# Patient Record
Sex: Male | Born: 1965 | Race: White | Hispanic: No | Smoking: Former smoker
Health system: Southern US, Community
[De-identification: ages and names within clinical notes are randomized; demographics above are authoritative.]

## PROBLEM LIST (undated history)

## (undated) DIAGNOSIS — F419 Anxiety disorder, unspecified: Secondary | ICD-10-CM

## (undated) DIAGNOSIS — M1990 Unspecified osteoarthritis, unspecified site: Secondary | ICD-10-CM

## (undated) DIAGNOSIS — J329 Chronic sinusitis, unspecified: Secondary | ICD-10-CM

## (undated) DIAGNOSIS — M542 Cervicalgia: Secondary | ICD-10-CM

## (undated) DIAGNOSIS — G8929 Other chronic pain: Secondary | ICD-10-CM

## (undated) DIAGNOSIS — K029 Dental caries, unspecified: Secondary | ICD-10-CM

## (undated) DIAGNOSIS — M549 Dorsalgia, unspecified: Secondary | ICD-10-CM

## (undated) HISTORY — PX: VASECTOMY: SHX75

## (undated) HISTORY — PX: TOOTH EXTRACTION: SUR596

---

## 2001-02-15 ENCOUNTER — Emergency Department (HOSPITAL_COMMUNITY): Admission: EM | Admit: 2001-02-15 | Discharge: 2001-02-15 | Payer: Self-pay | Admitting: Emergency Medicine

## 2001-03-31 ENCOUNTER — Emergency Department (HOSPITAL_COMMUNITY): Admission: EM | Admit: 2001-03-31 | Discharge: 2001-03-31 | Payer: Self-pay | Admitting: Emergency Medicine

## 2001-12-10 ENCOUNTER — Emergency Department (HOSPITAL_COMMUNITY): Admission: EM | Admit: 2001-12-10 | Discharge: 2001-12-10 | Payer: Self-pay | Admitting: Emergency Medicine

## 2001-12-10 ENCOUNTER — Encounter: Payer: Self-pay | Admitting: Emergency Medicine

## 2002-01-14 ENCOUNTER — Ambulatory Visit (HOSPITAL_COMMUNITY): Admission: RE | Admit: 2002-01-14 | Discharge: 2002-01-14 | Payer: Self-pay | Admitting: General Surgery

## 2002-01-14 ENCOUNTER — Encounter: Payer: Self-pay | Admitting: General Surgery

## 2003-01-21 ENCOUNTER — Encounter (HOSPITAL_COMMUNITY): Admission: RE | Admit: 2003-01-21 | Discharge: 2003-02-20 | Payer: Self-pay | Admitting: Family Medicine

## 2003-10-06 ENCOUNTER — Emergency Department (HOSPITAL_COMMUNITY): Admission: EM | Admit: 2003-10-06 | Discharge: 2003-10-06 | Payer: Self-pay | Admitting: Emergency Medicine

## 2004-07-24 ENCOUNTER — Emergency Department (HOSPITAL_COMMUNITY): Admission: EM | Admit: 2004-07-24 | Discharge: 2004-07-24 | Payer: Self-pay | Admitting: Emergency Medicine

## 2005-12-03 ENCOUNTER — Emergency Department (HOSPITAL_COMMUNITY): Admission: EM | Admit: 2005-12-03 | Discharge: 2005-12-03 | Payer: Self-pay | Admitting: Emergency Medicine

## 2007-06-15 ENCOUNTER — Emergency Department (HOSPITAL_COMMUNITY): Admission: EM | Admit: 2007-06-15 | Discharge: 2007-06-15 | Payer: Self-pay | Admitting: Emergency Medicine

## 2007-09-15 ENCOUNTER — Emergency Department (HOSPITAL_COMMUNITY): Admission: EM | Admit: 2007-09-15 | Discharge: 2007-09-15 | Payer: Self-pay | Admitting: Emergency Medicine

## 2008-03-27 ENCOUNTER — Emergency Department (HOSPITAL_COMMUNITY): Admission: EM | Admit: 2008-03-27 | Discharge: 2008-03-27 | Payer: Self-pay | Admitting: Emergency Medicine

## 2008-10-17 ENCOUNTER — Emergency Department (HOSPITAL_COMMUNITY): Admission: EM | Admit: 2008-10-17 | Discharge: 2008-10-17 | Payer: Self-pay | Admitting: Emergency Medicine

## 2009-04-13 ENCOUNTER — Emergency Department (HOSPITAL_COMMUNITY): Admission: EM | Admit: 2009-04-13 | Discharge: 2009-04-13 | Payer: Self-pay | Admitting: Emergency Medicine

## 2011-02-27 ENCOUNTER — Emergency Department (HOSPITAL_COMMUNITY)
Admission: EM | Admit: 2011-02-27 | Discharge: 2011-02-27 | Disposition: A | Discharge status: Home / Self Care | Payer: Medicare Other | Attending: Emergency Medicine | Admitting: Emergency Medicine

## 2011-02-27 ENCOUNTER — Encounter (HOSPITAL_COMMUNITY): Payer: Self-pay | Admitting: Emergency Medicine

## 2011-02-27 ENCOUNTER — Emergency Department (HOSPITAL_COMMUNITY): Payer: Medicare Other

## 2011-02-27 DIAGNOSIS — J3489 Other specified disorders of nose and nasal sinuses: Secondary | ICD-10-CM | POA: Insufficient documentation

## 2011-02-27 DIAGNOSIS — R059 Cough, unspecified: Secondary | ICD-10-CM | POA: Insufficient documentation

## 2011-02-27 DIAGNOSIS — Z79899 Other long term (current) drug therapy: Secondary | ICD-10-CM | POA: Insufficient documentation

## 2011-02-27 DIAGNOSIS — F411 Generalized anxiety disorder: Secondary | ICD-10-CM | POA: Insufficient documentation

## 2011-02-27 DIAGNOSIS — R05 Cough: Secondary | ICD-10-CM | POA: Insufficient documentation

## 2011-02-27 DIAGNOSIS — M129 Arthropathy, unspecified: Secondary | ICD-10-CM | POA: Insufficient documentation

## 2011-02-27 HISTORY — DX: Chronic sinusitis, unspecified: J32.9

## 2011-02-27 HISTORY — DX: Unspecified osteoarthritis, unspecified site: M19.90

## 2011-02-27 HISTORY — DX: Anxiety disorder, unspecified: F41.9

## 2011-02-27 MED ORDER — BENZONATATE 100 MG PO CAPS: ORAL_CAPSULE | ORAL | Status: DC

## 2011-02-27 MED ORDER — AZITHROMYCIN 250 MG PO TABS: ORAL_TABLET | ORAL | Status: DC

## 2011-02-27 MED ORDER — ALBUTEROL SULFATE HFA 108 (90 BASE) MCG/ACT IN AERS
2.0000 | INHALATION_SPRAY | Freq: Once | RESPIRATORY_TRACT | Status: AC
  Administered 2011-02-27: 2 via RESPIRATORY_TRACT
  Filled 2011-02-27: qty 6.7

## 2011-02-27 NOTE — ED Notes (Signed)
Pt c/o cough/congestion. Has green prod cough. No resp distress noted. Nad.

## 2011-02-28 NOTE — ED Provider Notes (Signed)
History     CSN: [id number]  Arrival date & time 02/27/11  4098   First MD Initiated Contact with Patient 02/27/11 1016      Chief Complaint  Patient presents with  . Cough    (Consider location/radiation/quality/duration/timing/severity/associated sxs/prior treatment) Patient is a 46 y.o. male presenting with cough. The history is provided by the patient. No language interpreter was used.  Cough This is a new problem. The current episode started more than 2 days ago. The problem occurs every few minutes. The problem has not changed since onset.The cough is productive of sputum. There has been no fever. Associated symptoms include rhinorrhea. Pertinent negatives include no chest pain, no chills, no sweats, no ear pain, no headaches, no sore throat, no myalgias, no shortness of breath and no wheezing. Associated symptoms comments: Nasal congestion. He has tried nothing for the symptoms. The treatment provided no relief. He is not a smoker. His past medical history does not include pneumonia, COPD or asthma.    Past Medical History  Diagnosis Date  . Arthritis   . Anxiety   . Sinusitis     Past Surgical History  Procedure Date  . Vasectomy     History reviewed. No pertinent family history.  History  Substance Use Topics  . Smoking status: Former Games developer  . Smokeless tobacco: Former Neurosurgeon  . Alcohol Use: No      Review of Systems  Constitutional: Negative for fever, chills and appetite change.  HENT: Positive for rhinorrhea. Negative for ear pain, sore throat and neck pain.   Respiratory: Positive for cough. Negative for chest tightness, shortness of breath and wheezing.   Cardiovascular: Negative for chest pain.  Gastrointestinal: Negative for vomiting, abdominal pain and diarrhea.  Musculoskeletal: Negative for myalgias.  Skin: Negative.   Neurological: Negative for dizziness, weakness, numbness and headaches.  All other systems reviewed and are  negative.    Allergies  Review of patient's allergies indicates not on file.  Home Medications   Current Outpatient Rx  Name Route Sig Dispense Refill  . AZITHROMYCIN 250 MG PO TABS  Take two tablets on day one, then one tab qd days 2-5 6 tablet 0  . BENZONATATE 100 MG PO CAPS  Take one po TID prn cough. Swallow whole, do not chew. 21 capsule 0    BP 130/95  Pulse 85  Temp(Src) 98.1 F (36.7 C) (Oral)  Resp 19  SpO2 100%  Physical Exam  Nursing note and vitals reviewed. Constitutional: He is oriented to person, place, and time. He appears well-developed and well-nourished. No distress.  HENT:  Head: Normocephalic and atraumatic.  Mouth/Throat: Oropharynx is clear and moist. No oropharyngeal exudate.  Neck: Normal range of motion. Neck supple.  Cardiovascular: Normal rate, regular rhythm, normal heart sounds and intact distal pulses.   No murmur heard. Pulmonary/Chest: Effort normal and breath sounds normal. No respiratory distress. He has no wheezes. He has no rales. He exhibits no tenderness.       Coarse lung sounds bilaterally.    Musculoskeletal: Normal range of motion. He exhibits no tenderness.  Lymphadenopathy:    He has no cervical adenopathy.  Neurological: He is alert and oriented to person, place, and time. He exhibits normal muscle tone. Coordination normal.  Skin: Skin is warm and dry.    ED Course  Procedures (including critical care time)  Labs Reviewed - No data to display No results found.   1. Cough       MDM  Patient is alert, NAD.  Vitals are stable, no hypoxia, or tachypnea.  Lungs sounds are coarse bilaterally w/o wheezes or rales.  Pt is non-toxic appearing.  He agrees to close f/u with his PMD for recheck or return here if sx worsen        Abbagale Goguen L. [place], [place] 02/28/11 2036

## 2011-03-01 NOTE — ED Provider Notes (Signed)
Medical screening examination/treatment/procedure(s) were performed by non-physician practitioner and as supervising physician I was immediately available for consultation/collaboration.  Yardley Lekas S. Myda Detwiler, MD 03/01/11 1546 

## 2011-06-03 ENCOUNTER — Encounter (HOSPITAL_COMMUNITY): Payer: Self-pay

## 2011-06-03 ENCOUNTER — Emergency Department (HOSPITAL_COMMUNITY)
Admission: EM | Admit: 2011-06-03 | Discharge: 2011-06-03 | Disposition: A | Discharge status: Home / Self Care | Payer: Medicare Other | Attending: Emergency Medicine | Admitting: Emergency Medicine

## 2011-06-03 DIAGNOSIS — F419 Anxiety disorder, unspecified: Secondary | ICD-10-CM

## 2011-06-03 DIAGNOSIS — F411 Generalized anxiety disorder: Secondary | ICD-10-CM | POA: Insufficient documentation

## 2011-06-03 DIAGNOSIS — Z87891 Personal history of nicotine dependence: Secondary | ICD-10-CM | POA: Insufficient documentation

## 2011-06-03 DIAGNOSIS — Z79899 Other long term (current) drug therapy: Secondary | ICD-10-CM | POA: Insufficient documentation

## 2011-06-03 DIAGNOSIS — M129 Arthropathy, unspecified: Secondary | ICD-10-CM | POA: Insufficient documentation

## 2011-06-03 MED ORDER — HYDROXYZINE HCL 25 MG PO TABS: 50.0000 mg | ORAL_TABLET | Freq: Every evening | ORAL | Status: DC | PRN

## 2011-06-03 MED ORDER — LORAZEPAM 1 MG PO TABS
1.0000 mg | ORAL_TABLET | Freq: Once | ORAL | Status: AC
  Administered 2011-06-03: 1 mg via ORAL
  Filled 2011-06-03: qty 1

## 2011-06-03 NOTE — ED Provider Notes (Signed)
History     CSN: [id number]  Arrival date & time 06/03/11  0117   First MD Initiated Contact with Patient 06/03/11 0140      Chief Complaint  Patient presents with  . Anxiety    (Consider location/radiation/quality/duration/timing/severity/associated sxs/prior treatment) HPI History provided by patient. Takes half a tablet of Xanax as needed for long-standing history of anxiety. Patient feeling more anxious tonight took a half pill of Xanax and still feeling anxious. He presents here for further evaluation. No chest pain or shortness of breath. He is under a lot of stress related to family issues. His son is in a group home he is currently trying to regain custody of him. He is very frustrated with legal system but denies any suicidal or homicidal ideations. No hallucinations. Patient does not feel like he needs to be admitted to the hospital. He denies any recent illness. He has been having some difficulty sleeping.  No self injury. No alcohol or drug use.  Past Medical History  Diagnosis Date  . Arthritis   . Anxiety   . Sinusitis     Past Surgical History  Procedure Date  . Vasectomy     No family history on file.  History  Substance Use Topics  . Smoking status: Former Games developer  . Smokeless tobacco: Former Neurosurgeon  . Alcohol Use: No      Review of Systems  Constitutional: Negative for fever and chills.  HENT: Negative for neck pain and neck stiffness.   Eyes: Negative for pain.  Respiratory: Negative for shortness of breath.   Cardiovascular: Negative for chest pain.  Gastrointestinal: Negative for abdominal pain.  Genitourinary: Negative for dysuria.  Musculoskeletal: Negative for back pain.  Skin: Negative for rash.  Neurological: Negative for headaches.  Psychiatric/Behavioral: Negative for suicidal ideas, sleep disturbance and self-injury.  All other systems reviewed and are negative.    Allergies  Codeine  Home Medications   Current Outpatient Rx    Name Route Sig Dispense Refill  . ALPRAZOLAM 0.5 MG PO TABS Oral Take 0.5 mg by mouth at bedtime as needed.    . AZITHROMYCIN 250 MG PO TABS  Take two tablets on day one, then one tab qd days 2-5 6 tablet 0  . BENZONATATE 100 MG PO CAPS  Take one po TID prn cough. Swallow whole, do not chew. 21 capsule 0    BP 121/79  Pulse 64  Temp(Src) 98 F (36.7 C) (Oral)  Resp 16  Ht 6\' 2"  (1.88 m)  Wt 178 lb (80.74 kg)  BMI 22.85 kg/m2  SpO2 97%  Physical Exam  Constitutional: He is oriented to person, place, and time. He appears well-developed and well-nourished.  HENT:  Head: Normocephalic and atraumatic.  Eyes: Conjunctivae and EOM are normal. Pupils are equal, round, and reactive to light.  Neck: Trachea normal. Neck supple. No thyromegaly present.  Cardiovascular: Normal rate, regular rhythm, S1 normal, S2 normal and normal pulses.     No systolic murmur is present   No diastolic murmur is present  Pulses:      Radial pulses are 2+ on the right side, and 2+ on the left side.  Pulmonary/Chest: Effort normal and breath sounds normal. He has no wheezes. He has no rhonchi. He has no rales. He exhibits no tenderness.  Abdominal: Soft. Normal appearance and bowel sounds are normal. There is no tenderness. There is no CVA tenderness and negative Murphy's sign.  Musculoskeletal:       BLE:s Calves  nontender, no cords or erythema, negative Homans sign  Neurological: He is alert and oriented to person, place, and time. He has normal strength. No cranial nerve deficit or sensory deficit. GCS eye subscore is 4. GCS verbal subscore is 5. GCS motor subscore is 6.  Skin: Skin is warm and dry. No rash noted. He is not diaphoretic.  Psychiatric: He has a normal mood and affect. His speech is normal and behavior is normal. Judgment and thought content normal.       Anxious. Cooperative and appropriate    ED Course  Procedures (including critical care time)  Ativan for anxiety, no SI/ HI or  psychosis  MDM   Anxiety with history of same. I had a long conversation with patient about dealing with the stressors and anxiety a home. Prescription for Atarax provided as needed for any additional anxiety at home. No indication for labs or further workup at this time. No indication for IVC. Patient declines speaking to [facility]. He has a primary care physician and agrees to all discharge and followup instructions. Stable for discharge home with improved condition       Sunnie Nielsen, MD 06/03/11 [id number]

## 2011-06-03 NOTE — Discharge Instructions (Signed)

## 2011-06-03 NOTE — ED Notes (Signed)
Pt states he has been anxious worrying about his children who are in a group home in [place].  Pt states he is not getting along with their mother and worries about the court proceedings regarding custody etc.  Pt denies SI/HI, states he needs help with his nerves

## 2011-07-09 ENCOUNTER — Emergency Department (HOSPITAL_COMMUNITY)
Admission: EM | Admit: 2011-07-09 | Discharge: 2011-07-09 | Disposition: A | Discharge status: Home / Self Care | Payer: Medicare Other | Attending: Emergency Medicine | Admitting: Emergency Medicine

## 2011-07-09 ENCOUNTER — Encounter (HOSPITAL_COMMUNITY): Payer: Self-pay

## 2011-07-09 DIAGNOSIS — F411 Generalized anxiety disorder: Secondary | ICD-10-CM | POA: Insufficient documentation

## 2011-07-09 DIAGNOSIS — S335XXA Sprain of ligaments of lumbar spine, initial encounter: Secondary | ICD-10-CM | POA: Insufficient documentation

## 2011-07-09 DIAGNOSIS — S39012A Strain of muscle, fascia and tendon of lower back, initial encounter: Secondary | ICD-10-CM

## 2011-07-09 DIAGNOSIS — J309 Allergic rhinitis, unspecified: Secondary | ICD-10-CM | POA: Insufficient documentation

## 2011-07-09 DIAGNOSIS — Z79899 Other long term (current) drug therapy: Secondary | ICD-10-CM | POA: Insufficient documentation

## 2011-07-09 DIAGNOSIS — Z87891 Personal history of nicotine dependence: Secondary | ICD-10-CM | POA: Insufficient documentation

## 2011-07-09 MED ORDER — HYDROCODONE-ACETAMINOPHEN 5-325 MG PO TABS: 1.0000 | ORAL_TABLET | Freq: Four times a day (QID) | ORAL | Status: AC | PRN

## 2011-07-09 MED ORDER — IBUPROFEN 800 MG PO TABS
800.0000 mg | ORAL_TABLET | Freq: Once | ORAL | Status: AC
  Administered 2011-07-09: 800 mg via ORAL
  Filled 2011-07-09: qty 1

## 2011-07-09 NOTE — Discharge Instructions (Signed)
Back Pain, Adult Low back pain is very common. About 1 in 5 people have back pain.The cause of low back pain is rarely dangerous. The pain often gets better over time.About half of people with a sudden onset of back pain feel better in just 2 weeks. About 8 in 10 people feel better by 6 weeks.  CAUSES Some common causes of back pain include:  Strain of the muscles or ligaments supporting the spine.   Wear and tear (degeneration) of the spinal discs.   Arthritis.   Direct injury to the back.  DIAGNOSIS Most of the time, the direct cause of low back pain is not known.However, back pain can be treated effectively even when the exact cause of the pain is unknown.Answering your caregiver's questions about your overall health and symptoms is one of the most accurate ways to make sure the cause of your pain is not dangerous. If your caregiver needs more information, he or she may order lab work or imaging tests (X-rays or MRIs).However, even if imaging tests show changes in your back, this usually does not require surgery. HOME CARE INSTRUCTIONS For many people, back pain returns.Since low back pain is rarely dangerous, it is often a condition that people can learn to manageon their own.   Remain active. It is stressful on the back to sit or stand in one place. Do not sit, drive, or stand in one place for more than 30 minutes at a time. Take short walks on level surfaces as soon as pain allows.Try to increase the length of time you walk each day.   Do not stay in bed.Resting more than 1 or 2 days can delay your recovery.   Do not avoid exercise or work.Your body is made to move.It is not dangerous to be active, even though your back may hurt.Your back will likely heal faster if you return to being active before your pain is gone.   Pay attention to your body when you bend and lift. Many people have less discomfortwhen lifting if they bend their knees, keep the load close to their  bodies,and avoid twisting. Often, the most comfortable positions are those that put less stress on your recovering back.   Find a comfortable position to sleep. Use a firm mattress and lie on your side with your knees slightly bent. If you lie on your back, put a pillow under your knees.   Only take over-the-counter or prescription medicines as directed by your caregiver. Over-the-counter medicines to reduce pain and inflammation are often the most helpful.Your caregiver may prescribe muscle relaxant drugs.These medicines help dull your pain so you can more quickly return to your normal activities and healthy exercise.   Put ice on the injured area.   Put ice in a plastic bag.   Place a towel between your skin and the bag.   Leave the ice on for 15 to 20 minutes, 3 to 4 times a day for the first 2 to 3 days. After that, ice and heat may be alternated to reduce pain and spasms.   Ask your caregiver about trying back exercises and gentle massage. This may be of some benefit.   Avoid feeling anxious or stressed.Stress increases muscle tension and can worsen back pain.It is important to recognize when you are anxious or stressed and learn ways to manage it.Exercise is a great option.  SEEK MEDICAL CARE IF:  You have pain that is not relieved with rest or medicine.   You have   pain that does not improve in 1 week.   You have new symptoms.   You are generally not feeling well.  SEEK IMMEDIATE MEDICAL CARE IF:   You have pain that radiates from your back into your legs.   You develop new bowel or bladder control problems.   You have unusual weakness or numbness in your arms or legs.   You develop nausea or vomiting.   You develop abdominal pain.   You feel faint.  Document Released: 12/27/2004 Document Revised: 12/16/2010 Document Reviewed: 05/17/2010 [facility] Patient Information 2012 [place], [place].Cryotherapy Cryotherapy means treatment with cold. Ice or gel packs can be  used to reduce both pain and swelling. Ice is the most helpful within the first 24 to 48 hours after an injury or flareup from overusing a muscle or joint. Sprains, strains, spasms, burning pain, shooting pain, and aches can all be eased with ice. Ice can also be used when recovering from surgery. Ice is effective, has very few side effects, and is safe for most people to use. PRECAUTIONS  Ice is not a safe treatment option for people with:  Raynaud's phenomenon. This is a condition affecting small blood vessels in the extremities. Exposure to cold may cause your problems to return.   Cold hypersensitivity. There are many forms of cold hypersensitivity, including:   Cold urticaria. Red, itchy hives appear on the skin when the tissues begin to warm after being iced.   Cold erythema. This is a red, itchy rash caused by exposure to cold.   Cold hemoglobinuria. Red blood cells break down when the tissues begin to warm after being iced. The hemoglobin that carry oxygen are passed into the urine because they cannot combine with blood proteins fast enough.   Numbness or altered sensitivity in the area being iced.  If you have any of the following conditions, do not use ice until you have discussed cryotherapy with your caregiver:  Heart conditions, such as arrhythmia, angina, or chronic heart disease.   High blood pressure.   Healing wounds or open skin in the area being iced.   Current infections.   Rheumatoid arthritis.   Poor circulation.   Diabetes.  Ice slows the blood flow in the region it is applied. This is beneficial when trying to stop inflamed tissues from spreading irritating chemicals to surrounding tissues. However, if you expose your skin to cold temperatures for too long or without the proper protection, you can damage your skin or nerves. Watch for signs of skin damage due to cold. HOME CARE INSTRUCTIONS Follow these tips to use ice and cold packs safely.  Place a dry or  damp towel between the ice and skin. A damp towel will cool the skin more quickly, so you may need to shorten the time that the ice is used.   For a more rapid response, add gentle compression to the ice.   Ice for no more than 10 to 20 minutes at a time. The bonier the area you are icing, the less time it will take to get the benefits of ice.   Check your skin after 5 minutes to make sure there are no signs of a poor response to cold or skin damage.   Rest 20 minutes or more in between uses.   Once your skin is numb, you can end your treatment. You can test numbness by very lightly touching your skin. The touch should be so light that you do not see the skin dimple from  the pressure of your fingertip. When using ice, most people will feel these normal sensations in this order: cold, burning, aching, and numbness.   Do not use ice on someone who cannot communicate their responses to pain, such as small children or people with dementia.  HOW TO MAKE AN ICE PACK Ice packs are the most common way to use ice therapy. Other methods include ice massage, ice baths, and cryo-sprays. Muscle creams that cause a cold, tingly feeling do not offer the same benefits that ice offers and should not be used as a substitute unless recommended by your caregiver. To make an ice pack, do one of the following:  Place crushed ice or a bag of frozen vegetables in a sealable plastic bag. Squeeze out the excess air. Place this bag inside another plastic bag. Slide the bag into a pillowcase or place a damp towel between your skin and the bag.   Mix 3 parts water with 1 part rubbing alcohol. Freeze the mixture in a sealable plastic bag. When you remove the mixture from the freezer, it will be slushy. Squeeze out the excess air. Place this bag inside another plastic bag. Slide the bag into a pillowcase or place a damp towel between your skin and the bag.  SEEK MEDICAL CARE IF:  You develop white spots on your skin. This  may give the skin a blotchy (mottled) appearance.   Your skin turns blue or pale.   Your skin becomes waxy or hard.   Your swelling gets worse.  MAKE SURE YOU:   Understand these instructions.   Will watch your condition.   Will get help right away if you are not doing well or get worse.  Document Released: 08/23/2010 Document Revised: 12/16/2010 Document Reviewed: 08/23/2010 [facility] Patient Information 2012 [place], [place].Muscle Strain A muscle strain, or pulled muscle, occurs when a muscle is over-stretched. A small number of muscle fibers may also be torn. This is especially common in athletes. This happens when a sudden violent force placed on a muscle pushes it past its capacity. Usually, recovery from a pulled muscle takes 1 to 2 weeks. But complete healing will take 5 to 6 weeks. There are millions of muscle fibers. Following injury, your body will usually return to normal quickly. HOME CARE INSTRUCTIONS   While awake, apply ice to the sore muscle for 15 to 20 minutes each hour for the first 2 days. Put ice in a plastic bag and place a towel between the bag of ice and your skin.   Do not use the pulled muscle for several days. Do not use the muscle if you have pain.   You may wrap the injured area with an elastic bandage for comfort. Be careful not to bind it too tightly. This may interfere with blood circulation.   Only take over-the-counter or prescription medicines for pain, discomfort, or fever as directed by your caregiver. Do not use aspirin as this will increase bleeding (bruising) at injury site.   Warming up before exercise helps prevent muscle strains.  SEEK MEDICAL CARE IF:  There is increased pain or swelling in the affected area. MAKE SURE YOU:   Understand these instructions.   Will watch your condition.   Will get help right away if you are not doing well or get worse.  Document Released: 12/27/2004 Document Revised: 12/16/2010 Document Reviewed:  07/26/2006 [facility] Patient Information 2012 [place], [place].   Take the pain medicine as directed.  Take ibuprofen 800 mg every 8  hrs with food.  Apply ice several times daily to lower back.  Follow up with dr. Milford Cage if symptoms not improving or if you feel you need any further pain meds.

## 2011-07-09 NOTE — ED Provider Notes (Signed)
Medical screening examination/treatment/procedure(s) were performed by non-physician practitioner and as supervising physician I was immediately available for consultation/collaboration.   Benny Lennert, MD 07/09/11 [phone]

## 2011-07-09 NOTE — ED Provider Notes (Signed)
History     CSN: [id number]  Arrival date & time 07/09/11  1907   First MD Initiated Contact with Patient 07/09/11 1920      Chief Complaint  Patient presents with  . Back Pain    (Consider location/radiation/quality/duration/timing/severity/associated sxs/prior treatment) HPI Comments: Pt has h/o chronic back pain.  "i forgot i am not supposed to lift things.  i hurt my back lifting a lawn mower".  No radiation.  Pt drove himself to the ED.  Patient is a 46 y.o. male presenting with back pain. The history is provided by the patient. No language interpreter was used.  Back Pain  This is a chronic problem. The problem occurs constantly. The problem has not changed since onset.The pain is associated with lifting heavy objects. The pain is present in the lumbar spine. The pain does not radiate. The pain is severe. The symptoms are aggravated by bending, twisting and certain positions. Pertinent negatives include no numbness, no bowel incontinence, no perianal numbness, no bladder incontinence, no leg pain, no paresthesias, no paresis, no tingling and no weakness. Treatments tried: OTC "pain reliever".  ? tylenol. The treatment provided no relief. Risk factors: h/o chronic back pain.    Past Medical History  Diagnosis Date  . Arthritis   . Anxiety   . Sinusitis   . Back pain     Past Surgical History  Procedure Date  . Vasectomy     History reviewed. No pertinent family history.  History  Substance Use Topics  . Smoking status: Former Games developer  . Smokeless tobacco: Former Neurosurgeon  . Alcohol Use: No      Review of Systems  Gastrointestinal: Negative for bowel incontinence.  Genitourinary: Negative for bladder incontinence.  Musculoskeletal: Positive for back pain.  Neurological: Negative for tingling, weakness, numbness and paresthesias.  All other systems reviewed and are negative.    Allergies  Codeine  Home Medications   Current Outpatient Rx  Name Route Sig  Dispense Refill  . ALPRAZOLAM 0.5 MG PO TABS Oral Take 0.5 mg by mouth at bedtime as needed.    . AZITHROMYCIN 250 MG PO TABS  Take two tablets on day one, then one tab qd days 2-5 6 tablet 0  . BENZONATATE 100 MG PO CAPS  Take one po TID prn cough. Swallow whole, do not chew. 21 capsule 0  . HYDROCODONE-ACETAMINOPHEN 5-325 MG PO TABS Oral Take 1 tablet by mouth every 6 (six) hours as needed for pain. 12 tablet 0    BP 113/84  Pulse 82  Temp 98.7 F (37.1 C) (Oral)  Resp 16  Ht 6\' 2"  (1.88 m)  Wt 178 lb (80.74 kg)  BMI 22.85 kg/m2  SpO2 99%  Physical Exam  Nursing note and vitals reviewed. Constitutional: He is oriented to person, place, and time. He appears well-developed and well-nourished.  HENT:  Head: Normocephalic and atraumatic.  Eyes: EOM are normal.  Neck: Normal range of motion.  Cardiovascular: Normal rate, regular rhythm, normal heart sounds and intact distal pulses.   Pulmonary/Chest: Effort normal and breath sounds normal. No respiratory distress.  Abdominal: Soft. He exhibits no distension. There is no tenderness.  Musculoskeletal: He exhibits tenderness.       Lumbar back: He exhibits decreased range of motion and tenderness. He exhibits no bony tenderness, no swelling, no deformity and no laceration.       Back:  Neurological: He is alert and oriented to person, place, and time. He displays normal reflexes. Coordination  normal.  Skin: Skin is warm and dry.  Psychiatric: He has a normal mood and affect. Judgment normal.    ED Course  Procedures (including critical care time)  Labs Reviewed - No data to display No results found.   1. Lumbar strain       MDM  rx- hydrocodone, 12 OTC ibuprofen 800 mg TID Ice.  F/u with PCP prn.        Evalina Field, [place] 07/09/11 1940

## 2011-07-09 NOTE — ED Notes (Signed)
Hurt my back yesterday and I can't get the pain to go away. Hurting in lower back per pt.

## 2011-09-18 ENCOUNTER — Emergency Department (HOSPITAL_COMMUNITY)
Admission: EM | Admit: 2011-09-18 | Discharge: 2011-09-18 | Disposition: A | Discharge status: Home / Self Care | Payer: Medicare Other | Attending: Emergency Medicine | Admitting: Emergency Medicine

## 2011-09-18 ENCOUNTER — Encounter (HOSPITAL_COMMUNITY): Payer: Self-pay | Admitting: *Deleted

## 2011-09-18 ENCOUNTER — Emergency Department (HOSPITAL_COMMUNITY): Payer: Medicare Other

## 2011-09-18 DIAGNOSIS — N451 Epididymitis: Secondary | ICD-10-CM

## 2011-09-18 DIAGNOSIS — N453 Epididymo-orchitis: Secondary | ICD-10-CM | POA: Insufficient documentation

## 2011-09-18 DIAGNOSIS — R110 Nausea: Secondary | ICD-10-CM | POA: Insufficient documentation

## 2011-09-18 DIAGNOSIS — N509 Disorder of male genital organs, unspecified: Secondary | ICD-10-CM | POA: Insufficient documentation

## 2011-09-18 DIAGNOSIS — R109 Unspecified abdominal pain: Secondary | ICD-10-CM | POA: Insufficient documentation

## 2011-09-18 LAB — URINALYSIS, ROUTINE W REFLEX MICROSCOPIC
Bilirubin Urine: NEGATIVE
Glucose, UA: NEGATIVE mg/dL
Hgb urine dipstick: NEGATIVE
Specific Gravity, Urine: 1.02 (ref 1.005–1.030)
Urobilinogen, UA: 0.2 mg/dL (ref 0.0–1.0)
pH: 7 (ref 5.0–8.0)

## 2011-09-18 LAB — CBC
HCT: 38.9 % — ABNORMAL LOW (ref 39.0–52.0)
Hemoglobin: 13.3 g/dL (ref 13.0–17.0)
MCH: 29.8 pg (ref 26.0–34.0)
MCHC: 34.2 g/dL (ref 30.0–36.0)
MCV: 87 fL (ref 78.0–100.0)
Platelets: 151 10*3/uL (ref 150–400)
RBC: 4.47 MIL/uL (ref 4.22–5.81)
RDW: 13.1 % (ref 11.5–15.5)
WBC: 7.8 10*3/uL (ref 4.0–10.5)

## 2011-09-18 LAB — BASIC METABOLIC PANEL
BUN: 11 mg/dL (ref 6–23)
CO2: 29 mEq/L (ref 19–32)
Calcium: 8.9 mg/dL (ref 8.4–10.5)
Chloride: 102 mEq/L (ref 96–112)
Creatinine, Ser: 1.13 mg/dL (ref 0.50–1.35)
GFR calc Af Amer: 88 mL/min — ABNORMAL LOW (ref >90)
GFR calc non Af Amer: 76 mL/min — ABNORMAL LOW (ref >90)
Glucose, Bld: 116 mg/dL — ABNORMAL HIGH (ref 70–99)
Potassium: 3.9 mEq/L (ref 3.5–5.1)
Sodium: 136 mEq/L (ref 135–145)

## 2011-09-18 MED ORDER — LIDOCAINE HCL (PF) 1 % IJ SOLN
INTRAMUSCULAR | Status: AC
  Administered 2011-09-18: 5 mL
  Filled 2011-09-18: qty 5

## 2011-09-18 MED ORDER — KETOROLAC TROMETHAMINE 30 MG/ML IJ SOLN
30.0000 mg | Freq: Once | INTRAMUSCULAR | Status: DC
  Filled 2011-09-18: qty 1

## 2011-09-18 MED ORDER — SODIUM CHLORIDE 0.9 % IV BOLUS (SEPSIS)
1000.0000 mL | Freq: Once | INTRAVENOUS | Status: AC
  Administered 2011-09-18: 1000 mL via INTRAVENOUS

## 2011-09-18 MED ORDER — HYDROMORPHONE HCL PF 1 MG/ML IJ SOLN
1.0000 mg | Freq: Once | INTRAMUSCULAR | Status: DC
  Filled 2011-09-18: qty 1

## 2011-09-18 MED ORDER — DOXYCYCLINE HYCLATE 100 MG PO CAPS: 100.0000 mg | ORAL_CAPSULE | Freq: Two times a day (BID) | ORAL | Status: AC

## 2011-09-18 MED ORDER — CEFTRIAXONE SODIUM 250 MG IJ SOLR: 250.0000 mg | Freq: Once | INTRAMUSCULAR | Status: DC

## 2011-09-18 MED ORDER — ONDANSETRON HCL 4 MG/2ML IJ SOLN
4.0000 mg | Freq: Once | INTRAMUSCULAR | Status: DC
  Filled 2011-09-18: qty 2

## 2011-09-18 MED ORDER — DOXYCYCLINE HYCLATE 100 MG PO TABS
100.0000 mg | ORAL_TABLET | Freq: Once | ORAL | Status: AC
  Administered 2011-09-18: 100 mg via ORAL
  Filled 2011-09-18: qty 1

## 2011-09-18 MED ORDER — CEFTRIAXONE SODIUM 250 MG IJ SOLR
250.0000 mg | Freq: Once | INTRAMUSCULAR | Status: AC
  Administered 2011-09-18: 250 mg via INTRAMUSCULAR
  Filled 2011-09-18: qty 250

## 2011-09-18 NOTE — ED Provider Notes (Signed)
History    46 year old male with abdominal and testicular pain. Right lower quadrant and right testicle. Gradual onset about 3 days ago. Constant and progressively worsening. Worse with movement. No urinary complaints. Denies trauma. Mild nausea. No vomiting. No fevers or chills. No urinary complaints. No penile discharge. No history of similar symptoms. CSN: [id number]  Arrival date & time 09/18/11  1626   First MD Initiated Contact with Patient 09/18/11 1728      Chief Complaint  Patient presents with  . Abdominal Pain  . Groin Pain    (Consider location/radiation/quality/duration/timing/severity/associated sxs/prior treatment) HPI  Past Medical History  Diagnosis Date  . Arthritis   . Anxiety   . Sinusitis   . Back pain     Past Surgical History  Procedure Date  . Vasectomy     No family history on file.  History  Substance Use Topics  . Smoking status: Former Games developer  . Smokeless tobacco: Former Neurosurgeon  . Alcohol Use: No      Review of Systems   Review of symptoms negative unless otherwise noted in HPI.   Allergies  Codeine  Home Medications   Current Outpatient Rx  Name Route Sig Dispense Refill  . ALPRAZOLAM 0.5 MG PO TABS Oral Take 0.5 mg by mouth at bedtime as needed.    . AZITHROMYCIN 250 MG PO TABS  Take two tablets on day one, then one tab qd days 2-5 6 tablet 0  . BENZONATATE 100 MG PO CAPS  Take one po TID prn cough. Swallow whole, do not chew. 21 capsule 0    BP 128/84  Pulse 83  Temp 98.6 F (37 C)  Resp 18  Ht 6\' 2"  (1.88 m)  Wt 176 lb (79.833 kg)  BMI 22.60 kg/m2  SpO2 100%  Physical Exam  Nursing note and vitals reviewed. Constitutional: He appears well-developed and well-nourished. No distress.  HENT:  Head: Normocephalic and atraumatic.  Eyes: Conjunctivae normal are normal. Right eye exhibits no discharge. Left eye exhibits no discharge.  Neck: Neck supple.  Cardiovascular: Normal rate, regular rhythm and normal heart  sounds.  Exam reveals no gallop and no friction rub.   No murmur heard. Pulmonary/Chest: Effort normal and breath sounds normal. No respiratory distress.  Abdominal: Soft. He exhibits no distension. There is no tenderness.  Genitourinary:       Normal external male genitalia. Circumcised. No external lesions noted. Right epididymis appears to be swollen and very tender to palpation. No hernia appreciated. Difficult to assess cremasseteric reflex on either side because patient continually moving. No CVA tenderness  Musculoskeletal: He exhibits no edema and no tenderness.  Neurological: He is alert.  Skin: Skin is warm and dry.  Psychiatric: His behavior is normal. Thought content normal.    ED Course  Procedures (including critical care time)  Labs Reviewed  CBC - Abnormal; Notable for the following:    HCT 38.9 (*)     All other components within normal limits  BASIC METABOLIC PANEL - Abnormal; Notable for the following:    Glucose, Bld 116 (*)     GFR calc non Af Amer 76 (*)     GFR calc Af Amer 88 (*)     All other components within normal limits  URINALYSIS, ROUTINE W REFLEX MICROSCOPIC  LAB REPORT - SCANNED   No results found.  US Scrotum  09/18/2011  *RADIOLOGY REPORT*  Clinical Data:  Groin pain.  46 year old patient.  SCROTAL ULTRASOUND DOPPLER ULTRASOUND OF THE TESTICLES  Technique: Complete ultrasound examination of the testicles, epididymis, and other scrotal structures was performed.  Color and spectral Doppler ultrasound were also utilized to evaluate blood flow to the testicles.  Comparison:  None  Findings:  Right testis:  Measures 4.1 x 1.9 x 2.7 cm.  Normal in echogenicity.  No evidence of mass or microlithiasis.  Color Doppler flow is seen throughout the right testis. Arterial and venous waveforms are seen within the right testis on Doppler imaging.  Left testis:  Measures 3.8 x 2.0 x 2.7 cm and is normal in echogenicity.  No evidence of mass or microlithiasis.  Color  Doppler flow is seen throughout the left testis and appears symmetric to the fluid in the right testis. Arterial and venous waveforms are seen within the left testis on Doppler imaging.  Right epididymis:  The right epididymis is asymmetrically prominent in size and demonstrates prominent vascular flow, compatible with acute epididymitis.  Left epididymis:  Normal in size and appearance.  Hydrocele:  Absent  Varicocele:  Absent  Pulsed Doppler interrogation of both testes demonstrates low resistance flow bilaterally.  IMPRESSION:  1.  Ultrasound findings compatible with right epididymitis. 2.  Normal sonographic appearance of the testes.  No evidence of testicular torsion.   Original Report Authenticated By: Britta Mccreedy, M.D.    [place] Art/ven Flow Abd Pelv Doppler  09/18/2011  *RADIOLOGY REPORT*  Clinical Data:  Groin pain.  46 year old patient.  SCROTAL ULTRASOUND DOPPLER ULTRASOUND OF THE TESTICLES  Technique: Complete ultrasound examination of the testicles, epididymis, and other scrotal structures was performed.  Color and spectral Doppler ultrasound were also utilized to evaluate blood flow to the testicles.  Comparison:  None  Findings:  Right testis:  Measures 4.1 x 1.9 x 2.7 cm.  Normal in echogenicity.  No evidence of mass or microlithiasis.  Color Doppler flow is seen throughout the right testis. Arterial and venous waveforms are seen within the right testis on Doppler imaging.  Left testis:  Measures 3.8 x 2.0 x 2.7 cm and is normal in echogenicity.  No evidence of mass or microlithiasis.  Color Doppler flow is seen throughout the left testis and appears symmetric to the fluid in the right testis. Arterial and venous waveforms are seen within the left testis on Doppler imaging.  Right epididymis:  The right epididymis is asymmetrically prominent in size and demonstrates prominent vascular flow, compatible with acute epididymitis.  Left epididymis:  Normal in size and appearance.  Hydrocele:  Absent   Varicocele:  Absent  Pulsed Doppler interrogation of both testes demonstrates low resistance flow bilaterally.  IMPRESSION:  1.  Ultrasound findings compatible with right epididymitis. 2.  Normal sonographic appearance of the testes.  No evidence of testicular torsion.   Original Report Authenticated By: Britta Mccreedy, M.D.     1. Epididymitis       MDM  46 year old male with right testicular pain. Exam and ultrasound consistent with epididymitis. Patient received a dose of Rocephin in the ED. Antibiotics were continued treatment. Return precautions were discussed. Outpatient followup as needed otherwise.        Raeford Razor, MD 09/22/11 [phone]

## 2011-09-18 NOTE — ED Notes (Signed)
Right lower quad abd and groin pain for the past three days, denies any n/v, fever.

## 2011-09-18 NOTE — ED Notes (Signed)
Pt reports pain to his rt side and groin area.  Pt reports "i think i pulled something".  Pt reports lifting a lawn mower out of a hole on Friday.  Pt reports that the pain worsens with movement.

## 2011-11-11 ENCOUNTER — Ambulatory Visit (INDEPENDENT_AMBULATORY_CARE_PROVIDER_SITE_OTHER): Payer: Medicare Other | Admitting: Family Medicine

## 2011-11-11 ENCOUNTER — Encounter: Payer: Self-pay | Admitting: Family Medicine

## 2011-11-11 VITALS — BP 110/80 | HR 93 | Resp 15 | Ht 74.0 in | Wt 183.0 lb

## 2011-11-11 DIAGNOSIS — J302 Other seasonal allergic rhinitis: Secondary | ICD-10-CM

## 2011-11-11 DIAGNOSIS — R51 Headache: Secondary | ICD-10-CM

## 2011-11-11 DIAGNOSIS — M129 Arthropathy, unspecified: Secondary | ICD-10-CM

## 2011-11-11 DIAGNOSIS — F419 Anxiety disorder, unspecified: Secondary | ICD-10-CM

## 2011-11-11 DIAGNOSIS — M1990 Unspecified osteoarthritis, unspecified site: Secondary | ICD-10-CM

## 2011-11-11 DIAGNOSIS — J309 Allergic rhinitis, unspecified: Secondary | ICD-10-CM

## 2011-11-11 DIAGNOSIS — F411 Generalized anxiety disorder: Secondary | ICD-10-CM

## 2011-11-11 MED ORDER — LORATADINE 10 MG PO TABS: 10.0000 mg | ORAL_TABLET | Freq: Every day | ORAL | Status: DC

## 2011-11-11 MED ORDER — ALPRAZOLAM 1 MG PO TABS: 1.0000 mg | ORAL_TABLET | Freq: Two times a day (BID) | ORAL | Status: DC | PRN
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
## 2011-11-11 NOTE — Patient Instructions (Addendum)
Xanax refilled Recommend multivitamin  You can take Tylenol Extra for headache  Avoid the BC powders and goody powders  Try the claritin for allergies  F/U 4 months

## 2011-11-13 ENCOUNTER — Encounter: Payer: Self-pay | Admitting: Family Medicine

## 2011-11-13 DIAGNOSIS — J302 Other seasonal allergic rhinitis: Secondary | ICD-10-CM | POA: Insufficient documentation

## 2011-11-13 DIAGNOSIS — F418 Other specified anxiety disorders: Secondary | ICD-10-CM | POA: Insufficient documentation

## 2011-11-13 DIAGNOSIS — R51 Headache: Secondary | ICD-10-CM | POA: Insufficient documentation

## 2011-11-13 DIAGNOSIS — M1990 Unspecified osteoarthritis, unspecified site: Secondary | ICD-10-CM | POA: Insufficient documentation

## 2011-11-13 DIAGNOSIS — R519 Headache, unspecified: Secondary | ICD-10-CM | POA: Insufficient documentation

## 2011-11-13 NOTE — Assessment & Plan Note (Signed)
Mild HA, Acetaminophen to be used

## 2011-11-13 NOTE — Assessment & Plan Note (Signed)
Prn use of OTC meds advised against use of BC/Goody powders

## 2011-11-13 NOTE — Assessment & Plan Note (Signed)
Advised claritin

## 2011-11-13 NOTE — Assessment & Plan Note (Signed)
Refilled xanax #60, obtain records

## 2011-11-22 ENCOUNTER — Telehealth: Payer: Self-pay | Admitting: Pediatrics
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
## 2011-11-22 NOTE — Telephone Encounter (Signed)
Patient aware and states he will try to get a ride to bring the report

## 2011-11-23 ENCOUNTER — Telehealth: Payer: Self-pay
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
## 2011-11-24 ENCOUNTER — Telehealth: Payer: Self-pay | Admitting: Pediatrics

## 2011-11-24 MED ORDER — ALPRAZOLAM 1 MG PO TABS: 1.0000 mg | ORAL_TABLET | Freq: Two times a day (BID) | ORAL | Status: DC | PRN

## 2011-11-24 NOTE — Telephone Encounter (Signed)
The Dr still has the last message I sent her regarding this issue. Will call patient back when she responds
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
## 2011-11-24 NOTE — Telephone Encounter (Signed)
Patient called back and Dr. Jeanice Lim stated to tell him to come get the rx and patient is aware

## 2012-01-31 ENCOUNTER — Encounter: Payer: Self-pay | Admitting: Family Medicine

## 2012-01-31 ENCOUNTER — Ambulatory Visit (INDEPENDENT_AMBULATORY_CARE_PROVIDER_SITE_OTHER): Payer: Medicare Other | Admitting: Family Medicine

## 2012-01-31 VITALS — BP 110/78 | HR 70 | Resp 16 | Ht 74.0 in | Wt 183.0 lb

## 2012-01-31 DIAGNOSIS — M129 Arthropathy, unspecified: Secondary | ICD-10-CM

## 2012-01-31 DIAGNOSIS — F411 Generalized anxiety disorder: Secondary | ICD-10-CM

## 2012-01-31 DIAGNOSIS — F419 Anxiety disorder, unspecified: Secondary | ICD-10-CM

## 2012-01-31 DIAGNOSIS — M1990 Unspecified osteoarthritis, unspecified site: Secondary | ICD-10-CM

## 2012-01-31 MED ORDER — MELOXICAM 7.5 MG PO TABS: 7.5000 mg | ORAL_TABLET | Freq: Every day | ORAL | Status: DC

## 2012-01-31 MED ORDER — ALPRAZOLAM 1 MG PO TABS: 1.0000 mg | ORAL_TABLET | Freq: Two times a day (BID) | ORAL | Status: DC | PRN

## 2012-01-31 MED ORDER — HYDROXYZINE HCL 25 MG PO TABS: 25.0000 mg | ORAL_TABLET | Freq: Every evening | ORAL | Status: AC | PRN

## 2012-01-31 MED ORDER — TRAMADOL HCL 50 MG PO TABS: 50.0000 mg | ORAL_TABLET | Freq: Three times a day (TID) | ORAL | Status: DC | PRN

## 2012-01-31 NOTE — Assessment & Plan Note (Signed)
Restart hydroxyzine at bedtime continue Xanax as needed

## 2012-01-31 NOTE — Patient Instructions (Signed)
Change appt in March to CPE ( 30 minute slot needed) Start tramadol take three times a day as needed for pain Vistaril for anxiety as directed Meloxicam once a day for inflammation in back Do not eat before your appointment in march
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
## 2012-01-31 NOTE — Assessment & Plan Note (Signed)
Will start him on meloxicam and tramadol 3 times a day as needed for

## 2012-02-04 ENCOUNTER — Emergency Department (HOSPITAL_COMMUNITY)
Admission: EM | Admit: 2012-02-04 | Discharge: 2012-02-04 | Disposition: A | Discharge status: Home / Self Care | Payer: Medicare Other | Attending: Emergency Medicine | Admitting: Emergency Medicine

## 2012-02-04 ENCOUNTER — Encounter (HOSPITAL_COMMUNITY): Payer: Self-pay | Admitting: *Deleted

## 2012-02-04 DIAGNOSIS — Z87891 Personal history of nicotine dependence: Secondary | ICD-10-CM | POA: Insufficient documentation

## 2012-02-04 DIAGNOSIS — Z8739 Personal history of other diseases of the musculoskeletal system and connective tissue: Secondary | ICD-10-CM | POA: Insufficient documentation

## 2012-02-04 DIAGNOSIS — G8929 Other chronic pain: Secondary | ICD-10-CM

## 2012-02-04 DIAGNOSIS — M549 Dorsalgia, unspecified: Secondary | ICD-10-CM | POA: Insufficient documentation

## 2012-02-04 DIAGNOSIS — Z79899 Other long term (current) drug therapy: Secondary | ICD-10-CM | POA: Insufficient documentation

## 2012-02-04 DIAGNOSIS — Z8709 Personal history of other diseases of the respiratory system: Secondary | ICD-10-CM | POA: Insufficient documentation

## 2012-02-04 DIAGNOSIS — F411 Generalized anxiety disorder: Secondary | ICD-10-CM | POA: Insufficient documentation

## 2012-02-04 DIAGNOSIS — M542 Cervicalgia: Secondary | ICD-10-CM | POA: Insufficient documentation

## 2012-02-04 MED ORDER — KETOROLAC TROMETHAMINE 60 MG/2ML IM SOLN
60.0000 mg | Freq: Once | INTRAMUSCULAR | Status: AC
  Administered 2012-02-04: 60 mg via INTRAMUSCULAR
  Filled 2012-02-04: qty 2

## 2012-02-04 NOTE — ED Provider Notes (Signed)
History   This chart was scribed for Jacob Mehrer B. Bernette Mayers, MD by Jacob Reed, ED Scribe. The patient was seen in room APA11/APA11. Patient's care was started at 1437.  CSN: [id number]  Arrival date & time 02/04/12  1437   First MD Initiated Contact with Patient 02/04/12 1505      Chief Complaint  Patient presents with  . Hypertension    HPI  Jacob Reed is a 47 y.o. male with h/o arthritis, back pain, and anxiety, who presents to the Emergency Department complaining of 5 days of chronic, progressive, worsening, moderate neck and thoracic back pain. Pain is moderate and consistent with Pt's h/o back pain. No injury mechanism denoted. Pt is currently being treated by Dr. Jeanice Lim and taking Rx 25 mg Hydroxyzine, 50 mg Tramadol, and  1 mg Xanax. Pt reports no improvement to pain despite the use of Rx. No numbness, weakness, fever, chills, chest pain, SOB, or n/v/d. Pt admits snuff tobacco use, denying alcohol and illicit drugs.   Past Medical History  Diagnosis Date  . Arthritis   . Anxiety   . Sinusitis   . Back pain     Past Surgical History  Procedure Date  . Vasectomy     Family History  Problem Relation Age of Onset  . Heart disease Father     History  Substance Use Topics  . Smoking status: Former Games developer  . Smokeless tobacco: Current User    Types: Snuff  . Alcohol Use: No      Review of Systems  HENT: Positive for neck pain.   Gastrointestinal: Negative for diarrhea, constipation and anal bleeding.  Genitourinary: Negative for dysuria.  Musculoskeletal: Positive for back pain.  Neurological: Negative for weakness and numbness.  All other systems reviewed and are negative.    Allergies  Codeine and Toradol  Home Medications   Current Outpatient Rx  Name  Route  Sig  Dispense  Refill  . ALPRAZOLAM 1 MG PO TABS   Oral   Take 1 tablet (1 mg total) by mouth 2 (two) times daily as needed for sleep.   40 tablet   2   . HYDROXYZINE HCL 25 MG PO TABS  Oral   Take 1 tablet (25 mg total) by mouth at bedtime as needed for anxiety.   30 tablet   2   . LORATADINE 10 MG PO TABS   Oral   Take 1 tablet (10 mg total) by mouth daily.   30 tablet   2   . MELOXICAM 7.5 MG PO TABS   Oral   Take 1 tablet (7.5 mg total) by mouth daily.   30 tablet   1   . TRAMADOL HCL 50 MG PO TABS   Oral   Take 1 tablet (50 mg total) by mouth 3 (three) times daily as needed for pain.   60 tablet   2     BP 137/99  Pulse 91  Temp 97.4 F (36.3 C) (Oral)  Resp 20  Ht 6\' 2"  (1.88 m)  Wt 183 lb (83.008 kg)  BMI 23.50 kg/m2  SpO2 100%  Physical Exam  Nursing note and vitals reviewed. Constitutional: He is oriented to person, place, and time. He appears well-developed and well-nourished.  HENT:  Head: Normocephalic and atraumatic.  Eyes: EOM are normal. Pupils are equal, round, and reactive to light.  Neck: Normal range of motion. Neck supple.  Cardiovascular: Normal rate, normal heart sounds and intact distal pulses.   Pulmonary/Chest:  Effort normal and breath sounds normal.  Abdominal: Bowel sounds are normal. He exhibits no distension. There is no tenderness.  Musculoskeletal: Normal range of motion. He exhibits no edema and no tenderness.       Tenderness of neck and upper back. No bony tenderness.  Neurological: He is alert and oriented to person, place, and time. He has normal strength. No cranial nerve deficit or sensory deficit.  Skin: Skin is warm and dry. No rash noted.  Psychiatric: He has a normal mood and affect.    ED Course  Procedures DIAGNOSTIC STUDIES: Oxygen Saturation is 100% on room air, normal by my interpretation.    COORDINATION OF CARE: 15:06- Evaluated Pt. Pt is awake, alert, and without distress. 15:12- Patient understand and agree with initial ED impression and plan with expectations set for ED visit    Labs Reviewed - No data to display No results found.   No diagnosis found.    MDM  Multiple chronic  complaints. Given IM Toradol, advised to see PCP.       I personally performed the services described in this documentation, which was scribed in my presence. The recorded information has been reviewed and is accurate.     Susannah Carbin B. Bernette Mayers, MD 02/04/12 1929
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
## 2012-02-04 NOTE — ED Notes (Signed)
Pt c/o back and shoulder pain which he states is a chronic problem. Pt's BP was increased on EMS arrival and pt states "I was feeling swimmy headed earlier". Pt states that has now subsided.

## 2012-03-16 ENCOUNTER — Encounter: Payer: Medicare Other | Admitting: Family Medicine

## 2012-03-22 ENCOUNTER — Ambulatory Visit (INDEPENDENT_AMBULATORY_CARE_PROVIDER_SITE_OTHER): Payer: Medicare Other | Admitting: Family Medicine

## 2012-03-22 ENCOUNTER — Encounter: Payer: Self-pay | Admitting: Family Medicine

## 2012-03-22 VITALS — BP 120/82 | HR 82 | Resp 16 | Ht 74.0 in | Wt 184.4 lb

## 2012-03-22 DIAGNOSIS — R5381 Other malaise: Secondary | ICD-10-CM

## 2012-03-22 DIAGNOSIS — R5383 Other fatigue: Secondary | ICD-10-CM

## 2012-03-22 DIAGNOSIS — Z0000 Encounter for general adult medical examination without abnormal findings: Secondary | ICD-10-CM

## 2012-03-22 DIAGNOSIS — Z125 Encounter for screening for malignant neoplasm of prostate: Secondary | ICD-10-CM

## 2012-03-22 DIAGNOSIS — Z13220 Encounter for screening for lipoid disorders: Secondary | ICD-10-CM

## 2012-03-22 LAB — CBC WITH DIFFERENTIAL/PLATELET
Eosinophils Absolute: 0.1 10*3/uL (ref 0.0–0.7)
Eosinophils Relative: 1 % (ref 0–5)
HCT: 42.1 % (ref 39.0–52.0)
Lymphocytes Relative: 21 % (ref 12–46)
Lymphs Abs: 1.4 10*3/uL (ref 0.7–4.0)
MCH: 28.4 pg (ref 26.0–34.0)
MCV: 83 fL (ref 78.0–100.0)
Monocytes Absolute: 0.4 10*3/uL (ref 0.1–1.0)
RBC: 5.07 MIL/uL (ref 4.22–5.81)
RDW: 13.9 % (ref 11.5–15.5)
WBC: 6.8 10*3/uL (ref 4.0–10.5)

## 2012-03-22 LAB — COMPREHENSIVE METABOLIC PANEL WITH GFR
ALT: 26 U/L (ref 0–53)
AST: 21 U/L (ref 0–37)
Albumin: 4.5 g/dL (ref 3.5–5.2)
Alkaline Phosphatase: 91 U/L (ref 39–117)
BUN: 11 mg/dL (ref 6–23)
CO2: 30 meq/L (ref 19–32)
Calcium: 9.2 mg/dL (ref 8.4–10.5)
Chloride: 104 meq/L (ref 96–112)
Creat: 1.24 mg/dL (ref 0.50–1.35)
Glucose, Bld: 99 mg/dL (ref 70–99)
Potassium: 4.8 meq/L (ref 3.5–5.3)
Sodium: 139 meq/L (ref 135–145)
Total Bilirubin: 0.7 mg/dL (ref 0.3–1.2)
Total Protein: 7.1 g/dL (ref 6.0–8.3)

## 2012-03-22 LAB — LIPID PANEL
Cholesterol: 146 mg/dL (ref 0–200)
HDL: 44 mg/dL
LDL Cholesterol: 83 mg/dL (ref 0–99)
Total CHOL/HDL Ratio: 3.3 ratio
Triglycerides: 95 mg/dL
VLDL: 19 mg/dL (ref 0–40)
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
## 2012-03-22 NOTE — Patient Instructions (Signed)
I recommend eye visit once a year I recommend dental visit every 6 months Goal is to  Exercise 30 minutes 5 days a week We will send a letter with lab results  Continue current medications F/U 4 months

## 2012-03-23 LAB — PSA, MEDICARE: PSA: 1.82 ng/mL (ref <=4.00)

## 2012-04-02 ENCOUNTER — Other Ambulatory Visit: Payer: Self-pay

## 2012-04-02 ENCOUNTER — Telehealth: Payer: Self-pay | Admitting: Family Medicine

## 2012-04-02 MED ORDER — ALPRAZOLAM 1 MG PO TABS: 1.0000 mg | ORAL_TABLET | Freq: Two times a day (BID) | ORAL | Status: DC | PRN

## 2012-04-02 NOTE — Telephone Encounter (Signed)
Med reordered.

## 2012-04-12 ENCOUNTER — Emergency Department (HOSPITAL_COMMUNITY)
Admission: EM | Admit: 2012-04-12 | Discharge: 2012-04-12 | Disposition: A | Discharge status: Home / Self Care | Payer: Medicare Other | Attending: Emergency Medicine | Admitting: Emergency Medicine

## 2012-04-12 ENCOUNTER — Encounter (HOSPITAL_COMMUNITY): Payer: Self-pay | Admitting: Emergency Medicine

## 2012-04-12 DIAGNOSIS — Z79899 Other long term (current) drug therapy: Secondary | ICD-10-CM | POA: Insufficient documentation

## 2012-04-12 DIAGNOSIS — R112 Nausea with vomiting, unspecified: Secondary | ICD-10-CM | POA: Insufficient documentation

## 2012-04-12 DIAGNOSIS — F411 Generalized anxiety disorder: Secondary | ICD-10-CM | POA: Insufficient documentation

## 2012-04-12 DIAGNOSIS — Z87891 Personal history of nicotine dependence: Secondary | ICD-10-CM | POA: Insufficient documentation

## 2012-04-12 DIAGNOSIS — M129 Arthropathy, unspecified: Secondary | ICD-10-CM | POA: Insufficient documentation

## 2012-04-12 DIAGNOSIS — Z8709 Personal history of other diseases of the respiratory system: Secondary | ICD-10-CM | POA: Insufficient documentation

## 2012-04-12 DIAGNOSIS — R197 Diarrhea, unspecified: Secondary | ICD-10-CM | POA: Insufficient documentation

## 2012-04-12 MED ORDER — SODIUM CHLORIDE 0.9 % IV BOLUS (SEPSIS)
1000.0000 mL | Freq: Once | INTRAVENOUS | Status: AC
  Administered 2012-04-12: 1000 mL via INTRAVENOUS

## 2012-04-12 MED ORDER — ONDANSETRON HCL 4 MG/2ML IJ SOLN
4.0000 mg | Freq: Once | INTRAMUSCULAR | Status: AC
  Administered 2012-04-12: 4 mg via INTRAVENOUS
  Filled 2012-04-12: qty 2

## 2012-04-12 MED ORDER — ONDANSETRON 4 MG PO TBDP: 4.0000 mg | ORAL_TABLET | Freq: Three times a day (TID) | ORAL | Status: DC | PRN

## 2012-04-12 NOTE — ED Provider Notes (Signed)
History     CSN: [id number]  Arrival date & time 04/12/12  0124   First MD Initiated Contact with Patient 04/12/12 0142      Chief Complaint  Patient presents with  . Emesis  . Nausea    (Consider location/radiation/quality/duration/timing/severity/associated sxs/prior treatment) HPI Jacob Reed is a 47 y.o. male who presents to the Emergency Department complaining of nausea, vomiting and diarrhea that began after eating some food brought over by a friend. He went to bed at 10 PM and woke up with vomiting and diarrhea. Denies fever, chills, cough, chest pain, shortness of breath.  PCP Dr. Jeanice Lim  Past Medical History  Diagnosis Date  . Arthritis   . Anxiety   . Sinusitis   . Back pain     Past Surgical History  Procedure Laterality Date  . Vasectomy      Family History  Problem Relation Age of Onset  . Heart disease Father     History  Substance Use Topics  . Smoking status: Former Games developer  . Smokeless tobacco: Current User    Types: Snuff  . Alcohol Use: No      Review of Systems  Constitutional: Negative for fever.       10 Systems reviewed and are negative for acute change except as noted in the HPI.  HENT: Negative for congestion.   Eyes: Negative for discharge and redness.  Respiratory: Negative for cough and shortness of breath.   Cardiovascular: Negative for chest pain.  Gastrointestinal: Positive for nausea, vomiting and diarrhea. Negative for abdominal pain.  Musculoskeletal: Negative for back pain.  Skin: Negative for rash.  Neurological: Negative for syncope, numbness and headaches.  Psychiatric/Behavioral:       No behavior change.    Allergies  Codeine  Home Medications   Current Outpatient Rx  Name  Route  Sig  Dispense  Refill  . ALPRAZolam (XANAX) 1 MG tablet   Oral   Take 1 tablet (1 mg total) by mouth 2 (two) times daily as needed for sleep.   40 tablet   2   . loratadine (CLARITIN) 10 MG tablet   Oral   Take 1 tablet (10  mg total) by mouth daily.   30 tablet   2   . meloxicam (MOBIC) 7.5 MG tablet   Oral   Take 1 tablet (7.5 mg total) by mouth daily.   30 tablet   1   . traMADol (ULTRAM) 50 MG tablet   Oral   Take 1 tablet (50 mg total) by mouth 3 (three) times daily as needed for pain.   60 tablet   2     BP 126/82  Pulse 90  Temp(Src) 97.9 F (36.6 C) (Oral)  Resp 18  Ht 6\' 2"  (1.88 m)  Wt 184 lb (83.462 kg)  BMI 23.61 kg/m2  SpO2 100%  Physical Exam  Nursing note and vitals reviewed. Constitutional: He appears well-developed and well-nourished.  Awake, alert, nontoxic appearance.  HENT:  Head: Normocephalic and atraumatic.  Eyes: EOM are normal. Pupils are equal, round, and reactive to light. Right eye exhibits no discharge. Left eye exhibits no discharge.  Neck: Neck supple.  Cardiovascular: Normal rate and normal heart sounds.   Pulmonary/Chest: Effort normal and breath sounds normal. He exhibits no tenderness.  Abdominal: Soft. Bowel sounds are normal. There is no tenderness. There is no rebound.  Musculoskeletal: He exhibits no tenderness.  Baseline ROM, no obvious new focal weakness.  Neurological:  Mental status  and motor strength appears baseline for patient and situation.  Skin: No rash noted.  Psychiatric: He has a normal mood and affect.    ED Course  Procedures (including critical care time)     MDM  Patient with nausea, vomiting and diarrhea. Given IVF and antiemetic. No vomiting or diarrhea since arrival in the ER. Pt stable in ED with no significant deterioration in condition.The patient appears reasonably screened and/or stabilized for discharge and I doubt any other medical condition or other [facility] requiring further screening, evaluation, or treatment in the ED at this time prior to discharge.  MDM Reviewed: nursing note and vitals          Nicoletta Dress. Colon Branch, MD 04/12/12 [phone]

## 2012-04-12 NOTE — ED Notes (Signed)
Patient states he ate some food that someone had brought over.  Patient states he went to bed at 10pm and broke out into a sweat and had an episode of diarrhea and vomited x 2.

## 2012-04-12 NOTE — ED Notes (Signed)
Discharge instructions given and reviewed with patient.  Prescription given for Zofran; effects and use explained.  Patient verbalized understanding to take medication as needed for nausea.  Patient ambulatory; discharged home in good condition.

## 2012-04-30 ENCOUNTER — Telehealth: Payer: Self-pay | Admitting: Family Medicine

## 2012-04-30 NOTE — Telephone Encounter (Signed)
Patient requesting copy of med list to be mailed to him for upcoming appt with dentist.

## 2012-05-08 ENCOUNTER — Encounter: Payer: Self-pay | Admitting: *Deleted

## 2012-05-15 ENCOUNTER — Encounter: Payer: Self-pay | Admitting: Family Medicine

## 2012-05-17 ENCOUNTER — Encounter: Payer: Self-pay | Admitting: Family Medicine

## 2012-05-17 ENCOUNTER — Ambulatory Visit (INDEPENDENT_AMBULATORY_CARE_PROVIDER_SITE_OTHER): Payer: Medicare Other | Admitting: Family Medicine

## 2012-05-17 VITALS — BP 128/76 | HR 72 | Resp 18 | Ht 74.0 in | Wt 177.0 lb

## 2012-05-17 DIAGNOSIS — F411 Generalized anxiety disorder: Secondary | ICD-10-CM

## 2012-05-17 DIAGNOSIS — F419 Anxiety disorder, unspecified: Secondary | ICD-10-CM

## 2012-05-17 DIAGNOSIS — M1990 Unspecified osteoarthritis, unspecified site: Secondary | ICD-10-CM

## 2012-05-17 DIAGNOSIS — M129 Arthropathy, unspecified: Secondary | ICD-10-CM

## 2012-05-17 MED ORDER — MELOXICAM 7.5 MG PO TABS: 7.5000 mg | ORAL_TABLET | Freq: Every day | ORAL | Status: DC

## 2012-05-17 MED ORDER — ALPRAZOLAM 1 MG PO TABS: 1.0000 mg | ORAL_TABLET | Freq: Two times a day (BID) | ORAL | Status: DC | PRN

## 2012-05-17 MED ORDER — TRAMADOL HCL 50 MG PO TABS: 50.0000 mg | ORAL_TABLET | Freq: Three times a day (TID) | ORAL | Status: DC | PRN

## 2012-05-17 NOTE — Patient Instructions (Signed)
Continue current medications Cleared to drive from my aspect Keep previous f/u appointment

## 2012-05-17 NOTE — Assessment & Plan Note (Signed)
His arthritis previous no limitation to his driving. Forms completed

## 2012-05-17 NOTE — Progress Notes (Signed)
  Subjective:    Patient ID: Jacob Reed, male    DOB: [birth year], 47 y.o.   MRN: [id number]  HPI  Patient here for [facility] exam. He has had to fill out this form before he's not quite sure why he has been told that this is due to DSS and his ongoing case for custody of his child and allegations or this is due to his anxiety. He has no difficulties operating a vehicle. He takes his Xanax at bedtime and does not use during the day or when operating vehicle. He rarely uses the tramadol for his arthritis  Review of Systems - per above   GEN- denies fatigue, fever, weight loss,weakness, recent illness MSK- denies joint pain, muscle aches, injury Neuro- denies headache, dizziness, syncope, seizure activity      Objective:   Physical Exam  GEN- NAD, alert and oriented x3 Neck- Supple, good ROM MSK- Back Spine NT, neg SLR, Knees Good ROM, UE normal ROM Neuro- CNII-XII in tact, no focal deficits, strength equal bilat, sensation in tact Psych- normal affect and mood  Pulses- Radial 2+        Assessment & Plan:

## 2012-05-17 NOTE — Assessment & Plan Note (Signed)
Anxiety well controlled at this point, no limitation to driving No history of substance abuse, I am aware of

## 2012-07-25 ENCOUNTER — Encounter: Payer: Self-pay | Admitting: Family Medicine

## 2012-07-25 ENCOUNTER — Ambulatory Visit (INDEPENDENT_AMBULATORY_CARE_PROVIDER_SITE_OTHER): Payer: Medicare Other | Admitting: Family Medicine

## 2012-07-25 VITALS — BP 100/70 | HR 78 | Temp 97.8°F | Resp 18 | Ht 70.0 in | Wt 175.0 lb

## 2012-07-25 DIAGNOSIS — F411 Generalized anxiety disorder: Secondary | ICD-10-CM

## 2012-07-25 DIAGNOSIS — M129 Arthropathy, unspecified: Secondary | ICD-10-CM

## 2012-07-25 DIAGNOSIS — K0889 Other specified disorders of teeth and supporting structures: Secondary | ICD-10-CM

## 2012-07-25 DIAGNOSIS — M1990 Unspecified osteoarthritis, unspecified site: Secondary | ICD-10-CM

## 2012-07-25 DIAGNOSIS — F419 Anxiety disorder, unspecified: Secondary | ICD-10-CM

## 2012-07-25 DIAGNOSIS — K089 Disorder of teeth and supporting structures, unspecified: Secondary | ICD-10-CM

## 2012-07-25 MED ORDER — PENICILLIN V POTASSIUM 500 MG PO TABS: 500.0000 mg | ORAL_TABLET | Freq: Three times a day (TID) | ORAL | Status: DC

## 2012-07-25 NOTE — Patient Instructions (Addendum)
Start antibiotics Continue current medications Ultram there for arthritis Schedule appt to have mole removed  F/U 4 months for regular appt

## 2012-07-26 ENCOUNTER — Ambulatory Visit: Payer: Medicare Other | Admitting: Family Medicine

## 2012-07-26 DIAGNOSIS — K0889 Other specified disorders of teeth and supporting structures: Secondary | ICD-10-CM | POA: Insufficient documentation

## 2012-07-26 NOTE — Assessment & Plan Note (Signed)
Very poor dentition with rotting teeth. He has found a dentist that will pull the remainder of his teeth Will start penicillin today

## 2012-07-26 NOTE — Assessment & Plan Note (Signed)
Controlled, advised ultram at pharmacy use as needed

## 2012-07-26 NOTE — Progress Notes (Signed)
  Subjective:    Patient ID: Jacob Reed, male    DOB: [birth year], 47 y.o.   MRN: [id number]  HPI Pt here to f/u chronic medical problems. Has tooth pain for past month, unable to get in with dentist, most of his teeth have broken off. No fever Anxiety- his court case was dismissed states he did not know he was facing jail time for the accusations brought upon him. Taking xanax as prescribed OA- uses rubbing alcohol has needed, no pain currently   Review of Systems    GEN- denies fatigue, fever, weight loss,weakness, recent illness HEENT- denies eye drainage, change in vision, nasal discharge, CVS- denies chest pain, palpitations RESP- denies SOB, cough, wheeze ABD- denies N/V, change in stools, abd pain GU- denies dysuria, hematuria, dribbling, incontinence MSK- denies joint pain, muscle aches, injury Neuro- denies headache, dizziness, syncope, seizure activity      Objective:   Physical Exam GEN- NAD, alert and oriented x3 HEENT- PERRL, EOMI, non injected sclera, pink conjunctiva, MMM, oropharynx clear, very poor dentition, broken teeth at roots, cavities, TTP along upper gumline, mild swelling of gums and erythema, no discrete abscess Neck- Supple, no LAD CVS- RRR, no murmur RESP-CTAB EXT- No edema Pulses- Radial 2+ Psych- normal affect and mood        Assessment & Plan:

## 2012-07-26 NOTE — Assessment & Plan Note (Signed)
Well controlled, no change to meds 

## 2012-08-21 ENCOUNTER — Telehealth: Payer: Self-pay | Admitting: Family Medicine

## 2012-08-21 MED ORDER — ALPRAZOLAM 1 MG PO TABS: 1.0000 mg | ORAL_TABLET | Freq: Two times a day (BID) | ORAL | Status: DC | PRN

## 2012-08-21 NOTE — Telephone Encounter (Signed)
Okay to refill? 

## 2012-08-21 NOTE — Telephone Encounter (Signed)
Med phoned in °

## 2012-08-21 NOTE — Telephone Encounter (Signed)
Ok to refill 

## 2012-08-21 NOTE — Telephone Encounter (Signed)
Xanax 1 mg tab 1 BID prn sleep #40 last rf 07/25/12

## 2012-09-04 ENCOUNTER — Ambulatory Visit: Payer: Medicare Other | Admitting: Urology

## 2012-09-26 ENCOUNTER — Encounter (HOSPITAL_COMMUNITY): Payer: Self-pay | Admitting: Pharmacy Technician

## 2012-09-26 NOTE — Consult Note (Signed)
NAME:  Jacob, Reed NO.:  MEDICAL RECORD NO.:  [id number]  LOCATION:                                 FACILITY:  PHYSICIAN:  Jacob Reed, M.D. DATE OF BIRTH:  Jun 28, 1965  DATE OF CONSULTATION:  09/25/2012 DATE OF DISCHARGE:                                CONSULTATION   DIAGNOSIS:  Right inguinal hernia.  HISTORY OF PRESENT MEDICAL ILLNESS:  The patient states that he has noted a bulge in Jacob right inguinal area for at least 11 years, this is increased in size.  This is incarcerated and I explained to him the importance of having this repaired.  We discussed complications of surgery including bleeding, infection, and recurrence.  Informed consent was obtained.  This patient is somewhat mentally challenged; however, he does Jacob own.  He is married and he manages Jacob own affairs and we discussed this as well with Jacob Reed.  PAST SURGICAL HISTORY:  Included vasectomy in 1998.  ALLERGIES:  The patient has some intolerance to codeine.  SOCIAL HISTORY:  He does not smoke, tobacco, but he dips snuff.  He does not drink alcohol.  MEDICATIONS:  He takes Xanax for Jacob anxiety.  PHYSICAL EXAMINATION:  VITAL SIGNS:  He is 6 feet and 2 inches, weighs 173 pounds, temperature is 98.6, pulse is 80, respirations 14, blood pressure 102/64. HEENT:  Head is normocephalic.  Jacob extraocular movements are intact. Jacob pupils are round and reactive to light and accommodation.  Nose, oral mucosa are moist.  The patient has poor dentition and as obviously the signs of someone who dips snuff. NECK:  No bruits are appreciated.  He has no jugular vein distention, thyromegaly, tracheal deviation, or any adenopathy appreciated in Jacob neck. CHEST:  Clear, both anterior and posterior auscultation. HEART:  Regular rhythm. ABDOMEN:  Completely soft.  He has right inguinal hernia, which is moderate size, which is reducible. RECTAL:  Deferred. GENITALIA:  Within normal  limits. EXTREMITIES:  Within normal limits.  REVIEW OF SYSTEMS:  No history of migraines or seizures.  He has history of being mentally deficient.  No lateralizing neurological findings.  He has a history of anxiety.  ENDOCRINE:  No history of diabetes, thyroid disease or adrenal problems.  CARDIOPULMONARY SYSTEM:  Grossly within normal limits.  MUSCULOSKELETAL SYSTEM:  Within normal limits.  GI SYSTEM:  No history of hepatitis, constipation, diarrhea, bright red rectal bleeding or black tarry stools.  No history of inflammatory bowel disease or irritable bowel syndrome.  No history of unexplained weight loss.  The patient has never had a colonoscopy.  GU SYSTEM:  No history of dysuria, kidney stones or frequency.  REVIEW OF HISTORY AND PHYSICAL:  Therefore, Jacob Reed is a 47 year old white male who has a right inguinal hernia, that has been present for quite some time.  We will plan to take care of this on elective basis via the outpatient department.  This was explained to him and Jacob Reed, and he was also told to go to the emergency room should there be any symptoms of incarceration.     Jacob Reed, M.D.  WB/MEDQ  D:  09/25/2012  T:  09/26/2012  Job:  [id number]

## 2012-09-27 ENCOUNTER — Encounter (HOSPITAL_COMMUNITY)
Admission: RE | Admit: 2012-09-27 | Discharge: 2012-09-27 | Disposition: A | Discharge status: Home / Self Care | Payer: Medicare Other | Source: Ambulatory Visit | Attending: General Surgery | Admitting: General Surgery

## 2012-09-27 ENCOUNTER — Encounter (HOSPITAL_COMMUNITY): Payer: Self-pay

## 2012-09-27 DIAGNOSIS — Z01812 Encounter for preprocedural laboratory examination: Secondary | ICD-10-CM | POA: Insufficient documentation

## 2012-09-27 HISTORY — DX: Dental caries, unspecified: K02.9

## 2012-09-27 LAB — CBC WITH DIFFERENTIAL/PLATELET
Basophils Relative: 0 % (ref 0–1)
Eosinophils Absolute: 0.1 10*3/uL (ref 0.0–0.7)
Eosinophils Relative: 1 % (ref 0–5)
Hemoglobin: 13.9 g/dL (ref 13.0–17.0)
Lymphs Abs: 1.7 10*3/uL (ref 0.7–4.0)
MCH: 30.1 pg (ref 26.0–34.0)
MCHC: 34.2 g/dL (ref 30.0–36.0)
MCV: 88.1 fL (ref 78.0–100.0)
Monocytes Relative: 7 % (ref 3–12)
Neutrophils Relative %: 72 % (ref 43–77)
RBC: 4.62 MIL/uL (ref 4.22–5.81)

## 2012-09-27 LAB — BASIC METABOLIC PANEL
BUN: 14 mg/dL (ref 6–23)
Calcium: 9.4 mg/dL (ref 8.4–10.5)
Creatinine, Ser: 1.17 mg/dL (ref 0.50–1.35)
GFR calc Af Amer: 84 mL/min — ABNORMAL LOW (ref >90)
GFR calc non Af Amer: 72 mL/min — ABNORMAL LOW (ref >90)
Glucose, Bld: 103 mg/dL — ABNORMAL HIGH (ref 70–99)

## 2012-09-27 NOTE — Patient Instructions (Addendum)
Your procedure is scheduled on: 10/01/2012  Report to [facility] at  6:15   AM.  Call this number if you have problems the morning of surgery: [phone]   Remember:   Do not drink or eat food:After Midnight.  :  Take these medicines the morning of surgery with A SIP OF WATER: Xanax   Do not wear jewelry, make-up or nail polish.  Do not wear lotions, powders, or perfumes. You may wear deodorant.  Do not shave 48 hours prior to surgery. Men may shave face and neck.  Do not bring valuables to the hospital.  Contacts, dentures or bridgework may not be worn into surgery.  Leave suitcase in the car. After surgery it may be brought to your room.  For patients admitted to the hospital, checkout time is 11:00 AM the day of discharge.   Patients discharged the day of surgery will not be allowed to drive home.    Special Instructions: Shower using CHG 2 nights before surgery and the night before surgery.  If you shower the day of surgery use CHG.  Use special wash - you have one bottle of CHG for all showers.  You should use approximately 1/3 of the bottle for each shower.   Please read over the following fact sheets that you were given: Pain Booklet, MRSA Information, Surgical Site Infection Prevention and Care and Recovery After Surgery  Hernia, Surgical Repair Care After Refer to this sheet in the next few weeks. These discharge instructions provide you with general information on caring for yourself after you leave the hospital. Your caregiver may also give you specific instructions. Your treatment has been planned according to the most current medical practices available, but unavoidable complications sometimes occur. If you have any problems or questions after discharge, please call your caregiver. HOME CARE INSTRUCTIONS   It is normal to be sore for a couple weeks after surgery. See your caregiver if this seems to be getting worse rather than better.  Put ice on the operative site.  Put  ice in a plastic bag.  Place a towel between your skin and the bag.  Leave the ice on for 15-20 minutes at a time, 3-4 times a day for the first 2 days.  Change bandages (dressings) as directed.  Keep the wound dry and clean. The wound may be washed gently with soap and water. Gently blot or dab the wound dry. Do not take baths, use swimming pools, or use hot tubs for 10 days, or as directed by your caregiver.  Only take over-the-counter or prescription medicines for pain, discomfort, or fever as directed by your caregiver.  Continue your normal diet as directed.  Do not drive until your caregiver says it is okay.  Do not lift anything more than 10 pounds or play contact sports for 3 weeks, or as directed.  Make an appointment to see your caregiver for stitches (sutures) or staple removal when instructed. SEEK MEDICAL CARE IF:   You have increased bleeding coming from the wounds.  You have blood in your stool.  You see redness, swelling, or have increasing pain in the wounds.  You have fluid (pus) coming from the wound.  You have an oral temperature above 102 F (38.9 C).  You notice a bad smell coming from the wound or dressing.  You develop lightheadedness or feel faint. SEEK IMMEDIATE MEDICAL CARE IF:   You develop a rash.  You have difficulty breathing.  You develop any reaction  or side effects to medicines given. MAKE SURE YOU:   Understand these instructions.  Will watch your condition.  Will get help right away if you are not doing well or get worse. Document Released: 07/16/2004 Document Revised: 03/21/2011 Document Reviewed: 11/26/2008 [facility] Patient Information 2014 [place], [place]. PATIENT INSTRUCTIONS POST-ANESTHESIA  IMMEDIATELY FOLLOWING SURGERY:  Do not drive or operate machinery for the first twenty four hours after surgery.  Do not make any important decisions for twenty four hours after surgery or while taking narcotic pain medications or  sedatives.  If you develop intractable nausea and vomiting or a severe headache please notify your doctor immediately.  FOLLOW-UP:  Please make an appointment with your surgeon as instructed. You do not need to follow up with anesthesia unless specifically instructed to do so.  WOUND CARE INSTRUCTIONS (if applicable):  Keep a dry clean dressing on the anesthesia/puncture wound site if there is drainage.  Once the wound has quit draining you may leave it open to air.  Generally you should leave the bandage intact for twenty four hours unless there is drainage.  If the epidural site drains for more than 36-48 hours please call the anesthesia department.  QUESTIONS?:  Please feel free to call your physician or the hospital operator if you have any questions, and they will be happy to assist you.

## 2012-10-01 ENCOUNTER — Encounter (HOSPITAL_COMMUNITY)
Admission: RE | Disposition: A | Discharge status: Home / Self Care | Payer: Self-pay | Source: Ambulatory Visit | Attending: General Surgery

## 2012-10-01 ENCOUNTER — Ambulatory Visit (HOSPITAL_COMMUNITY): Payer: PRIVATE HEALTH INSURANCE | Admitting: Anesthesiology

## 2012-10-01 ENCOUNTER — Encounter (HOSPITAL_COMMUNITY): Payer: Self-pay | Admitting: Anesthesiology

## 2012-10-01 ENCOUNTER — Encounter (HOSPITAL_COMMUNITY): Payer: Self-pay | Admitting: *Deleted

## 2012-10-01 ENCOUNTER — Ambulatory Visit (HOSPITAL_COMMUNITY)
Admission: RE | Admit: 2012-10-01 | Discharge: 2012-10-02 | Disposition: A | Discharge status: Home / Self Care | Payer: PRIVATE HEALTH INSURANCE | Source: Ambulatory Visit | Attending: General Surgery | Admitting: General Surgery

## 2012-10-01 DIAGNOSIS — F411 Generalized anxiety disorder: Secondary | ICD-10-CM | POA: Insufficient documentation

## 2012-10-01 DIAGNOSIS — F79 Unspecified intellectual disabilities: Secondary | ICD-10-CM | POA: Insufficient documentation

## 2012-10-01 DIAGNOSIS — Z9852 Vasectomy status: Secondary | ICD-10-CM | POA: Insufficient documentation

## 2012-10-01 DIAGNOSIS — Z79899 Other long term (current) drug therapy: Secondary | ICD-10-CM | POA: Insufficient documentation

## 2012-10-01 DIAGNOSIS — F17200 Nicotine dependence, unspecified, uncomplicated: Secondary | ICD-10-CM | POA: Insufficient documentation

## 2012-10-01 DIAGNOSIS — K4090 Unilateral inguinal hernia, without obstruction or gangrene, not specified as recurrent: Secondary | ICD-10-CM | POA: Insufficient documentation

## 2012-10-01 HISTORY — PX: INGUINAL HERNIA REPAIR: SHX194

## 2012-10-01 SURGERY — REPAIR, HERNIA, INGUINAL, ADULT
Anesthesia: General | Site: Groin | Laterality: Right | Wound class: Clean

## 2012-10-01 MED ORDER — ONDANSETRON HCL 4 MG/2ML IJ SOLN
4.0000 mg | Freq: Four times a day (QID) | INTRAMUSCULAR | Status: DC | PRN
  Administered 2012-10-01: 4 mg via INTRAVENOUS
  Filled 2012-10-01: qty 2

## 2012-10-01 MED ORDER — PROPOFOL 10 MG/ML IV EMUL
INTRAVENOUS | Status: AC
  Filled 2012-10-01: qty 20

## 2012-10-01 MED ORDER — STERILE WATER FOR IRRIGATION IR SOLN
Status: DC | PRN
  Administered 2012-10-01: 2000 mL

## 2012-10-01 MED ORDER — ONDANSETRON HCL 4 MG/2ML IJ SOLN: 4.0000 mg | Freq: Once | INTRAMUSCULAR | Status: DC | PRN

## 2012-10-01 MED ORDER — ROCURONIUM BROMIDE 50 MG/5ML IV SOLN
INTRAVENOUS | Status: AC
  Filled 2012-10-01: qty 1

## 2012-10-01 MED ORDER — ALPRAZOLAM 1 MG PO TABS: 1.0000 mg | ORAL_TABLET | Freq: Two times a day (BID) | ORAL | Status: DC | PRN

## 2012-10-01 MED ORDER — FENTANYL CITRATE 0.05 MG/ML IJ SOLN: 25.0000 ug | INTRAMUSCULAR | Status: DC | PRN

## 2012-10-01 MED ORDER — SUCCINYLCHOLINE CHLORIDE 20 MG/ML IJ SOLN
INTRAMUSCULAR | Status: DC | PRN
  Administered 2012-10-01: 150 mg via INTRAVENOUS

## 2012-10-01 MED ORDER — FENTANYL CITRATE 0.05 MG/ML IJ SOLN
INTRAMUSCULAR | Status: AC
  Filled 2012-10-01: qty 5

## 2012-10-01 MED ORDER — GLYCOPYRROLATE 0.2 MG/ML IJ SOLN
0.2000 mg | Freq: Once | INTRAMUSCULAR | Status: AC
  Administered 2012-10-01: 0.2 mg via INTRAVENOUS

## 2012-10-01 MED ORDER — FENTANYL CITRATE 0.05 MG/ML IJ SOLN
INTRAMUSCULAR | Status: DC | PRN
  Administered 2012-10-01 (×3): 50 ug via INTRAVENOUS
  Administered 2012-10-01: 100 ug via INTRAVENOUS

## 2012-10-01 MED ORDER — NEOSTIGMINE METHYLSULFATE 1 MG/ML IJ SOLN
INTRAMUSCULAR | Status: DC | PRN
  Administered 2012-10-01 (×3): 1 mg via INTRAVENOUS

## 2012-10-01 MED ORDER — MIDAZOLAM HCL 2 MG/2ML IJ SOLN: 1.0000 mg | INTRAMUSCULAR | Status: DC | PRN

## 2012-10-01 MED ORDER — DOCUSATE SODIUM 100 MG PO CAPS
100.0000 mg | ORAL_CAPSULE | Freq: Every day | ORAL | Status: DC
  Administered 2012-10-01 – 2012-10-02 (×2): 100 mg via ORAL
  Filled 2012-10-01 (×2): qty 1

## 2012-10-01 MED ORDER — MIDAZOLAM HCL 5 MG/5ML IJ SOLN
INTRAMUSCULAR | Status: DC | PRN
  Administered 2012-10-01: 2 mg via INTRAVENOUS

## 2012-10-01 MED ORDER — GLYCOPYRROLATE 0.2 MG/ML IJ SOLN
INTRAMUSCULAR | Status: AC
  Filled 2012-10-01: qty 1

## 2012-10-01 MED ORDER — ONDANSETRON HCL 4 MG/2ML IJ SOLN
INTRAMUSCULAR | Status: AC
  Filled 2012-10-01: qty 2

## 2012-10-01 MED ORDER — BUPIVACAINE HCL (PF) 0.5 % IJ SOLN
INTRAMUSCULAR | Status: AC
  Filled 2012-10-01: qty 30

## 2012-10-01 MED ORDER — GLYCOPYRROLATE 0.2 MG/ML IJ SOLN
INTRAMUSCULAR | Status: AC
  Filled 2012-10-01: qty 2

## 2012-10-01 MED ORDER — MIDAZOLAM HCL 2 MG/2ML IJ SOLN
INTRAMUSCULAR | Status: AC
  Filled 2012-10-01: qty 2

## 2012-10-01 MED ORDER — SUCCINYLCHOLINE CHLORIDE 20 MG/ML IJ SOLN
INTRAMUSCULAR | Status: AC
  Filled 2012-10-01: qty 1

## 2012-10-01 MED ORDER — MIDAZOLAM HCL 2 MG/2ML IJ SOLN
1.0000 mg | INTRAMUSCULAR | Status: DC | PRN
  Administered 2012-10-01: 2 mg via INTRAVENOUS

## 2012-10-01 MED ORDER — LACTATED RINGERS IV SOLN: INTRAVENOUS | Status: DC

## 2012-10-01 MED ORDER — ONDANSETRON HCL 4 MG/2ML IJ SOLN
4.0000 mg | Freq: Once | INTRAMUSCULAR | Status: AC
  Administered 2012-10-01: 4 mg via INTRAVENOUS

## 2012-10-01 MED ORDER — ROCURONIUM BROMIDE 100 MG/10ML IV SOLN
INTRAVENOUS | Status: DC | PRN
  Administered 2012-10-01: 25 mg via INTRAVENOUS

## 2012-10-01 MED ORDER — HYDROMORPHONE HCL PF 1 MG/ML IJ SOLN
0.5000 mg | INTRAMUSCULAR | Status: DC | PRN
  Administered 2012-10-01 (×2): 0.5 mg via INTRAVENOUS
  Filled 2012-10-01 (×2): qty 1

## 2012-10-01 MED ORDER — PROPOFOL 10 MG/ML IV BOLUS
INTRAVENOUS | Status: DC | PRN
  Administered 2012-10-01: 150 mg via INTRAVENOUS

## 2012-10-01 MED ORDER — LIDOCAINE HCL 1 % IJ SOLN
INTRAMUSCULAR | Status: DC | PRN
  Administered 2012-10-01: 50 mg via INTRADERMAL

## 2012-10-01 MED ORDER — GLYCOPYRROLATE 0.2 MG/ML IJ SOLN
INTRAMUSCULAR | Status: DC | PRN
  Administered 2012-10-01: 0.4 mg via INTRAVENOUS

## 2012-10-01 MED ORDER — CEFAZOLIN SODIUM 1-5 GM-% IV SOLN
INTRAVENOUS | Status: AC
  Filled 2012-10-01: qty 100

## 2012-10-01 MED ORDER — CEFAZOLIN SODIUM-DEXTROSE 2-3 GM-% IV SOLR
2.0000 g | Freq: Once | INTRAVENOUS | Status: AC
  Administered 2012-10-01: 2 g via INTRAVENOUS

## 2012-10-01 MED ORDER — GLYCOPYRROLATE 0.2 MG/ML IJ SOLN: 0.1000 mg | Freq: Once | INTRAMUSCULAR | Status: DC

## 2012-10-01 MED ORDER — LACTATED RINGERS IV SOLN
INTRAVENOUS | Status: DC
  Administered 2012-10-01: 07:00:00 via INTRAVENOUS

## 2012-10-01 MED ORDER — ONDANSETRON HCL 4 MG PO TABS: 4.0000 mg | ORAL_TABLET | Freq: Four times a day (QID) | ORAL | Status: DC | PRN

## 2012-10-01 MED ORDER — BUPIVACAINE HCL (PF) 0.5 % IJ SOLN
INTRAMUSCULAR | Status: DC | PRN
  Administered 2012-10-01: 17 mL

## 2012-10-01 MED ORDER — SODIUM CHLORIDE 0.9 % IR SOLN
Status: DC | PRN
  Administered 2012-10-01: 1000 mL

## 2012-10-01 MED ORDER — POTASSIUM CHLORIDE IN NACL 20-0.9 MEQ/L-% IV SOLN
INTRAVENOUS | Status: DC
  Administered 2012-10-01: 12:00:00 via INTRAVENOUS

## 2012-10-01 SURGICAL SUPPLY — 45 items
ATTRACTOMAT 16X20 MAGNETIC DRP (DRAPES) ×2 IMPLANT
BAG HAMPER (MISCELLANEOUS) ×2 IMPLANT
CLOTH BEACON ORANGE TIMEOUT ST (SAFETY) ×2 IMPLANT
COVER LIGHT HANDLE STERIS (MISCELLANEOUS) ×4 IMPLANT
DECANTER SPIKE VIAL GLASS SM (MISCELLANEOUS) ×2 IMPLANT
DRAIN PENROSE 12X.25 LTX STRL (MISCELLANEOUS) ×2 IMPLANT
DRSG MEPILEX BORDER 4X8 (GAUZE/BANDAGES/DRESSINGS) IMPLANT
DRSG TEGADERM 4X4.75 (GAUZE/BANDAGES/DRESSINGS) ×1 IMPLANT
ELECT REM PT RETURN 9FT ADLT (ELECTROSURGICAL) ×2
ELECTRODE REM PT RTRN 9FT ADLT (ELECTROSURGICAL) ×1 IMPLANT
FORMALIN 10 PREFIL 120ML (MISCELLANEOUS) ×2 IMPLANT
GLOVE BIOGEL PI IND STRL 7.0 (GLOVE) IMPLANT
GLOVE BIOGEL PI IND STRL 7.5 (GLOVE) IMPLANT
GLOVE BIOGEL PI INDICATOR 7.0 (GLOVE) ×2
GLOVE BIOGEL PI INDICATOR 7.5 (GLOVE) ×1
GLOVE ECLIPSE 6.5 STRL STRAW (GLOVE) ×1 IMPLANT
GLOVE SKINSENSE NS SZ7.0 (GLOVE) ×2
GLOVE SKINSENSE STRL SZ7.0 (GLOVE) ×1 IMPLANT
GLOVE SS BIOGEL STRL SZ 6.5 (GLOVE) IMPLANT
GLOVE SUPERSENSE BIOGEL SZ 6.5 (GLOVE) ×1
GOWN STRL REIN XL XLG (GOWN DISPOSABLE) ×6 IMPLANT
INST SET MINOR GENERAL (KITS) ×2 IMPLANT
KIT ROOM TURNOVER APOR (KITS) ×2 IMPLANT
MANIFOLD NEPTUNE II (INSTRUMENTS) ×2 IMPLANT
NS IRRIG 1000ML POUR BTL (IV SOLUTION) ×2 IMPLANT
PACK MINOR (CUSTOM PROCEDURE TRAY) ×2 IMPLANT
PAD ARMBOARD 7.5X6 YLW CONV (MISCELLANEOUS) ×2 IMPLANT
SET BASIN LINEN APH (SET/KITS/TRAYS/PACK) ×2 IMPLANT
SOL PREP PROV IODINE SCRUB 4OZ (MISCELLANEOUS) ×2 IMPLANT
SPONGE GAUZE 4X4 12PLY (GAUZE/BANDAGES/DRESSINGS) ×2 IMPLANT
SPONGE INTESTINAL PEANUT (DISPOSABLE) ×2 IMPLANT
SPONGE LAP 18X18 X RAY DECT (DISPOSABLE) ×2 IMPLANT
STAPLER VISISTAT 35W (STAPLE) ×2 IMPLANT
SUT NOVA NAB GS-22 2 2-0 T-19 (SUTURE) IMPLANT
SUT NUROLON NAB CT 2 2-0 18IN (SUTURE) ×2 IMPLANT
SUT PROLENE 0 CT 1 CR/8 (SUTURE) IMPLANT
SUT SILK 2 0 (SUTURE) ×2
SUT SILK 2-0 18XBRD TIE 12 (SUTURE) ×1 IMPLANT
SUT VIC AB 3-0 SH 27 (SUTURE) ×2
SUT VIC AB 3-0 SH 27X BRD (SUTURE) ×1 IMPLANT
SUT VICRYL AB 3 0 TIES (SUTURE) ×2 IMPLANT
SYR BULB IRRIGATION 50ML (SYRINGE) ×2 IMPLANT
SYR CONTROL 10ML LL (SYRINGE) ×2 IMPLANT
TRAY FOLEY CATH 16FR SILVER (SET/KITS/TRAYS/PACK) ×2 IMPLANT
WATER STERILE IRR 1000ML POUR (IV SOLUTION) ×4 IMPLANT

## 2012-10-01 NOTE — Anesthesia Preprocedure Evaluation (Signed)
Anesthesia Evaluation  Patient identified by MRN, date of birth, ID band Patient awake  General Assessment Comment:resonds slowly to questions.   Reviewed: Allergy & Precautions, H&P , NPO status , Patient's Chart, lab work & pertinent test results  History of Anesthesia Complications Negative for: history of anesthetic complications  Airway Mallampati: III TM Distance: >3 FB Neck ROM: Full    Dental  (+) Poor Dentition, Missing and Dental Advisory Given   Pulmonary neg pulmonary ROS,  breath sounds clear to auscultation        Cardiovascular negative cardio ROS  Rhythm:Regular Rate:Normal     Neuro/Psych PSYCHIATRIC DISORDERS Anxiety    GI/Hepatic negative GI ROS,   Endo/Other    Renal/GU      Musculoskeletal   Abdominal   Peds  Hematology   Anesthesia Other Findings   Reproductive/Obstetrics                           Anesthesia Physical Anesthesia Plan  ASA: II  Anesthesia Plan: General   Post-op Pain Management:    Induction: Intravenous  Airway Management Planned: LMA  Additional Equipment:   Intra-op Plan:   Post-operative Plan: Extubation in OR  Informed Consent: I have reviewed the patients History and Physical, chart, labs and discussed the procedure including the risks, benefits and alternatives for the proposed anesthesia with the patient or authorized representative who has indicated his/her understanding and acceptance.     Plan Discussed with:   Anesthesia Plan Comments:         Anesthesia Quick Evaluation

## 2012-10-01 NOTE — Progress Notes (Signed)
47 yr. Old W. Male for elective repair of RIH.  Procedure and risks explained to family and informed consent obtained. Labs reviewed and surgical site marked.  No clinical change since H&P, dict. # [id number].  Filed Vitals:   10/01/12 0656  Pulse: 67  Temp: 98 F (36.7 C)  Resp: 18  BP 130/89  O2 sat 100%.

## 2012-10-01 NOTE — Anesthesia Postprocedure Evaluation (Signed)
  Anesthesia Post-op Note  Patient: Jacob Reed  Procedure(s) Performed: Procedure(s): HERNIA REPAIR INGUINAL ADULT (Right)  Patient Location: PACU  Anesthesia Type:General  Level of Consciousness: awake and patient cooperative  Airway and Oxygen Therapy: Patient Spontanous Breathing  Post-op Pain: 3 /10, mild  Post-op Assessment: Post-op Vital signs reviewed, Patient's Cardiovascular Status Stable, Respiratory Function Stable, Patent Airway, No signs of Nausea or vomiting and Pain level controlled  Post-op Vital Signs: Reviewed and stable  Complications: No apparent anesthesia complications

## 2012-10-01 NOTE — Progress Notes (Signed)
Post OP Check  Awake and alert.   Filed Vitals:   10/01/12 1701  BP: 126/75  Pulse: 86  Temp: 98.8 F (37.1 C)  Resp: 20   Wound clean and dry. Voiding without foley.  Not ambulating as yet. Will discharge in AM.  Pain controlled on present regimen.

## 2012-10-01 NOTE — Anesthesia Procedure Notes (Signed)
Procedure Name: Intubation Date/Time: 10/01/2012 7:45 AM Performed by: Despina Hidden Pre-anesthesia Checklist: Emergency Drugs available, Suction available, Patient being monitored and Patient identified Patient Re-evaluated:Patient Re-evaluated prior to inductionOxygen Delivery Method: Circle system utilized Preoxygenation: Pre-oxygenation with 100% oxygen Intubation Type: IV induction, Rapid sequence and Cricoid Pressure applied Ventilation: Mask ventilation without difficulty Laryngoscope Size: Mac and 3 Grade View: Grade I Tube type: Oral Tube size: 8.0 mm Number of attempts: 1 Airway Equipment and Method: Stylet Placement Confirmation: ETT inserted through vocal cords under direct vision,  positive ETCO2 and breath sounds checked- equal and bilateral Secured at: 22 cm Tube secured with: Tape Dental Injury: Teeth and Oropharynx as per pre-operative assessment

## 2012-10-01 NOTE — Brief Op Note (Signed)
10/01/2012  9:05 AM  PATIENT:  Huel Cote  47 y.o. male  PRE-OPERATIVE DIAGNOSIS:  right inguinal hernia  POST-OPERATIVE DIAGNOSIS:  right inguinal hernia  PROCEDURE:  Procedure(s): HERNIA REPAIR INGUINAL ADULT (Right)  SURGEON:  Surgeon(s) and Role:    * Marlane Hatcher, MD - Primary  PHYSICIAN ASSISTANT:   ASSISTANTS: none   ANESTHESIA:   general  EBL:  Total I/O In: 500 [I.V.:500] Out: -   BLOOD ADMINISTERED:none  DRAINS: none   LOCAL MEDICATIONS USED:  MARCAINE   0.5% ~ 15 cc.  SPECIMEN:  Source of Specimen:  Right inguinal hernia sac.  DISPOSITION OF SPECIMEN:  PATHOLOGY  COUNTS:  YES  TOURNIQUET:  * No tourniquets in log *  DICTATION: .Other Dictation: Dictation Number OR dict.# [id number].  PLAN OF CARE: Admit for overnight observation  PATIENT DISPOSITION:  PACU - hemodynamically stable.   Delay start of Pharmacological VTE agent (>24hrs) due to surgical blood loss or risk of bleeding: not applicable

## 2012-10-01 NOTE — Transfer of Care (Signed)
Immediate Anesthesia Transfer of Care Note  Patient: Jacob Reed  Procedure(s) Performed: Procedure(s): HERNIA REPAIR INGUINAL ADULT (Right)  Patient Location: PACU  Anesthesia Type:General  Level of Consciousness: awake and patient cooperative  Airway & Oxygen Therapy: Patient Spontanous Breathing and Patient connected to face mask oxygen  Post-op Assessment: Report given to PACU RN, Post -op Vital signs reviewed and stable and Patient moving all extremities  Post vital signs: Reviewed and stable  Complications: No apparent anesthesia complications

## 2012-10-02 ENCOUNTER — Encounter (HOSPITAL_COMMUNITY): Payer: Self-pay | Admitting: General Surgery

## 2012-10-02 MED ORDER — DSS 100 MG PO CAPS: 100.0000 mg | ORAL_CAPSULE | Freq: Every day | ORAL | Status: DC

## 2012-10-02 MED ORDER — DOCUSATE SODIUM 100 MG PO CAPS: 100.0000 mg | ORAL_CAPSULE | Freq: Two times a day (BID) | ORAL | Status: DC

## 2012-10-02 MED ORDER — TRAMADOL HCL 50 MG PO TABS: 50.0000 mg | ORAL_TABLET | Freq: Four times a day (QID) | ORAL | Status: DC | PRN

## 2012-10-02 NOTE — Anesthesia Postprocedure Evaluation (Signed)
  Anesthesia Post-op Note  Patient: Jacob Reed  Procedure(s) Performed: Procedure(s): HERNIA REPAIR INGUINAL ADULT (Right)  Patient Location: Nursing Unit  Anesthesia Type:General  Level of Consciousness: awake, alert  and patient cooperative  Airway and Oxygen Therapy: Patient Spontanous Breathing  Post-op Pain: mild  Post-op Assessment: Post-op Vital signs reviewed, Patient's Cardiovascular Status Stable, Respiratory Function Stable, Patent Airway, No signs of Nausea or vomiting, Adequate PO intake and Pain level controlled  Post-op Vital Signs: Reviewed and stable  Complications: No apparent anesthesia complications

## 2012-10-02 NOTE — Progress Notes (Signed)
Post OP Day 1  Filed Vitals:   10/02/12 0517  BP: 99/61  Pulse: 78  Temp: 98.6 F (37 C)  Resp: 18    Wound clean and redressed.  No problems voiding, minimal incisional discomfort.  Ambulating well.  No GI problems, shortness of breath or leg pain.  Discharge and follow up arranged.  Discharge dict done, dict. # [id number].

## 2012-10-02 NOTE — Progress Notes (Signed)
UR Chart Review Completed  

## 2012-10-02 NOTE — Op Note (Signed)
NAME:  WHEELER, INCORVAIA NO.:  [id number]  MEDICAL RECORD NO.:  [id number]  LOCATION:  A324                          FACILITY:  [facility]  PHYSICIAN:  Barbaraann Barthel, M.D. DATE OF BIRTH:  12/06/1965  DATE OF PROCEDURE:  10/01/2012 DATE OF DISCHARGE:                              OPERATIVE REPORT   DIAGNOSIS:  Right inguinal hernia.  PROCEDURE:  Right inguinal hernia repair (modified McVay repair without mesh).  SPECIMEN:  Right inguinal hernia sac.  WOUND CLASSIFICATION:  Clean.  NOTE:  This is a 47 year old white male who presented with a non- incarcerated right inguinal hernia.  We planned to take care of this via the outpatient department.  I discussed complications, not limited to, but including bleeding, infection, and recurrence, and the possibility that prosthesis might be used in the repair.  Informed consent was obtained.  It should be noted that this patient is a little mentally challenged, however, we were able to explain things to him and his son and informed consent was obtained.  GROSS OPERATIVE FINDINGS:  The patient had an indirect hernia and a direct hernia.  No other abnormalities were noted.  It should be stated this patient had previously undergone a vasectomy.  TECHNIQUE:  The patient was placed in the supine position after the adequate administration of spinal anesthesia.  A Foley catheter was aseptically inserted and he was prepped with Betadine solution and draped in usual manner.  An incision was carried out between the anterior-superior iliac spine and the pubic tubercle through the skin, subcutaneous tissue, through Scarpa layer down to the external oblique, which was opened through the external ring.  The cord structures were then dissected free from a rather large indirect hernia sac.  This was doubly ligated under direct vision and the redundant portion of which was amputated and sent as a specimen.  We then repaired the  direct hernia as well, suturing transversus abdominis and transversalis fascia to Cooper ligament and Poupart ligament with interrupted 2-0 Bralon sutures.  A relaxing incision was carried out before cinching these tightly.  The cord structures were then returned to their anatomic position below the external oblique and the external oblique was repaired over them using a running 3-0 Polysorb suture.  The wound was irrigated with normal saline solution and I elected to use approximately 15 mL of 0.5% Sensorcaine to help with postoperative comfort.  Sterile dressing was applied after the sterile clips were placed.  Prior to closure, all sponge, needle, and instrument counts were found to be correct. Estimated blood loss was less than 25 mL.  He received approximately 500 mL of crystalloids intraoperatively.  There were no complications.     Barbaraann Barthel, M.D.     WB/MEDQ  D:  10/01/2012  T:  10/02/2012  Job:  [id number]

## 2012-10-03 NOTE — Discharge Summary (Signed)
NAME:  NIQUAN, CHARNLEY NO.:  [id number]  MEDICAL RECORD NO.:  [id number]  LOCATION:  A324                          FACILITY:  [facility]  PHYSICIAN:  Barbaraann Barthel, M.D. DATE OF BIRTH:  1965-11-15  DATE OF ADMISSION:  10/01/2012 DATE OF DISCHARGE:  09/23/2014LH                              DISCHARGE SUMMARY   DIAGNOSIS:  Right inguinal hernia.  PROCEDURE:  On October 01, 2012, right inguinal herniorrhaphy (modified McVay repair without the use of prosthesis).  Note, this is a 47 year old white male who presented to the office with a right inguinal hernia that had been present for several years.  We had planned to take care of this via the outpatient department.  We discussed complications not limited to, but including bleeding, infection, and recurrence and informed consent was obtained.  He was taken to surgery via the outpatient department and an uneventful right inguinal herniorrhaphy was carried out.  His hospital course was completely uneventful.  I did keep him in the hospital for observation because there was some question of his support at home.  He has a mentally challenged wife and he himself is a little mentally challenged and we had to coordinate some transportation for him, however, he stayed under less than the 24-hour observation and he did well.  At the time of discharge, his wound was clean, he was voiding well.  He had no leg pain, shortness of breath, or any problems voiding. We discussed followup with him and we will make arrangements to follow up him perioperatively in the office.  He is told to come to the emergency room or call me should he have any acute changes.  We will follow up with him perioperatively.     Barbaraann Barthel, M.D.     WB/MEDQ  D:  10/02/2012  T:  10/03/2012  Job:  [id number]

## 2012-10-19 ENCOUNTER — Telehealth: Payer: Self-pay | Admitting: Family Medicine

## 2012-10-19 MED ORDER — ALPRAZOLAM 1 MG PO TABS: 1.0000 mg | ORAL_TABLET | Freq: Two times a day (BID) | ORAL | Status: DC | PRN

## 2012-10-19 NOTE — Telephone Encounter (Signed)
rx called in

## 2012-10-19 NOTE — Telephone Encounter (Signed)
Okay to refill? 

## 2012-10-19 NOTE — Telephone Encounter (Signed)
Alprazolam 1 mg  BID PRN  Last RF 10/01/12  OK refill?

## 2012-10-26 ENCOUNTER — Encounter: Payer: Self-pay | Admitting: Family Medicine

## 2012-10-26 ENCOUNTER — Ambulatory Visit (INDEPENDENT_AMBULATORY_CARE_PROVIDER_SITE_OTHER): Payer: Medicare Other | Admitting: Family Medicine

## 2012-10-26 VITALS — BP 120/82 | HR 80 | Resp 16 | Ht 74.0 in | Wt 170.0 lb

## 2012-10-26 DIAGNOSIS — M129 Arthropathy, unspecified: Secondary | ICD-10-CM

## 2012-10-26 DIAGNOSIS — M1990 Unspecified osteoarthritis, unspecified site: Secondary | ICD-10-CM

## 2012-10-26 DIAGNOSIS — F411 Generalized anxiety disorder: Secondary | ICD-10-CM

## 2012-10-26 DIAGNOSIS — F419 Anxiety disorder, unspecified: Secondary | ICD-10-CM

## 2012-10-26 MED ORDER — ALPRAZOLAM 1 MG PO TABS: 1.0000 mg | ORAL_TABLET | Freq: Two times a day (BID) | ORAL | Status: DC | PRN

## 2012-10-26 MED ORDER — ACETAMINOPHEN-CODEINE #3 300-30 MG PO TABS: 1.0000 | ORAL_TABLET | ORAL | Status: DC | PRN

## 2012-10-26 NOTE — Patient Instructions (Signed)
Release of records from Dr. Malvin Johns Continue current medications F/U 4 months

## 2012-10-26 NOTE — Progress Notes (Signed)
  Subjective:    Patient ID: Jacob Reed, male    DOB: 1966-01-05, 47 y.o.   MRN: [id number]  HPI  Pt here to f/u meds. No sepcific concerns Declines flu shot Had recent right inguinal hernia surgery. Did well but had allergic reaction to a medication give at post- op, he is not sure which medication this is. Was on Ultram before surgery for arthritis. He was given tylenol #3 by his dentist before surgery but did well with this despite codiene allergy listed Needs refill on xanax medication   Review of Systems  GEN- denies fatigue, fever, weight loss,weakness, recent illness HEENT- denies eye drainage, change in vision, nasal discharge, CVS- denies chest pain, palpitations RESP- denies SOB, cough, wheeze ABD- denies N/V, change in stools, abd pain GU- denies dysuria, hematuria, dribbling, incontinence MSK- denies joint pain, muscle aches, injury Neuro- denies headache, dizziness, syncope, seizure activity      Objective:   Physical Exam  GEN- NAD, alert and oriented x3 HEENT- PERRL, EOMI, non injected sclera, pink conjunctiva, MMM, oropharynx clear CVS- RRR, no murmur RESP-CTAB EXT- No edema Pulses- Radial 2+ Psych- normal affect and mood       Assessment & Plan:

## 2012-10-28 NOTE — Assessment & Plan Note (Signed)
Has taken ultram ? If this caused an allergy recently Given 30 tablets of tylenol #3 with recent hernia surgery He will call back and let me know which medication caused problems

## 2012-10-28 NOTE — Assessment & Plan Note (Signed)
Doing well on meds, refilled

## 2012-10-31 ENCOUNTER — Telehealth: Payer: Self-pay | Admitting: Family Medicine

## 2012-10-31 MED ORDER — TRAMADOL HCL 50 MG PO TABS: 50.0000 mg | ORAL_TABLET | Freq: Four times a day (QID) | ORAL | Status: DC | PRN

## 2012-10-31 NOTE — Telephone Encounter (Signed)
rx called in

## 2012-10-31 NOTE — Telephone Encounter (Signed)
Approved  for #60 with 0 refills. 

## 2012-10-31 NOTE — Telephone Encounter (Signed)
Tramodol 50 mg  TID PRN  #60   Last refill 10/02/12  #30  Last OV 10/26/12

## 2012-11-26 ENCOUNTER — Ambulatory Visit: Payer: Medicare Other | Admitting: Family Medicine

## 2012-11-29 ENCOUNTER — Encounter (HOSPITAL_COMMUNITY): Payer: Self-pay | Admitting: Emergency Medicine

## 2012-11-29 ENCOUNTER — Emergency Department (HOSPITAL_COMMUNITY)
Admission: EM | Admit: 2012-11-29 | Discharge: 2012-11-29 | Disposition: A | Discharge status: Home / Self Care | Payer: Medicare Other | Attending: Emergency Medicine | Admitting: Emergency Medicine

## 2012-11-29 DIAGNOSIS — J069 Acute upper respiratory infection, unspecified: Secondary | ICD-10-CM | POA: Diagnosis not present

## 2012-11-29 DIAGNOSIS — Z8739 Personal history of other diseases of the musculoskeletal system and connective tissue: Secondary | ICD-10-CM | POA: Insufficient documentation

## 2012-11-29 DIAGNOSIS — R000 Tachycardia, unspecified: Secondary | ICD-10-CM | POA: Diagnosis not present

## 2012-11-29 DIAGNOSIS — Z8719 Personal history of other diseases of the digestive system: Secondary | ICD-10-CM | POA: Insufficient documentation

## 2012-11-29 DIAGNOSIS — J40 Bronchitis, not specified as acute or chronic: Secondary | ICD-10-CM | POA: Insufficient documentation

## 2012-11-29 DIAGNOSIS — F411 Generalized anxiety disorder: Secondary | ICD-10-CM | POA: Insufficient documentation

## 2012-11-29 DIAGNOSIS — J3489 Other specified disorders of nose and nasal sinuses: Secondary | ICD-10-CM | POA: Diagnosis present

## 2012-11-29 DIAGNOSIS — Z87891 Personal history of nicotine dependence: Secondary | ICD-10-CM | POA: Insufficient documentation

## 2012-11-29 MED ORDER — PSEUDOEPHEDRINE HCL 60 MG PO TABS: 60.0000 mg | ORAL_TABLET | Freq: Three times a day (TID) | ORAL | Status: DC | PRN

## 2012-11-29 MED ORDER — AZITHROMYCIN 250 MG PO TABS: 250.0000 mg | ORAL_TABLET | Freq: Every day | ORAL | Status: DC

## 2012-11-29 NOTE — ED Provider Notes (Signed)
CSN: [id number]     Arrival date & time 11/29/12  1426 History   First MD Initiated Contact with Patient 11/29/12 1438     Chief Complaint  Patient presents with  . Nasal Congestion   (Consider location/radiation/quality/duration/timing/severity/associated sxs/prior Treatment) Patient is a 47 y.o. male presenting with URI. The history is provided by the patient.  URI Presenting symptoms: congestion   Presenting symptoms: no ear pain, no fever and no sore throat   Severity:  Moderate Onset quality:  Gradual Duration:  5 days Progression:  Worsening Chronicity:  New Relieved by:  Nothing Worsened by:  Nothing tried Ineffective treatments:  None tried Associated symptoms: myalgias, sinus pain and sneezing   Associated symptoms: no headaches, no swollen glands and no wheezing   Risk factors: sick contacts   Risk factors: no chronic kidney disease, no chronic respiratory disease, no diabetes mellitus, no recent illness and no recent travel    Jacob Reed is a 47 y.o. male who presents to the ED with cough and congestion and nasal drainage x 5 days. He has taken nothing for his symptoms.  Past Medical History  Diagnosis Date  . Arthritis   . Anxiety   . Sinusitis   . Back pain   . Dental caries    Past Surgical History  Procedure Laterality Date  . Vasectomy    . Tooth extraction    . Inguinal hernia repair Right 10/01/2012    Procedure: HERNIA REPAIR INGUINAL ADULT;  Surgeon: Marlane Hatcher, MD;  Location: [facility];  Service: General;  Laterality: Right;   Family History  Problem Relation Age of Onset  . Heart disease Father    History  Substance Use Topics  . Smoking status: Former Games developer  . Smokeless tobacco: Current User    Types: Snuff  . Alcohol Use: No    Review of Systems  Constitutional: Negative for fever and chills.  HENT: Positive for congestion, sinus pressure and sneezing. Negative for ear pain, sore throat and trouble swallowing.   Eyes: Negative  for redness and itching.  Respiratory: Negative for wheezing.   Gastrointestinal: Negative for nausea, vomiting and abdominal pain.  Genitourinary: Negative for dysuria, urgency and frequency.  Musculoskeletal: Positive for myalgias. Negative for back pain.  Skin: Negative for rash.  Neurological: Negative for dizziness and headaches.  Psychiatric/Behavioral: The patient is not nervous/anxious.     Allergies  Review of patient's allergies indicates no known allergies.  Home Medications   Current Outpatient Rx  Name  Route  Sig  Dispense  Refill  . ALPRAZolam (XANAX) 1 MG tablet   Oral   Take 1 mg by mouth at bedtime as needed for anxiety or sleep.         . traMADol (ULTRAM) 50 MG tablet   Oral   Take 1 tablet (50 mg total) by mouth every 6 (six) hours as needed for pain.   60 tablet   0    BP 102/80  Pulse 108  Temp(Src) 98.2 F (36.8 C) (Oral)  Resp 18  Ht 6\' 2"  (1.88 m)  Wt 173 lb (78.472 kg)  BMI 22.20 kg/m2  SpO2 100% Physical Exam  Nursing note and vitals reviewed. Constitutional: He is oriented to person, place, and time. He appears well-developed and well-nourished. No distress.  HENT:  Head: Normocephalic and atraumatic.  Nose: Rhinorrhea present. No mucosal edema or nasal deformity. No epistaxis. Right sinus exhibits maxillary sinus tenderness. Left sinus exhibits maxillary sinus tenderness.  Eyes:  Conjunctivae and EOM are normal.  Neck: Normal range of motion. Neck supple.  Cardiovascular: Regular rhythm.  Tachycardia present.   Pulmonary/Chest: Effort normal and breath sounds normal. He has no wheezes. He has no rhonchi. He has no rales.  Abdominal: Soft.  Musculoskeletal: Normal range of motion.  Neurological: He is alert and oriented to person, place, and time. No cranial nerve deficit.  Skin: Skin is warm and dry.  Psychiatric: He has a normal mood and affect. His behavior is normal.    ED Course  Procedures  EKG Interpretation   None       MDM  47 y.o. male with congestion and sinus tenderness. Will treat with Zithromax and Sudafed. Patient to follow up with his PCP. He will return here as needed. Stable for discharge without any immediate complications. Vital signs stable and O2 SAT 100% on R/A. Discussed with the patient and all questioned fully answered.    Medication List    TAKE these medications       azithromycin 250 MG tablet  Commonly known as:  ZITHROMAX  Take 1 tablet (250 mg total) by mouth daily. Take first 2 tablets together, then 1 every day until finished.     pseudoephedrine 60 MG tablet  Commonly known as:  SUDAFED  Take 1 tablet (60 mg total) by mouth every 8 (eight) hours as needed for congestion.      ASK your doctor about these medications       ALPRAZolam 1 MG tablet  Commonly known as:  XANAX  Take 1 mg by mouth at bedtime as needed for anxiety or sleep.     traMADol 50 MG tablet  Commonly known as:  ULTRAM  Take 1 tablet (50 mg total) by mouth every 6 (six) hours as needed for pain.         Janne Napoleon, NP 11/29/12 1630

## 2012-11-29 NOTE — ED Notes (Signed)
Nasal congestion x 4-5 days.  Denies fever.

## 2012-11-29 NOTE — ED Notes (Signed)
PT complains of sinus congestion for the past 3-4 days. PT reports pressure/pain 8/10 at night and 5-6/10 during the day. Audible fine crackles RUL during respiratory assessment noted. PT also mentioned that he broke out in hives from a medication given for pain post-surgically on 10/02/12. PT states he didn't remember the name of the medication, but was able to tell me it was for pain and was prescribed on 9/23 by prescribing MD. The only pain medication ordered was Tramadol and PT stated that was the medication when I told him the name. He stated that his reaction was hives and that the called the MD who told him to stop taking the medication. Medication added to allergy list 11/29/12

## 2012-11-30 NOTE — ED Provider Notes (Signed)
  Medical screening examination/treatment/procedure(s) were performed by non-physician practitioner and as supervising physician I was immediately available for consultation/collaboration.      Milley Vining, MD 11/30/12 0756 

## 2012-12-25 ENCOUNTER — Emergency Department (HOSPITAL_COMMUNITY)
Admission: EM | Admit: 2012-12-25 | Discharge: 2012-12-25 | Disposition: A | Discharge status: Home / Self Care | Payer: PRIVATE HEALTH INSURANCE | Attending: Emergency Medicine | Admitting: Emergency Medicine

## 2012-12-25 ENCOUNTER — Encounter (HOSPITAL_COMMUNITY): Payer: Self-pay | Admitting: Emergency Medicine

## 2012-12-25 DIAGNOSIS — B86 Scabies: Secondary | ICD-10-CM | POA: Insufficient documentation

## 2012-12-25 DIAGNOSIS — Z87891 Personal history of nicotine dependence: Secondary | ICD-10-CM | POA: Insufficient documentation

## 2012-12-25 DIAGNOSIS — Z792 Long term (current) use of antibiotics: Secondary | ICD-10-CM | POA: Insufficient documentation

## 2012-12-25 DIAGNOSIS — Z8719 Personal history of other diseases of the digestive system: Secondary | ICD-10-CM | POA: Insufficient documentation

## 2012-12-25 DIAGNOSIS — F411 Generalized anxiety disorder: Secondary | ICD-10-CM | POA: Insufficient documentation

## 2012-12-25 DIAGNOSIS — Z8709 Personal history of other diseases of the respiratory system: Secondary | ICD-10-CM | POA: Insufficient documentation

## 2012-12-25 DIAGNOSIS — Z8739 Personal history of other diseases of the musculoskeletal system and connective tissue: Secondary | ICD-10-CM | POA: Insufficient documentation

## 2012-12-25 MED ORDER — PERMETHRIN 5 % EX CREA: TOPICAL_CREAM | CUTANEOUS | Status: DC

## 2012-12-25 NOTE — ED Provider Notes (Signed)
Medical screening examination/treatment/procedure(s) were performed by non-physician practitioner and as supervising physician I was immediately available for consultation/collaboration.  EKG Interpretation   None        Yanique Mulvihill, MD 12/25/12 1508 

## 2012-12-25 NOTE — ED Notes (Signed)
Alert, NAd, Itching rash to arms and trunk for 2  Weeks, says he has been using benadryl cream without relief.

## 2012-12-25 NOTE — ED Notes (Signed)
Pt c/o generalized itchy rash

## 2012-12-25 NOTE — ED Provider Notes (Signed)
CSN: [id number]     Arrival date & time 12/25/12  1210 History   First MD Initiated Contact with Patient 12/25/12 1218     Chief Complaint  Patient presents with  . Rash   (Consider location/radiation/quality/duration/timing/severity/associated sxs/prior Treatment) HPI Comments: Patient presents with a chief complaint of rash.  He reports that the rash has been present for the past 3 weeks.  Rash located on the chest, abdomen, and both arms.  He reports that the rash is not painful, but does severely itch.  He has been applying Benadryl Anti Itch cream to the rash without improvement.  He states that his wife and also another girl that he knows has a similar rash.  He denies fever or chills.  Denies nausea or vomiting.  Denies headache or sore throat.  He denies any recent changes to medications, soaps, detergents, or lotions.  The history is provided by the patient.    Past Medical History  Diagnosis Date  . Arthritis   . Anxiety   . Sinusitis   . Back pain   . Dental caries    Past Surgical History  Procedure Laterality Date  . Vasectomy    . Tooth extraction    . Inguinal hernia repair Right 10/01/2012    Procedure: HERNIA REPAIR INGUINAL ADULT;  Surgeon: Marlane Hatcher, MD;  Location: [facility];  Service: General;  Laterality: Right;   Family History  Problem Relation Age of Onset  . Heart disease Father    History  Substance Use Topics  . Smoking status: Former Games developer  . Smokeless tobacco: Current User    Types: Snuff  . Alcohol Use: No    Review of Systems  Skin: Positive for rash.  All other systems reviewed and are negative.    Allergies  Tramadol  Home Medications   Current Outpatient Rx  Name  Route  Sig  Dispense  Refill  . ALPRAZolam (XANAX) 1 MG tablet   Oral   Take 1 mg by mouth at bedtime as needed for anxiety or sleep.         Marland Kitchen azithromycin (ZITHROMAX) 250 MG tablet   Oral   Take 1 tablet (250 mg total) by mouth daily. Take first 2  tablets together, then 1 every day until finished.   6 tablet   0   . pseudoephedrine (SUDAFED) 60 MG tablet   Oral   Take 1 tablet (60 mg total) by mouth every 8 (eight) hours as needed for congestion.   20 tablet   0   . traMADol (ULTRAM) 50 MG tablet   Oral   Take 1 tablet (50 mg total) by mouth every 6 (six) hours as needed for pain.   60 tablet   0    BP 139/97  Pulse 105  Temp(Src) 97.5 F (36.4 C)  Resp 18  Ht 6\' 2"  (1.88 m)  Wt 173 lb (78.472 kg)  BMI 22.20 kg/m2  SpO2 100% Physical Exam  Nursing note and vitals reviewed. Constitutional: He appears well-developed and well-nourished.  HENT:  Head: Normocephalic and atraumatic.  Mouth/Throat: Oropharynx is clear and moist.  Neck: Normal range of motion. Neck supple.  Cardiovascular: Normal rate, regular rhythm and normal heart sounds.   Pulmonary/Chest: Effort normal and breath sounds normal.  Abdominal: Soft. Bowel sounds are normal.  Musculoskeletal: Normal range of motion.  Neurological: He is alert.  Skin: Skin is warm and dry.  Diffuse erythematous papules located on both arms, lower back, and chest.  No rash on the lower extremities, hands, or face.  Psychiatric: He has a normal mood and affect.    ED Course  Procedures (including critical care time) Labs Review Labs Reviewed - No data to display Imaging Review No results found.  EKG Interpretation   None       MDM  No diagnosis found. Patient presenting with pruritic rash that has been present for 3 weeks.  Patient has been in contact with individuals with similar rash.  Distribution is classic for Scabies, but due to the fact that others have similar rash and that the rash does itch will treat for Scabies with Permethrin cream.  Patient is afebrile and non toxic appearing.  Feel that the patient is stable for discharge.  Return precautions given.   Santiago Glad, PA-C 12/25/12 1344

## 2012-12-26 ENCOUNTER — Ambulatory Visit: Payer: Medicare Other | Admitting: Family Medicine

## 2012-12-27 ENCOUNTER — Other Ambulatory Visit: Payer: Self-pay | Admitting: Family Medicine

## 2012-12-27 NOTE — Telephone Encounter (Signed)
See no refills in Epic.  Last OV 10/26/12  OK refill?

## 2013-01-08 ENCOUNTER — Ambulatory Visit: Payer: Medicare Other | Admitting: Family Medicine

## 2013-02-21 ENCOUNTER — Telehealth: Payer: Self-pay | Admitting: *Deleted

## 2013-02-21 NOTE — Telephone Encounter (Signed)
Ok to refill 

## 2013-02-22 ENCOUNTER — Telehealth: Payer: Self-pay | Admitting: Family Medicine

## 2013-02-22 MED ORDER — ALPRAZOLAM 1 MG PO TABS: ORAL_TABLET | ORAL | Status: DC

## 2013-02-22 NOTE — Telephone Encounter (Signed)
Tried calling pt, med was refilled on 02/21/13, pt phone number not working

## 2013-02-22 NOTE — Telephone Encounter (Signed)
Meds refilled.

## 2013-02-22 NOTE — Telephone Encounter (Signed)
Needs Xanax Rx

## 2013-02-22 NOTE — Telephone Encounter (Signed)
Okay to refill? 

## 2013-02-26 ENCOUNTER — Ambulatory Visit: Payer: Medicare Other | Admitting: Family Medicine

## 2013-03-22 ENCOUNTER — Encounter (HOSPITAL_COMMUNITY): Payer: Self-pay | Admitting: Emergency Medicine

## 2013-03-22 ENCOUNTER — Emergency Department (HOSPITAL_COMMUNITY)
Admission: EM | Admit: 2013-03-22 | Discharge: 2013-03-22 | Disposition: A | Discharge status: Home / Self Care | Payer: PRIVATE HEALTH INSURANCE | Attending: Emergency Medicine | Admitting: Emergency Medicine

## 2013-03-22 DIAGNOSIS — Z8709 Personal history of other diseases of the respiratory system: Secondary | ICD-10-CM | POA: Insufficient documentation

## 2013-03-22 DIAGNOSIS — Z8719 Personal history of other diseases of the digestive system: Secondary | ICD-10-CM | POA: Insufficient documentation

## 2013-03-22 DIAGNOSIS — Z79899 Other long term (current) drug therapy: Secondary | ICD-10-CM | POA: Insufficient documentation

## 2013-03-22 DIAGNOSIS — G8929 Other chronic pain: Secondary | ICD-10-CM | POA: Insufficient documentation

## 2013-03-22 DIAGNOSIS — M542 Cervicalgia: Secondary | ICD-10-CM | POA: Insufficient documentation

## 2013-03-22 DIAGNOSIS — M545 Low back pain, unspecified: Secondary | ICD-10-CM

## 2013-03-22 DIAGNOSIS — Z8739 Personal history of other diseases of the musculoskeletal system and connective tissue: Secondary | ICD-10-CM | POA: Insufficient documentation

## 2013-03-22 DIAGNOSIS — F411 Generalized anxiety disorder: Secondary | ICD-10-CM | POA: Insufficient documentation

## 2013-03-22 DIAGNOSIS — Z87891 Personal history of nicotine dependence: Secondary | ICD-10-CM | POA: Insufficient documentation

## 2013-03-22 MED ORDER — MELOXICAM 15 MG PO TABS: 15.0000 mg | ORAL_TABLET | Freq: Every day | ORAL | Status: DC

## 2013-03-22 MED ORDER — CYCLOBENZAPRINE HCL 10 MG PO TABS: 10.0000 mg | ORAL_TABLET | Freq: Three times a day (TID) | ORAL | Status: DC | PRN

## 2013-03-22 NOTE — ED Notes (Signed)
Pain in back and neck since yesterday. Hx of chronic pain, says worse since "almost fell yesterday" on the way to mailbox

## 2013-03-22 NOTE — Discharge Instructions (Signed)
Back Pain, Adult  Back pain is very common. The pain often gets better over time. The cause of back pain is usually not dangerous. Most people can learn to manage their back pain on their own.   HOME CARE   · Stay active. Start with short walks on flat ground if you can. Try to walk farther each day.  · Do not sit, drive, or stand in one place for more than 30 minutes. Do not stay in bed.  · Do not avoid exercise or work. Activity can help your back heal faster.  · Be careful when you bend or lift an object. Bend at your knees, keep the object close to you, and do not twist.  · Sleep on a firm mattress. Lie on your side, and bend your knees. If you lie on your back, put a pillow under your knees.  · Only take medicines as told by your doctor.  · Put ice on the injured area.  · Put ice in a plastic bag.  · Place a towel between your skin and the bag.  · Leave the ice on for 15-20 minutes, 03-04 times a day for the first 2 to 3 days. After that, you can switch between ice and heat packs.  · Ask your doctor about back exercises or massage.  · Avoid feeling anxious or stressed. Find good ways to deal with stress, such as exercise.  GET HELP RIGHT AWAY IF:   · Your pain does not go away with rest or medicine.  · Your pain does not go away in 1 week.  · You have new problems.  · You do not feel well.  · The pain spreads into your legs.  · You cannot control when you poop (bowel movement) or pee (urinate).  · Your arms or legs feel weak or lose feeling (numbness).  · You feel sick to your stomach (nauseous) or throw up (vomit).  · You have belly (abdominal) pain.  · You feel like you may pass out (faint).  MAKE SURE YOU:   · Understand these instructions.  · Will watch your condition.  · Will get help right away if you are not doing well or get worse.  Document Released: 06/15/2007 Document Revised: 03/21/2011 Document Reviewed: 05/17/2010  [employer]® Patient Information ©2014 [employer].

## 2013-03-22 NOTE — ED Provider Notes (Signed)
CSN: [id number]     Arrival date & time 03/22/13  1102 History   First MD Initiated Contact with Patient 03/22/13 1148     Chief Complaint  Patient presents with  . Back Pain     (Consider location/radiation/quality/duration/timing/severity/associated sxs/prior Treatment) Patient is a 48 y.o. male presenting with back pain. The history is provided by the patient.  Back Pain Location:  Lumbar spine Quality:  Aching Radiates to:  Does not radiate Pain severity:  Moderate Pain is:  Same all the time Onset quality:  Unable to specify Timing:  Intermittent Progression:  Worsening Chronicity:  Chronic Context: twisting   Context: not falling, not lifting heavy objects and not recent injury   Relieved by:  Bed rest Worsened by:  Ambulation, bending and twisting Ineffective treatments:  None tried Associated symptoms: no abdominal pain, no abdominal swelling, no bladder incontinence, no bowel incontinence, no chest pain, no dysuria, no fever, no headaches, no leg pain, no numbness, no paresthesias, no pelvic pain, no perianal numbness, no tingling and no weakness    Patient also c/o acute on chronic right neck pain.  States pain has been worsening for several weeks.  Pain is reproduced with rotation of his neck and improves with rest.  He denies injury, headaches, dizziness, UE numbness or weakness.  Past Medical History  Diagnosis Date  . Arthritis   . Anxiety   . Sinusitis   . Back pain   . Dental caries    Past Surgical History  Procedure Laterality Date  . Vasectomy    . Tooth extraction    . Inguinal hernia repair Right 10/01/2012    Procedure: HERNIA REPAIR INGUINAL ADULT;  Surgeon: Scherry Ran, MD;  Location: [facility];  Service: General;  Laterality: Right;   Family History  Problem Relation Age of Onset  . Heart disease Father    History  Substance Use Topics  . Smoking status: Former Research scientist (life sciences)  . Smokeless tobacco: Current User    Types: Snuff  . Alcohol Use: No     Review of Systems  Constitutional: Negative for fever.  Respiratory: Negative for shortness of breath.   Cardiovascular: Negative for chest pain.  Gastrointestinal: Negative for vomiting, abdominal pain, constipation and bowel incontinence.  Genitourinary: Negative for bladder incontinence, dysuria, hematuria, flank pain, decreased urine volume, difficulty urinating and pelvic pain.       No perineal numbness or incontinence of urine or feces  Musculoskeletal: Positive for back pain and neck pain. Negative for joint swelling and neck stiffness.  Skin: Negative for rash.  Neurological: Negative for dizziness, tingling, syncope, speech difficulty, weakness, numbness, headaches and paresthesias.  All other systems reviewed and are negative.      Allergies  Tramadol  Home Medications   Current Outpatient Rx  Name  Route  Sig  Dispense  Refill  . ALPRAZolam (XANAX) 1 MG tablet   Oral   Take 1 mg by mouth 2 (two) times daily.          BP 115/79  Pulse 85  Temp(Src) 98.1 F (36.7 C) (Oral)  Resp 20  Ht 6\' 2"  (1.88 m)  Wt 165 lb (74.844 kg)  BMI 21.18 kg/m2  SpO2 98% Physical Exam  Nursing note and vitals reviewed. Constitutional: He is oriented to person, place, and time. He appears well-developed and well-nourished. No distress.  HENT:  Head: Normocephalic and atraumatic.  Neck: Normal range of motion. Neck supple.  Cardiovascular: Normal rate, regular rhythm, normal heart sounds  and intact distal pulses.   No murmur heard. Pulmonary/Chest: Effort normal and breath sounds normal. No respiratory distress.  Abdominal: Soft. He exhibits no distension. There is no tenderness.  Musculoskeletal: He exhibits tenderness. He exhibits no edema.       Lumbar back: He exhibits tenderness and pain. He exhibits normal range of motion, no swelling, no deformity, no laceration and normal pulse.       Back:  ttp of the lumbar paraspinal muscles.  No spinal tenderness.  Also has  ttp of the right cervical paraspinal muscles. DP and radial pulses are brisk and symmetrical.  Distal sensation intact.  Hip Flexors/Extensors are intact.  Pt has full ROM of bilateral UE's  Neurological: He is alert and oriented to person, place, and time. He has normal strength. No sensory deficit. He exhibits normal muscle tone. Coordination and gait normal.  Reflex Scores:      Tricep reflexes are 2+ on the right side and 2+ on the left side.      Bicep reflexes are 2+ on the right side and 2+ on the left side.      Patellar reflexes are 2+ on the right side and 2+ on the left side.      Achilles reflexes are 2+ on the right side and 2+ on the left side. Skin: Skin is warm and dry. No rash noted.    ED Course  Procedures (including critical care time) Labs Review Labs Reviewed - No data to display Imaging Review No results found.   EKG Interpretation None      MDM   Final diagnoses:  Neck pain, chronic  Low back pain   Patient reviewed on [place] narcotics database, had Rx for xanax fill 02/22/13 with 2 refills.    Pt with acute on chronic low back and chronic right neck pain.  No concerning sx's for emergent neurological or infectious process. Pt agrees to symptomatic treatment and close f/u with PMD  VSS pt appears stable for d/c  Suellyn Meenan L. Vanessa Ridgeway, PA-C 03/23/13 1820

## 2013-03-25 NOTE — ED Provider Notes (Signed)
Medical screening examination/treatment/procedure(s) were performed by non-physician practitioner and as supervising physician I was immediately available for consultation/collaboration.   EKG Interpretation None        Alfonzo Feller, DO 03/25/13 1818

## 2013-04-12 ENCOUNTER — Other Ambulatory Visit: Payer: Self-pay | Admitting: Family Medicine

## 2013-04-12 ENCOUNTER — Encounter: Payer: Medicare Other | Admitting: Family Medicine

## 2013-04-12 NOTE — Telephone Encounter (Signed)
Medication called to pharmacy.  Appointment re-scheduled for 04/24/2013.

## 2013-04-12 NOTE — Telephone Encounter (Signed)
He had meds refilled in Feb, FYI. Give 30 tablets only, no refills, he needs to reschedule appt, he did not show up today

## 2013-04-12 NOTE — Telephone Encounter (Signed)
Ok to refill??  Last office visit 10/26/2012.  Has not had medication refilled by [facility].

## 2013-04-19 ENCOUNTER — Emergency Department (HOSPITAL_COMMUNITY)
Admission: EM | Admit: 2013-04-19 | Discharge: 2013-04-19 | Disposition: A | Discharge status: Home / Self Care | Payer: PRIVATE HEALTH INSURANCE | Attending: Emergency Medicine | Admitting: Emergency Medicine

## 2013-04-19 ENCOUNTER — Encounter (HOSPITAL_COMMUNITY): Payer: Self-pay | Admitting: Emergency Medicine

## 2013-04-19 DIAGNOSIS — K644 Residual hemorrhoidal skin tags: Secondary | ICD-10-CM | POA: Insufficient documentation

## 2013-04-19 DIAGNOSIS — Z87891 Personal history of nicotine dependence: Secondary | ICD-10-CM | POA: Insufficient documentation

## 2013-04-19 DIAGNOSIS — F411 Generalized anxiety disorder: Secondary | ICD-10-CM | POA: Insufficient documentation

## 2013-04-19 DIAGNOSIS — Z8719 Personal history of other diseases of the digestive system: Secondary | ICD-10-CM | POA: Insufficient documentation

## 2013-04-19 DIAGNOSIS — K649 Unspecified hemorrhoids: Secondary | ICD-10-CM

## 2013-04-19 DIAGNOSIS — Z8739 Personal history of other diseases of the musculoskeletal system and connective tissue: Secondary | ICD-10-CM | POA: Insufficient documentation

## 2013-04-19 DIAGNOSIS — Z79899 Other long term (current) drug therapy: Secondary | ICD-10-CM | POA: Insufficient documentation

## 2013-04-19 LAB — CBC WITH DIFFERENTIAL/PLATELET
Basophils Absolute: 0 10*3/uL (ref 0.0–0.1)
Basophils Relative: 0 % (ref 0–1)
Eosinophils Absolute: 0.1 10*3/uL (ref 0.0–0.7)
Eosinophils Relative: 2 % (ref 0–5)
HCT: 41.3 % (ref 39.0–52.0)
HEMOGLOBIN: 14.2 g/dL (ref 13.0–17.0)
LYMPHS ABS: 1.6 10*3/uL (ref 0.7–4.0)
Lymphocytes Relative: 29 % (ref 12–46)
MCH: 30.3 pg (ref 26.0–34.0)
MCHC: 34.4 g/dL (ref 30.0–36.0)
MCV: 88.1 fL (ref 78.0–100.0)
Monocytes Absolute: 0.4 10*3/uL (ref 0.1–1.0)
Monocytes Relative: 8 % (ref 3–12)
NEUTROS ABS: 3.2 10*3/uL (ref 1.7–7.7)
NEUTROS PCT: 60 % (ref 43–77)
Platelets: 148 10*3/uL — ABNORMAL LOW (ref 150–400)
RBC: 4.69 MIL/uL (ref 4.22–5.81)
RDW: 12.9 % (ref 11.5–15.5)
WBC: 5.4 10*3/uL (ref 4.0–10.5)

## 2013-04-19 LAB — BASIC METABOLIC PANEL
BUN: 8 mg/dL (ref 6–23)
CO2: 30 mEq/L (ref 19–32)
CREATININE: 1.12 mg/dL (ref 0.50–1.35)
Calcium: 8.9 mg/dL (ref 8.4–10.5)
Chloride: 102 mEq/L (ref 96–112)
GFR calc Af Amer: 88 mL/min — ABNORMAL LOW (ref >90)
GFR calc non Af Amer: 76 mL/min — ABNORMAL LOW (ref >90)
GLUCOSE: 121 mg/dL — AB (ref 70–99)
POTASSIUM: 4 meq/L (ref 3.7–5.3)
Sodium: 141 mEq/L (ref 137–147)

## 2013-04-19 MED ORDER — HYDROCORTISONE 2.5 % RE CREA: TOPICAL_CREAM | RECTAL | Status: DC

## 2013-04-19 MED ORDER — DOCUSATE SODIUM 100 MG PO CAPS: 100.0000 mg | ORAL_CAPSULE | Freq: Two times a day (BID) | ORAL | Status: DC

## 2013-04-19 NOTE — ED Notes (Signed)
Pt has HX of hemorrhoids but they have never produced much blood. Pt states he noticed dark red blood last night and this morning with BM. Pt denies dizziness, N/V/D. Co weakness.

## 2013-04-19 NOTE — Discharge Instructions (Signed)
Follow up with dr. Romona Curls next week.  Sit in a bath tube 3 times a day for 15  minutes

## 2013-04-19 NOTE — ED Provider Notes (Signed)
CSN: [id number]     Arrival date & time 04/19/13  5277 History  This chart was scribed for Maudry Diego, MD by Delphia Grates, ED Scribe. This patient was seen in room [facility] and the patient's care was started at 9:53 AM.    Chief Complaint  Patient presents with  . Rectal Bleeding    Patient is a 48 y.o. male presenting with hematochezia. The history is provided by the patient. No language interpreter was used.  Rectal Bleeding Quality:  Bright red Amount:  Scant Duration:  1 day Timing:  Constant Progression:  Worsening Chronicity:  New Context: hemorrhoids and spontaneously   Context: not rectal injury and not rectal pain   Similar prior episodes: no   Relieved by:  Hemorrhoid cream Associated symptoms: no abdominal pain, no dizziness and no vomiting    HPI Comments: Jacob Reed is a 48 y.o. male who presents to the Emergency Department complaining of gradual onset, gradually worsening rectal bleeding that began last night. Pt states that he noticed this morning blood present in his stool and on a wash rag upon cleaning the area. He states there is associated itching to the perirectal area. He reports using Preparation H with relief. He denies nausea, vomiting, diarrhea, and rectal pain.  Pt has history of inguinal hernia repair performed by Dr. Romona Curls in September 2014.   Past Medical History  Diagnosis Date  . Arthritis   . Anxiety   . Sinusitis   . Back pain   . Dental caries    Past Surgical History  Procedure Laterality Date  . Vasectomy    . Tooth extraction    . Inguinal hernia repair Right 10/01/2012    Procedure: HERNIA REPAIR INGUINAL ADULT;  Surgeon: Scherry Ran, MD;  Location: [facility];  Service: General;  Laterality: Right;   Family History  Problem Relation Age of Onset  . Heart disease Father    History  Substance Use Topics  . Smoking status: Former Research scientist (life sciences)  . Smokeless tobacco: Current User    Types: Snuff  . Alcohol Use: No     Review of Systems  Constitutional: Negative for appetite change and fatigue.  HENT: Negative for congestion, ear discharge and sinus pressure.   Eyes: Negative for discharge.  Respiratory: Negative for cough.   Cardiovascular: Negative for chest pain.  Gastrointestinal: Positive for hematochezia and anal bleeding (and itching). Negative for nausea, vomiting, abdominal pain, diarrhea and rectal pain.  Genitourinary: Negative for frequency and hematuria.  Musculoskeletal: Negative for back pain.  Skin: Negative for rash.  Neurological: Negative for dizziness, seizures and headaches.  Psychiatric/Behavioral: Negative for hallucinations.      Allergies  Tramadol  Home Medications   Current Outpatient Rx  Name  Route  Sig  Dispense  Refill  . acetaminophen (TYLENOL) 500 MG tablet   Oral   Take 250 mg by mouth daily as needed for moderate pain.         Marland Kitchen ALPRAZolam (XANAX) 1 MG tablet   Oral   Take 1 mg by mouth at bedtime as needed for sleep.          BP 103/77  Pulse 72  Temp(Src) 97.8 F (36.6 C) (Oral)  Resp 18  Ht 6\' 2"  (1.88 m)  Wt 173 lb (78.472 kg)  BMI 22.20 kg/m2  SpO2 98% Physical Exam  Nursing note and vitals reviewed. Constitutional: He is oriented to person, place, and time. He appears well-developed.  HENT:  Head:  Normocephalic.  Eyes: Conjunctivae and EOM are normal. No scleral icterus.  Neck: Neck supple. No thyromegaly present.  Cardiovascular: Normal rate, regular rhythm and normal heart sounds.  Exam reveals no gallop and no friction rub.   No murmur heard. Pulmonary/Chest: Effort normal and breath sounds normal. No stridor. He has no wheezes. He has no rales. He exhibits no tenderness.  Abdominal: He exhibits no distension. There is no tenderness. There is no rebound.  Genitourinary: Rectal exam shows external hemorrhoid.  Bleeding external hemorrhoid.  Musculoskeletal: Normal range of motion. He exhibits no edema.  Lymphadenopathy:     He has no cervical adenopathy.  Neurological: He is oriented to person, place, and time. He exhibits normal muscle tone. Coordination normal.  Skin: No rash noted. No erythema.  Psychiatric: He has a normal mood and affect. His behavior is normal.    ED Course  Procedures (including critical care time) DIAGNOSTIC STUDIES: Oxygen Saturation is 98% on RA, noraml by my interpretation.    COORDINATION OF CARE: 9:58 AM- Will order CBC and BMP. Pt advised of plan for treatment and pt agrees.   Labs Review Labs Reviewed  CBC WITH DIFFERENTIAL - Abnormal; Notable for the following:    Platelets 148 (*)    All other components within normal limits  BASIC METABOLIC PANEL - Abnormal; Notable for the following:    Glucose, Bld 121 (*)    GFR calc non Af Amer 76 (*)    GFR calc Af Amer 88 (*)    All other components within normal limits   Imaging Review No results found.   EKG Interpretation None      MDM   Final diagnoses:  None    The chart was scribed for me under my direct supervision.  I personally performed the history, physical, and medical decision making and all procedures in the evaluation of this patient.Maudry Diego, MD 04/19/13 [phone]

## 2013-04-24 ENCOUNTER — Ambulatory Visit (INDEPENDENT_AMBULATORY_CARE_PROVIDER_SITE_OTHER): Payer: Medicare Other | Admitting: Family Medicine

## 2013-04-24 ENCOUNTER — Encounter: Payer: Self-pay | Admitting: Family Medicine

## 2013-04-24 VITALS — BP 122/74 | HR 76 | Temp 97.9°F | Resp 16 | Ht 69.0 in | Wt 169.0 lb

## 2013-04-24 DIAGNOSIS — Z0000 Encounter for general adult medical examination without abnormal findings: Secondary | ICD-10-CM

## 2013-04-24 DIAGNOSIS — K644 Residual hemorrhoidal skin tags: Secondary | ICD-10-CM

## 2013-04-24 DIAGNOSIS — J302 Other seasonal allergic rhinitis: Secondary | ICD-10-CM

## 2013-04-24 DIAGNOSIS — J309 Allergic rhinitis, unspecified: Secondary | ICD-10-CM

## 2013-04-24 DIAGNOSIS — K921 Melena: Secondary | ICD-10-CM | POA: Insufficient documentation

## 2013-04-24 DIAGNOSIS — R42 Dizziness and giddiness: Secondary | ICD-10-CM

## 2013-04-24 DIAGNOSIS — Z125 Encounter for screening for malignant neoplasm of prostate: Secondary | ICD-10-CM

## 2013-04-24 DIAGNOSIS — R7309 Other abnormal glucose: Secondary | ICD-10-CM | POA: Insufficient documentation

## 2013-04-24 DIAGNOSIS — Z0001 Encounter for general adult medical examination with abnormal findings: Secondary | ICD-10-CM | POA: Insufficient documentation

## 2013-04-24 MED ORDER — ALPRAZOLAM 1 MG PO TABS: 1.0000 mg | ORAL_TABLET | Freq: Every evening | ORAL | Status: DC | PRN

## 2013-04-24 MED ORDER — CETIRIZINE HCL 10 MG PO TABS: 10.0000 mg | ORAL_TABLET | Freq: Every day | ORAL | Status: DC

## 2013-04-24 NOTE — Assessment & Plan Note (Signed)
Referral to GI for colonoscopy

## 2013-04-24 NOTE — Assessment & Plan Note (Signed)
Zyrtec 1 tablet a day

## 2013-04-24 NOTE — Progress Notes (Signed)
Patient ID: Jacob Reed, male   DOB: October 21, 1965, 48 y.o.   MRN: [id number] Subjective:   Patient presents for Medicare Annual/Subsequent preventive examination.   Pt here for CPE Meds reviewed Seen in ED for rectal bleeding, has had some bleeding noted in his sheets, as well as with  BM, given script for Anusol but was unable to get this. Denies abdominal pain associated.  Admits to dizziness on and off past 2 weeks, no headache, he did fall 2 weeks ago was just walking. +sinus pressure , runny nose, allergies.  Review Past Medical/Family/Social: Per EMR    Risk Factors  Current exercise habits: Walks daily Dietary issues discussed: None  Cardiac risk factors: Previous smoker  Depression Screen  (Note: if answer to either of the following is "Yes", a more complete depression screening is indicated)  Over the past two weeks, have you felt down, depressed or hopeless? Yes Over the past two weeks, have you felt little interest or pleasure in doing things? No Have you lost interest or pleasure in daily life? No Do you often feel hopeless? No Do you cry easily over simple problems? No   Activities of Daily Living  In your present state of health, do you have any difficulty performing the following activities?:  Driving? No  Managing money? No  Feeding yourself? No  Getting from bed to chair? No  Climbing a flight of stairs? No  Preparing food and eating?: No  Bathing or showering? No  Getting dressed: No  Getting to the toilet? No  Using the toilet:No  Moving around from place to place: No  In the past year have you fallen or had a near fall?:Yes Are you sexually active? Yes Do you have more than one partner? No   Hearing Difficulties: No  Do you often ask people to speak up or repeat themselves? No  Do you experience ringing or noises in your ears? No Do you have difficulty understanding soft or whispered voices? No  Do you feel that you have a problem with memory? No Do  you often misplace items? No  Do you feel safe at home? Yes  Cognitive Testing  Alert? Yes Normal Appearance?Yes  Oriented to person? Yes Place? Yes  Time? Yes  Recall of three objects? Yes  Can perform simple calculations? Yes  Displays appropriate judgment?Yes  Can read the correct time from a watch face?Yes   List the Names of Other Physician/Practitioners you currently use: Dr. Romona Curls    Screening Tests / Date Colonoscopy   - Age 2           Zostavax - Age 7 Tetanus/tdap-- not covered by insurance  ROS: GEN- denies fatigue, fever, weight loss,weakness, recent illness HEENT- denies eye drainage, change in vision, nasal discharge, CVS- denies chest pain, palpitations RESP- denies SOB, cough, wheeze ABD- denies N/V, change in stools, abd pain GU- denies dysuria, hematuria, dribbling, incontinence MSK- denies joint pain, muscle aches, injury Neuro- denies headache, dizziness, syncope, seizure activity  CPE:  GEN- NAD, alert and oriented x3 HEENT- PERRL, EOMI, non injected sclera, pink conjunctiva, MMM, oropharynx clear, TM Clear bilat, nares clear, no maxillary sinusitis Neck- Supple, no bruit CVS- RRR, no murmur RESP-CTAB ABD- NABS,soft,NT,ND Neuro- CNII-XII in tact, no deficits GU- external hemorrhoids, FOBT positive, prostate smooth, NT, brown stool in valut, no gross blood EXT- No edema Pulses- Radial 2+ Psych- normal affect and mood    Assessment:    Annual wellness medicare exam   Plan:  During the course of the visit the patient was educated and counseled about appropriate screening and preventive services including:  UTD as covered by insurance   Colonoscopy due to blood in stool  Screen + for depression. Mild depression on xanax  Diet review for nutrition referral? Yes ____ Not Indicated __x__  Patient Instructions (the written plan) was given to the patient.  Medicare Attestation  I have personally reviewed:  The patient's medical and  social history  Their use of alcohol, tobacco or illicit drugs  Their current medications and supplements  The patient's functional ability including ADLs,fall risks, home safety risks, cognitive, and hearing and visual impairment  Diet and physical activities  Evidence for depression or mood disorders  The patient's weight, height, BMI, and visual acuity have been recorded in the chart. I have made referrals, counseling, and provided education to the patient based on review of the above and I have provided the patient with a written personalized care plan for preventive services.

## 2013-04-24 NOTE — Patient Instructions (Signed)
Referral to GI for blood in stool Continue current medications Pick up the anusol Start the allergy medication F/U 4 months

## 2013-04-25 LAB — HEMOGLOBIN A1C
Hgb A1c MFr Bld: 5.8 % — ABNORMAL HIGH (ref <5.7)
MEAN PLASMA GLUCOSE: 120 mg/dL — AB (ref <117)

## 2013-04-25 LAB — PSA, MEDICARE: PSA: 1.6 ng/mL (ref <=4.00)

## 2013-04-26 ENCOUNTER — Encounter: Payer: Self-pay | Admitting: *Deleted

## 2013-06-10 ENCOUNTER — Emergency Department (HOSPITAL_COMMUNITY): Payer: PRIVATE HEALTH INSURANCE

## 2013-06-10 ENCOUNTER — Emergency Department (HOSPITAL_COMMUNITY)
Admission: EM | Admit: 2013-06-10 | Discharge: 2013-06-10 | Disposition: A | Discharge status: Home / Self Care | Payer: PRIVATE HEALTH INSURANCE | Attending: Emergency Medicine | Admitting: Emergency Medicine

## 2013-06-10 ENCOUNTER — Encounter (HOSPITAL_COMMUNITY): Payer: Self-pay | Admitting: Emergency Medicine

## 2013-06-10 DIAGNOSIS — Z8719 Personal history of other diseases of the digestive system: Secondary | ICD-10-CM | POA: Insufficient documentation

## 2013-06-10 DIAGNOSIS — Z87891 Personal history of nicotine dependence: Secondary | ICD-10-CM | POA: Insufficient documentation

## 2013-06-10 DIAGNOSIS — Z7982 Long term (current) use of aspirin: Secondary | ICD-10-CM | POA: Insufficient documentation

## 2013-06-10 DIAGNOSIS — Z79899 Other long term (current) drug therapy: Secondary | ICD-10-CM | POA: Insufficient documentation

## 2013-06-10 DIAGNOSIS — R0609 Other forms of dyspnea: Secondary | ICD-10-CM | POA: Insufficient documentation

## 2013-06-10 DIAGNOSIS — R0600 Dyspnea, unspecified: Secondary | ICD-10-CM

## 2013-06-10 DIAGNOSIS — M129 Arthropathy, unspecified: Secondary | ICD-10-CM | POA: Insufficient documentation

## 2013-06-10 DIAGNOSIS — F411 Generalized anxiety disorder: Secondary | ICD-10-CM | POA: Insufficient documentation

## 2013-06-10 DIAGNOSIS — J019 Acute sinusitis, unspecified: Secondary | ICD-10-CM | POA: Insufficient documentation

## 2013-06-10 DIAGNOSIS — R0989 Other specified symptoms and signs involving the circulatory and respiratory systems: Principal | ICD-10-CM | POA: Insufficient documentation

## 2013-06-10 LAB — CBC WITH DIFFERENTIAL/PLATELET
BASOS ABS: 0 10*3/uL (ref 0.0–0.1)
Basophils Relative: 0 % (ref 0–1)
Eosinophils Absolute: 0.1 10*3/uL (ref 0.0–0.7)
Eosinophils Relative: 1 % (ref 0–5)
HCT: 40.8 % (ref 39.0–52.0)
HEMOGLOBIN: 14.1 g/dL (ref 13.0–17.0)
LYMPHS ABS: 1.7 10*3/uL (ref 0.7–4.0)
LYMPHS PCT: 29 % (ref 12–46)
MCH: 29.6 pg (ref 26.0–34.0)
MCHC: 34.6 g/dL (ref 30.0–36.0)
MCV: 85.7 fL (ref 78.0–100.0)
Monocytes Absolute: 0.3 10*3/uL (ref 0.1–1.0)
Monocytes Relative: 6 % (ref 3–12)
NEUTROS ABS: 3.7 10*3/uL (ref 1.7–7.7)
Neutrophils Relative %: 64 % (ref 43–77)
Platelets: 158 10*3/uL (ref 150–400)
RBC: 4.76 MIL/uL (ref 4.22–5.81)
RDW: 12.8 % (ref 11.5–15.5)
WBC: 5.7 10*3/uL (ref 4.0–10.5)

## 2013-06-10 LAB — URINALYSIS, ROUTINE W REFLEX MICROSCOPIC
Bilirubin Urine: NEGATIVE
GLUCOSE, UA: NEGATIVE mg/dL
Hgb urine dipstick: NEGATIVE
Ketones, ur: NEGATIVE mg/dL
LEUKOCYTES UA: NEGATIVE
Nitrite: NEGATIVE
PH: 5.5 (ref 5.0–8.0)
Protein, ur: NEGATIVE mg/dL
Specific Gravity, Urine: >1.03 — ABNORMAL HIGH (ref 1.005–1.030)
Urobilinogen, UA: 0.2 mg/dL (ref 0.0–1.0)

## 2013-06-10 LAB — BASIC METABOLIC PANEL
BUN: 13 mg/dL (ref 6–23)
CO2: 28 meq/L (ref 19–32)
CREATININE: 1.07 mg/dL (ref 0.50–1.35)
Calcium: 8.6 mg/dL (ref 8.4–10.5)
Chloride: 99 mEq/L (ref 96–112)
GFR calc Af Amer: >90 mL/min (ref >90)
GFR calc non Af Amer: 80 mL/min — ABNORMAL LOW (ref >90)
GLUCOSE: 118 mg/dL — AB (ref 70–99)
Potassium: 3.8 mEq/L (ref 3.7–5.3)
Sodium: 139 mEq/L (ref 137–147)

## 2013-06-10 LAB — I-STAT TROPONIN, ED
Troponin i, poc: 0.01 ng/mL (ref 0.00–0.08)
Troponin i, poc: 0 ng/mL (ref 0.00–0.08)

## 2013-06-10 MED ORDER — ASPIRIN EC 325 MG PO TBEC: 325.0000 mg | DELAYED_RELEASE_TABLET | Freq: Every day | ORAL | Status: DC

## 2013-06-10 MED ORDER — ASPIRIN 325 MG PO TABS
325.0000 mg | ORAL_TABLET | Freq: Once | ORAL | Status: AC
  Administered 2013-06-10: 325 mg via ORAL
  Filled 2013-06-10: qty 1

## 2013-06-10 NOTE — Discharge Instructions (Signed)
Your caregiver has diagnosed you as having chest pain and shortness of breath that is not specific for one problem.  You are at low risk for an acute heart condition or other serious illness. Chest pain comes from many different causes.  SEEK IMMEDIATE MEDICAL ATTENTION IF: You have severe chest pain, especially if the pain is crushing or pressure-like and spreads to the arms, back, neck, or jaw, or if you have sweating, nausea (feeling sick to your stomach), or shortness of breath. THIS IS AN EMERGENCY. Don't wait to see if the pain will go away. Get medical help at once. Call 911 or 0 (operator). DO NOT drive yourself to the hospital.  Your chest pain gets worse and does not go away with rest.  You have an attack of chest pain lasting longer than usual, despite rest and treatment with the medications your caregiver has prescribed.  You wake from sleep with chest pain or shortness of breath.  You feel dizzy or faint.  You have chest pain not typical of your usual pain for which you originally saw your caregiver.

## 2013-06-10 NOTE — ED Notes (Signed)
Pt states he became SOB while walking uptown ~ 30 min PTA. NAD on arrival to room.

## 2013-06-10 NOTE — ED Notes (Signed)
Pt is sitting in the chair, pt states he is better and just waiting on the last blood test.

## 2013-06-10 NOTE — ED Notes (Signed)
Pt states started having SOB while walking to store; pt with tingling feeling to right flank area

## 2013-06-10 NOTE — ED Provider Notes (Signed)
CSN: [id number]     Arrival date & time 06/10/13  1434 History   First MD Initiated Contact with Patient 06/10/13 1507     Chief Complaint  Patient presents with  . Shortness of Breath     (Consider location/radiation/quality/duration/timing/severity/associated sxs/prior Treatment) HPI 48 year old male with history of anxiety, chronic neck pain, chronic back pain, chronic intermittent tingling to his low back several times per month for the last several months if not years without pain weakness numbness to his legs or change in bowel or bladder function; presents with a spell that is now resolved today of slight shortness of breath while walking that lasted less than 30-45 minutes total and was able to continue walking during his episode and had 2 episodes lasting less than 3 seconds each of slight twinge of chest discomfort that was not sharp or stabbing more severe, he is no fever no cough no trauma no localization no history of prior episodes of shortness of breath to his recollection, he thinks he might have just had an anxiety spell but is not sure, he feels asymptomatic now except for his baseline chronic neck and back pain. His episode occurred about an hour prior to arrival.  Past Medical History  Diagnosis Date  . Arthritis   . Anxiety   . Sinusitis   . Back pain   . Dental caries    Past Surgical History  Procedure Laterality Date  . Vasectomy    . Tooth extraction    . Inguinal hernia repair Right 10/01/2012    Procedure: HERNIA REPAIR INGUINAL ADULT;  Surgeon: Scherry Ran, MD;  Location: [facility];  Service: General;  Laterality: Right;   Family History  Problem Relation Age of Onset  . Heart disease Father    History  Substance Use Topics  . Smoking status: Former Research scientist (life sciences)  . Smokeless tobacco: Current User    Types: Snuff  . Alcohol Use: No    Review of Systems  10 Systems reviewed and are negative for acute change except as noted in the HPI.  Allergies   Tramadol  Home Medications   Prior to Admission medications   Medication Sig Start Date End Date Taking? Authorizing Provider  acetaminophen (TYLENOL) 500 MG tablet Take 250 mg by mouth daily as needed for moderate pain.   Yes Historical Provider, MD  ALPRAZolam Duanne Moron) 1 MG tablet Take 1 tablet (1 mg total) by mouth at bedtime as needed for sleep. 04/24/13  Yes Alycia Rossetti, MD  diphenhydrAMINE (BENADRYL) 12.5 MG/5ML liquid Take 25 mg by mouth daily as needed for allergies.   Yes Historical Provider, MD  aspirin EC 325 MG tablet Take 1 tablet (325 mg total) by mouth daily. 06/10/13   Babette Relic, MD   BP 126/91  Pulse 81  Temp(Src) 97.8 F (36.6 C) (Oral)  Resp 16  Ht 6\' 2"  (1.88 m)  Wt 170 lb (77.111 kg)  BMI 21.82 kg/m2  SpO2 100% Physical Exam  Nursing note and vitals reviewed. Constitutional:  Awake, alert, nontoxic appearance.  HENT:  Head: Atraumatic.  Eyes: Right eye exhibits no discharge. Left eye exhibits no discharge.  Neck: Neck supple.  Cardiovascular: Normal rate and regular rhythm.   No murmur heard. Pulmonary/Chest: Effort normal and breath sounds normal. No respiratory distress. He has no wheezes. He has no rales. He exhibits no tenderness.  Abdominal: Soft. Bowel sounds are normal. He exhibits no distension. There is no tenderness. There is no rebound and no guarding.  Musculoskeletal: He exhibits no edema and no tenderness.  Baseline ROM, no obvious new focal weakness.  Neurological: He is alert.  Mental status and motor strength appears baseline for patient and situation.  Skin: No rash noted.  Psychiatric: He has a normal mood and affect.    ED Course  Procedures (including critical care time) HEART score 1 Wells PE low risk PERC negative Patient / Family / Caregiver understand and agree with initial ED impression and plan with expectations set for ED visit.  Labs Review Labs Reviewed  BASIC METABOLIC PANEL - Abnormal; Notable for the  following:    Glucose, Bld 118 (*)    GFR calc non Af Amer 80 (*)    All other components within normal limits  URINALYSIS, ROUTINE W REFLEX MICROSCOPIC - Abnormal; Notable for the following:    Specific Gravity, Urine >1.030 (*)    All other components within normal limits  CBC WITH DIFFERENTIAL  I-STAT TROPOININ, ED  I-STAT TROPOININ, ED    Imaging Review No results found.   EKG Interpretation   Date/Time:  Monday June 10 2013 14:49:15 EDT Ventricular Rate:  86 PR Interval:  155 QRS Duration: 89 QT Interval:  338 QTC Calculation: 404 R Axis:   42 Text Interpretation:  Sinus rhythm RSR' in V1 or V2, right VCD or RVH No  previous tracing Confirmed by KNAPP  MD-J, JON ([id number]) on 06/10/2013 2:56:32  PM      MDM   Final diagnoses:  Dyspnea    I doubt any other EMC precluding discharge at this time including, but not necessarily limited to the following:PE, AMI.    Babette Relic, MD 06/13/13 [phone]

## 2013-06-19 ENCOUNTER — Ambulatory Visit: Payer: Medicare Other | Admitting: Family Medicine

## 2013-06-26 ENCOUNTER — Ambulatory Visit (INDEPENDENT_AMBULATORY_CARE_PROVIDER_SITE_OTHER): Payer: Medicare Other | Admitting: Family Medicine

## 2013-06-26 ENCOUNTER — Encounter: Payer: Self-pay | Admitting: Family Medicine

## 2013-06-26 VITALS — BP 128/80 | HR 64 | Temp 97.9°F | Resp 12 | Ht 70.0 in | Wt 171.0 lb

## 2013-06-26 DIAGNOSIS — G4700 Insomnia, unspecified: Secondary | ICD-10-CM

## 2013-06-26 DIAGNOSIS — F419 Anxiety disorder, unspecified: Secondary | ICD-10-CM

## 2013-06-26 DIAGNOSIS — H9319 Tinnitus, unspecified ear: Secondary | ICD-10-CM | POA: Insufficient documentation

## 2013-06-26 DIAGNOSIS — F411 Generalized anxiety disorder: Secondary | ICD-10-CM

## 2013-06-26 DIAGNOSIS — L600 Ingrowing nail: Secondary | ICD-10-CM

## 2013-06-26 MED ORDER — DIAZEPAM 5 MG PO TABS: 5.0000 mg | ORAL_TABLET | Freq: Every evening | ORAL | Status: DC | PRN

## 2013-06-26 MED ORDER — CEPHALEXIN 500 MG PO CAPS: 500.0000 mg | ORAL_CAPSULE | Freq: Two times a day (BID) | ORAL | Status: DC

## 2013-06-26 NOTE — Patient Instructions (Addendum)
Refer to ENT for ringing in ears  Epson salt soaks  Switch to valium at bedtime  Take the antibiotics for your foot F/U as previous

## 2013-06-29 DIAGNOSIS — L600 Ingrowing nail: Secondary | ICD-10-CM | POA: Insufficient documentation

## 2013-06-29 DIAGNOSIS — G4700 Insomnia, unspecified: Secondary | ICD-10-CM | POA: Insufficient documentation

## 2013-06-29 NOTE — Assessment & Plan Note (Signed)
Start valium at bedtime, discussed sleep hygiene

## 2013-06-29 NOTE — Assessment & Plan Note (Signed)
He has an ingrown nail but there is some sign of a superficial infection as well. I'll have him use Epson salt soaks I will also go ahead and cover her with antibiotics. He states that he will schedule with his podiatrist

## 2013-06-29 NOTE — Assessment & Plan Note (Signed)
Changed to Valium at bedtime see if this also helps with his insomnia

## 2013-06-29 NOTE — Assessment & Plan Note (Signed)
Refer to ear nose and throat for treatment

## 2013-06-29 NOTE — Progress Notes (Signed)
Patient ID: ANDERSEN IORIO, male   DOB: [birth year], 48 y.o.   MRN: [id number]   Subjective:    Patient ID: FERMON URETA, male    DOB: [birth year], 48 y.o.   MRN: [id number]  Patient presents for Dizziness/ loss of balance  patient here to followup emergency room visit for dizziness shortness of breath and chest discomfort. This evaluation was negative chest x-ray was normal EKG was normal. His symptoms then resolved however he still gets ringing in his ear is mostly on the left side it feels like his hearing is changing. He also asked about his anxiety medication. He has not been sleeping as well and does not think that the medication is working as well as it used to. He was on Valium in the past and thought that this worked better does want to know if he could try something different.  Toe pain. He's noticed some redness around his toe states that he has some mild discomfort in the toe as well. He does have a rare particular injury. He's been unable to see podiatry secondary to finances.    Review Of Systems:  GEN- denies fatigue, fever, weight loss,weakness, recent illness HEENT- denies eye drainage, change in vision, nasal discharge, CVS- denies chest pain, palpitations RESP- denies SOB, cough, wheeze ABD- denies N/V, change in stools, abd pain GU- denies dysuria, hematuria, dribbling, incontinence MSK- denies joint pain, muscle aches, injury Neuro- denies headache, +dizziness, syncope, seizure activity       Objective:    BP 128/80  Pulse 64  Temp(Src) 97.9 F (36.6 C) (Oral)  Resp 12  Ht 5\' 10"  (1.778 m)  Wt 171 lb (77.565 kg)  BMI 24.54 kg/m2 GEN- NAD, alert and oriented x3 HEENT- PERRL, EOMI, non injected sclera, pink conjunctiva, MMM, oropharynx clear TM clear bilat no effusion, able to hear whispher no maxillary sinus tenderness, nares clear   Neck- Supple, no LAD CVS- RRR, no murmur RESP-CTAB Neuro - CNII-XII in tact, no deficits EXT- No edema Skin- mild ingrown nail  with erythema and mild swelling lateral edge 3rd digit left foot, no ulceration Pulses- Radial 2+, DP         Assessment & Plan:      Problem List Items Addressed This Visit   Tinnitus - Primary      Note: This dictation was prepared with Dragon dictation along with smaller phrase technology. Any transcriptional errors that result from this process are unintentional.

## 2013-07-10 ENCOUNTER — Telehealth: Payer: Self-pay | Admitting: *Deleted

## 2013-07-10 NOTE — Telephone Encounter (Signed)
Message copied by Sheral Flow on Wed Jul 10, 2013  2:26 PM ------      Message from: Devoria Glassing      Created: Wed Jul 10, 2013 12:19 PM       diazepam (VALIUM) 5 MG tablet      He claims his pharmacy told him to call us and see if we could approve his diazepam to be increased   ------

## 2013-07-10 NOTE — Telephone Encounter (Signed)
Returned call to patient. Reports that he needs refill on Diazepam, but pharmacy states it cannot be filled at this time.   Last refill 06/26/2013. States that with increased stress in personal life, he has been taking more than the prescribed (1) tab at bedtime as needed. Reports that he has been taking on average (2) tabs a day.   Requested to have medication increased. Advised that he would need to be seen by MD before increasing medication. Attempted to schedule appointment, but patient was unsure of when he could arrange transportation.   MD to be made aware.

## 2013-07-25 ENCOUNTER — Ambulatory Visit (INDEPENDENT_AMBULATORY_CARE_PROVIDER_SITE_OTHER): Payer: PRIVATE HEALTH INSURANCE | Admitting: Otolaryngology

## 2013-07-25 DIAGNOSIS — H9319 Tinnitus, unspecified ear: Secondary | ICD-10-CM

## 2013-07-25 DIAGNOSIS — H903 Sensorineural hearing loss, bilateral: Secondary | ICD-10-CM

## 2013-07-25 DIAGNOSIS — R42 Dizziness and giddiness: Secondary | ICD-10-CM

## 2013-08-22 ENCOUNTER — Encounter: Payer: Self-pay | Admitting: Family Medicine

## 2013-08-22 ENCOUNTER — Ambulatory Visit (INDEPENDENT_AMBULATORY_CARE_PROVIDER_SITE_OTHER): Payer: Medicare Other | Admitting: Family Medicine

## 2013-08-22 VITALS — BP 108/62 | HR 68 | Temp 98.1°F | Resp 14 | Ht 69.0 in | Wt 178.0 lb

## 2013-08-22 DIAGNOSIS — F411 Generalized anxiety disorder: Secondary | ICD-10-CM

## 2013-08-22 DIAGNOSIS — L600 Ingrowing nail: Secondary | ICD-10-CM

## 2013-08-22 DIAGNOSIS — F419 Anxiety disorder, unspecified: Secondary | ICD-10-CM

## 2013-08-22 MED ORDER — DIAZEPAM 5 MG PO TABS: 5.0000 mg | ORAL_TABLET | Freq: Two times a day (BID) | ORAL | Status: DC | PRN

## 2013-08-22 NOTE — Progress Notes (Signed)
Patient ID: Jacob Reed, male   DOB: [birth year], 48 y.o.   MRN: [id number]   Subjective:    Patient ID: Jacob Reed, male    DOB: 06/28/1965, 48 y.o.   MRN: [id number]  Patient presents for 4 month F/U  Pt here to f/u anxiety and ingrown toenail which is now resolved, he also saw podiatry for nail trim as well at last visit he was sent to ENT no significant hearing loss was found, anxiety was a big component, he is doing better with valium but did have to take 2 a couple days last month, as he received noticed his ex-wife was back on drugs   Review Of Systems:  GEN- denies fatigue, fever, weight loss,weakness, recent illness HEENT- denies eye drainage, change in vision, nasal discharge, CVS- denies chest pain, palpitations RESP- denies SOB, cough, wheeze MSK- denies joint pain, muscle aches, injury Neuro- denies headache, dizziness, syncope, seizure activity       Objective:    BP 108/62  Pulse 68  Temp(Src) 98.1 F (36.7 C) (Oral)  Resp 14  Ht 5\' 9"  (1.753 m)  Wt 178 lb (80.74 kg)  BMI 26.27 kg/m2 GEN- NAD, alert and oriented x3 Psych- normal affect and mood  EXT- Left foot- no ingrown nails, no erythema Pulses-  DP- 2+        Assessment & Plan:      Problem List Items Addressed This Visit   Anxiety - Primary      Note: This dictation was prepared with Dragon dictation along with smaller phrase technology. Any transcriptional errors that result from this process are unintentional.

## 2013-08-22 NOTE — Patient Instructions (Addendum)
Continue current medications  Okay to take vitamins  F/U 6 months

## 2013-08-22 NOTE — Assessment & Plan Note (Signed)
Resolved

## 2013-08-22 NOTE — Assessment & Plan Note (Signed)
Continue valium, new scipt given 45 tablets

## 2013-08-23 ENCOUNTER — Ambulatory Visit: Payer: Medicare Other | Admitting: Family Medicine

## 2013-11-04 ENCOUNTER — Ambulatory Visit (INDEPENDENT_AMBULATORY_CARE_PROVIDER_SITE_OTHER): Payer: Medicare Other | Admitting: Family Medicine

## 2013-11-04 ENCOUNTER — Encounter: Payer: Self-pay | Admitting: Family Medicine

## 2013-11-04 VITALS — BP 118/78 | HR 68 | Temp 98.1°F | Resp 16 | Ht 69.0 in | Wt 179.0 lb

## 2013-11-04 DIAGNOSIS — G4700 Insomnia, unspecified: Secondary | ICD-10-CM

## 2013-11-04 DIAGNOSIS — F419 Anxiety disorder, unspecified: Secondary | ICD-10-CM

## 2013-11-04 MED ORDER — ESCITALOPRAM OXALATE 10 MG PO TABS: 10.0000 mg | ORAL_TABLET | Freq: Every day | ORAL | Status: DC

## 2013-11-04 NOTE — Patient Instructions (Signed)
Start the lexapro at bedtime Continue Valium at current dose F/U 3 months

## 2013-11-05 NOTE — Assessment & Plan Note (Signed)
Anxiety related to multiple of his somatic complaints including his dizzy spells after evaluation. We'll start him on Lexapro 10 mg Will continue the Valium 5 mg

## 2013-11-05 NOTE — Progress Notes (Signed)
Patient ID: Jacob Reed, male   DOB: 07-06-1965, 48 y.o.   MRN: [id number]   Subjective:    Patient ID: Jacob Reed, male    DOB: 02/01/1965, 48 y.o.   MRN: [id number]  Patient presents for 4 month F/U and Dizzy Spells  patient here to follow chronic medical problems. He was seen by ear nose and throat secondary to concern for hearing loss and dizzy spells without this is most likely anxiety worsen his symptoms. He has been under a lot of stress recently as his wife was incarcerated again and he still having difficulty seeing his son. He continues to have difficulty with sleep but is still taking his Valium as is the only thing that helps with his nerves.    Review Of Systems:  GEN- denies fatigue, fever, weight loss,weakness, recent illness HEENT- denies eye drainage, change in vision, nasal discharge, CVS- denies chest pain, palpitations RESP- denies SOB, cough, wheeze ABD- denies N/V, change in stools, abd pain GU- denies dysuria, hematuria, dribbling, incontinence MSK- denies joint pain, muscle aches, injury Neuro- denies headache, dizziness, syncope, seizure activity       Objective:    BP 118/78  Pulse 68  Temp(Src) 98.1 F (36.7 C) (Oral)  Resp 16  Ht 5\' 9"  (1.753 m)  Wt 179 lb (81.194 kg)  BMI 26.42 kg/m2 GEN- NAD, alert and oriented x3 HEENT- PERRL, EOMI, non injected sclera, pink conjunctiva, MMM, oropharynx clear CVS- RRR, no murmur RESP-CTAB Psych- normal affect and mood, no SI, well groomed EXT- No edema         Assessment & Plan:      Problem List Items Addressed This Visit   Anxiety - Primary   Relevant Medications      escitalopram (LEXAPRO) tablet      Note: This dictation was prepared with Dragon dictation along with smaller phrase technology. Any transcriptional errors that result from this process are unintentional.

## 2013-11-09 ENCOUNTER — Other Ambulatory Visit: Payer: Self-pay | Admitting: Family Medicine

## 2013-11-11 ENCOUNTER — Telehealth: Payer: Self-pay | Admitting: Family Medicine

## 2013-11-11 NOTE — Telephone Encounter (Signed)
Request is being processed in another patient request note.

## 2013-11-11 NOTE — Telephone Encounter (Signed)
Medication called to pharmacy. 

## 2013-11-11 NOTE — Telephone Encounter (Signed)
Ok to refill??  Last office visit 11/04/2013.  Last refill 08/22/2013, #2 refills.

## 2013-11-11 NOTE — Telephone Encounter (Signed)
Okay to refill? 

## 2013-11-11 NOTE — Telephone Encounter (Signed)
PT is needing a refill on his nerve medication  [facility]

## 2014-01-11 ENCOUNTER — Other Ambulatory Visit: Payer: Self-pay | Admitting: Family Medicine

## 2014-01-18 ENCOUNTER — Telehealth: Payer: Self-pay | Admitting: Family Medicine

## 2014-01-18 NOTE — Telephone Encounter (Signed)
Received page from operator to call this patient back. I called patient and discovered that his doctor is from [place] and is not part of the [facility] residency program practice. I advised the patient that he would need to call the on-call doctor for his physicians practice. He stated he did not know how to accomplish this. I advised that I would speak to the operator to figure out how best to accomplish this. I called the operator and she stated that she would call Forestine Na to get in touch with the on-call doctor for this patient. The operator also advised that they would get back in touch with the patient to ensure that he gets in touch with his physician. I called the patient back to inform him of this and to advise him to call the physicians office in [facility] to see if there is an answering service.

## 2014-01-20 ENCOUNTER — Telehealth: Payer: Self-pay | Admitting: Family Medicine

## 2014-01-20 MED ORDER — ESCITALOPRAM OXALATE 10 MG PO TABS: 10.0000 mg | ORAL_TABLET | Freq: Every day | ORAL | Status: DC

## 2014-01-20 MED ORDER — DIAZEPAM 5 MG PO TABS: ORAL_TABLET | ORAL | Status: DC

## 2014-01-20 NOTE — Telephone Encounter (Signed)
Pt called this weekend , he spoke with Olean Ree who was on-call. He was under a lot of stress at the time because his ex-wife seem to be in a [place] mental breakdown. She was finally arrested by the cops about an hour so after he called she was being a disturbance to him as well as the neighborhood. He been taking Valium twice a day as needed but it was not helping. He has not been taking his Lexapro. I spoke with him today we'll restart the Lexapro 10 mg he will continue the Valium he needs both of them together to help with his anxiety and his nerves.

## 2014-02-10 ENCOUNTER — Telehealth: Payer: Self-pay | Admitting: Family Medicine

## 2014-02-10 NOTE — Telephone Encounter (Signed)
Medication called to pharmacy on 01/21/2014 with #3 refills.   Patient has refills on prescription on file at pharmacy.   Call placed to patient to make aware.

## 2014-02-10 NOTE — Telephone Encounter (Signed)
[phone]  PT is needing a refill on his diazepam (VALIUM) 5 MG tablet

## 2014-02-13 ENCOUNTER — Encounter: Payer: Self-pay | Admitting: Physician Assistant

## 2014-02-13 ENCOUNTER — Ambulatory Visit (INDEPENDENT_AMBULATORY_CARE_PROVIDER_SITE_OTHER): Payer: Medicare Other | Admitting: Physician Assistant

## 2014-02-13 VITALS — BP 110/76 | HR 80 | Temp 98.1°F | Resp 18 | Wt 186.0 lb

## 2014-02-13 DIAGNOSIS — G4700 Insomnia, unspecified: Secondary | ICD-10-CM | POA: Diagnosis not present

## 2014-02-13 DIAGNOSIS — F419 Anxiety disorder, unspecified: Secondary | ICD-10-CM

## 2014-02-13 NOTE — Progress Notes (Signed)
    Patient ID: Jacob Reed MRN: [id number], DOB: January 22, 1965, 49 y.o. Date of Encounter: 02/13/2014, 12:54 PM    Chief Complaint:  Chief Complaint  Patient presents with  . routine check up    c/o back hurting     HPI: 49 y.o. year old white male here for routine follow-up.  Makes mention that his wife is in jail until March 1. Also makes mentions of problems regarding his son.  Says that he takes the Valium 1 each night to sleep. Is that he uses a Lexapro as needed--- "like when he gets a phone call regarding his wife in jail."  Or  " if the phone rings and had something about my son".  At the end of the visit,  he asked-- why his Vicodin was stopped for his back pain. This is all he says about it. Doesn't really say anything about his back hurting but just asked why his Vicodin was stopped.     Home Meds:   Outpatient Prescriptions Prior to Visit  Medication Sig Dispense Refill  . acetaminophen (TYLENOL) 500 MG tablet Take 250 mg by mouth daily as needed for moderate pain.    Marland Kitchen aspirin EC 325 MG tablet Take 1 tablet (325 mg total) by mouth daily. 30 tablet 0  . diazepam (VALIUM) 5 MG tablet TAKE 1 TABLET BY MOUTH 2 TIMES DAILY AS NEEDED FOR ANXIETY. 45 tablet 2  . diphenhydrAMINE (BENADRYL) 12.5 MG/5ML liquid Take 25 mg by mouth daily as needed for allergies.    Marland Kitchen escitalopram (LEXAPRO) 10 MG tablet Take 1 tablet (10 mg total) by mouth at bedtime. For mood 30 tablet 3   No facility-administered medications prior to visit.    Allergies:  Allergies  Allergen Reactions  . Tramadol Hives      Review of Systems: See HPI for pertinent ROS. All other ROS negative.    Physical Exam: Blood pressure 110/76, pulse 80, temperature 98.1 F (36.7 C), temperature source Oral, resp. rate 18, weight 186 lb (84.369 kg)., Body mass index is 27.45 kg/(m^2). General: WNWD WM.  Appears in no acute distress. Neck: Supple. No thyromegaly. No lymphadenopathy. Lungs: Clear bilaterally  to auscultation without wheezes, rales, or rhonchi. Breathing is unlabored. Heart: Regular rhythm. No murmurs, rubs, or gallops. Msk:  Strength and tone normal for age. Extremities/Skin: Warm and dry.  Neuro: Alert and oriented X 3. Moves all extremities spontaneously. Gait is normal. CNII-XII grossly in tact. Psych: Sits quietly and doesn't say very much.  Somewhat flattened affect.      ASSESSMENT AND PLAN:  49 y.o. year old male with  1. Anxiety  2. Insomnia  Discussed proper use of his medications. Discussed that the Lexapro does not work on an as-needed basis and it has to be taken daily. Discussed that if he would take the Lexapro daily that it would hopefully decrease his anxiety level.   [address] [place], [place], [facility] 02/13/2014 12:54 PM

## 2014-02-20 ENCOUNTER — Ambulatory Visit: Payer: Medicare Other | Admitting: Physician Assistant

## 2014-05-09 ENCOUNTER — Other Ambulatory Visit: Payer: Self-pay | Admitting: Family Medicine

## 2014-05-09 NOTE — Telephone Encounter (Signed)
Okay to refill? 

## 2014-05-09 NOTE — Telephone Encounter (Signed)
Ok to refill??  Last office visit 02/13/2014.  Last refill 01/20/2014, #2 refills.

## 2014-05-09 NOTE — Telephone Encounter (Signed)
rx called in

## 2014-05-28 ENCOUNTER — Emergency Department (HOSPITAL_COMMUNITY)
Admission: EM | Admit: 2014-05-28 | Discharge: 2014-05-28 | Disposition: A | Discharge status: Home / Self Care | Payer: Medicare Other | Attending: Emergency Medicine | Admitting: Emergency Medicine

## 2014-05-28 ENCOUNTER — Ambulatory Visit: Payer: Medicare Other | Admitting: Family Medicine

## 2014-05-28 ENCOUNTER — Emergency Department (HOSPITAL_COMMUNITY): Payer: Medicare Other

## 2014-05-28 ENCOUNTER — Encounter (HOSPITAL_COMMUNITY): Payer: Self-pay | Admitting: *Deleted

## 2014-05-28 DIAGNOSIS — R062 Wheezing: Secondary | ICD-10-CM | POA: Diagnosis not present

## 2014-05-28 DIAGNOSIS — R05 Cough: Secondary | ICD-10-CM | POA: Insufficient documentation

## 2014-05-28 DIAGNOSIS — M1990 Unspecified osteoarthritis, unspecified site: Secondary | ICD-10-CM | POA: Diagnosis not present

## 2014-05-28 DIAGNOSIS — Z7982 Long term (current) use of aspirin: Secondary | ICD-10-CM | POA: Diagnosis not present

## 2014-05-28 DIAGNOSIS — R0981 Nasal congestion: Secondary | ICD-10-CM | POA: Diagnosis not present

## 2014-05-28 DIAGNOSIS — Z8719 Personal history of other diseases of the digestive system: Secondary | ICD-10-CM | POA: Diagnosis not present

## 2014-05-28 DIAGNOSIS — R059 Cough, unspecified: Secondary | ICD-10-CM

## 2014-05-28 DIAGNOSIS — Z8709 Personal history of other diseases of the respiratory system: Secondary | ICD-10-CM | POA: Diagnosis not present

## 2014-05-28 DIAGNOSIS — F419 Anxiety disorder, unspecified: Secondary | ICD-10-CM | POA: Diagnosis not present

## 2014-05-28 DIAGNOSIS — R079 Chest pain, unspecified: Secondary | ICD-10-CM | POA: Insufficient documentation

## 2014-05-28 DIAGNOSIS — Z87891 Personal history of nicotine dependence: Secondary | ICD-10-CM | POA: Insufficient documentation

## 2014-05-28 MED ORDER — ALBUTEROL SULFATE HFA 108 (90 BASE) MCG/ACT IN AERS
1.0000 | INHALATION_SPRAY | RESPIRATORY_TRACT | Status: DC | PRN
  Administered 2014-05-28: 2 via RESPIRATORY_TRACT
  Filled 2014-05-28: qty 6.7

## 2014-05-28 MED ORDER — AZITHROMYCIN 250 MG PO TABS: 250.0000 mg | ORAL_TABLET | Freq: Every day | ORAL | Status: DC

## 2014-05-28 MED ORDER — GUAIFENESIN-CODEINE 100-10 MG/5ML PO SYRP: 10.0000 mL | ORAL_SOLUTION | Freq: Three times a day (TID) | ORAL | Status: DC | PRN

## 2014-05-28 NOTE — Discharge Instructions (Signed)
Cough, Adult   A cough is a reflex. It helps you clear your throat and airways. A cough can help heal your body. A cough can last 2 or 3 weeks (acute) or may last more than 8 weeks (chronic). Some common causes of a cough can include an infection, allergy, or a cold.  HOME CARE  · Only take medicine as told by your doctor.  · If given, take your medicines (antibiotics) as told. Finish them even if you start to feel better.  · Use a cold steam vaporizer or humidifier in your home. This can help loosen thick spit (secretions).  · Sleep so you are almost sitting up (semi-upright). Use pillows to do this. This helps reduce coughing.  · Rest as needed.  · Stop smoking if you smoke.  GET HELP RIGHT AWAY IF:  · You have yellowish-white fluid (pus) in your thick spit.  · Your cough gets worse.  · Your medicine does not reduce coughing, and you are losing sleep.  · You cough up blood.  · You have trouble breathing.  · Your pain gets worse and medicine does not help.  · You have a fever.  MAKE SURE YOU:   · Understand these instructions.  · Will watch your condition.  · Will get help right away if you are not doing well or get worse.  Document Released: 09/09/2010 Document Revised: 05/13/2013 Document Reviewed: 09/09/2010  ExitCare® Patient Information ©2015 [employer]. This information is not intended to replace advice given to you by your health care provider. Make sure you discuss any questions you have with your health care provider.

## 2014-05-28 NOTE — ED Notes (Signed)
Respiratory at bedside.

## 2014-05-28 NOTE — ED Notes (Signed)
4 day history of chest congestion and cough w/chills.  Does not know if had fever. No n/v/d. No antibx in last 30 days.

## 2014-05-31 NOTE — ED Provider Notes (Signed)
CSN: [id number]     Arrival date & time 05/28/14  36 History   First MD Initiated Contact with Patient 05/28/14 1108     Chief Complaint  Patient presents with  . Cough     (Consider location/radiation/quality/duration/timing/severity/associated sxs/prior Treatment) HPI   Jacob Reed is a 49 y.o. male who presents to the Emergency Department complaining of cough and chest congestion for 4 days.  He states cough has been occasionally productive and worse at night.  He also c/o sweats and chills.  Unsure of fever.  He denies shortness of breath, chest pain, abdominal pain or vomiting.  He has been taking tylenol without relief.     Past Medical History  Diagnosis Date  . Arthritis   . Anxiety   . Sinusitis   . Back pain   . Dental caries    Past Surgical History  Procedure Laterality Date  . Vasectomy    . Tooth extraction    . Inguinal hernia repair Right 10/01/2012    Procedure: HERNIA REPAIR INGUINAL ADULT;  Surgeon: Scherry Ran, MD;  Location: [facility];  Service: General;  Laterality: Right;   Family History  Problem Relation Age of Onset  . Heart disease Father    History  Substance Use Topics  . Smoking status: Former Research scientist (life sciences)  . Smokeless tobacco: Current User    Types: Snuff  . Alcohol Use: No    Review of Systems  Constitutional: Negative for fever, chills and appetite change.  HENT: Positive for congestion. Negative for sore throat and trouble swallowing.   Respiratory: Positive for cough and chest tightness. Negative for shortness of breath and wheezing.   Cardiovascular: Negative for chest pain.  Gastrointestinal: Negative for nausea, vomiting and abdominal pain.  Genitourinary: Negative for dysuria.  Musculoskeletal: Negative for arthralgias.  Skin: Negative for rash.  Neurological: Negative for dizziness, weakness and numbness.  Hematological: Negative for adenopathy.  All other systems reviewed and are negative.     Allergies   Tramadol  Home Medications   Prior to Admission medications   Medication Sig Start Date End Date Taking? Authorizing Provider  acetaminophen (TYLENOL) 500 MG tablet Take 250 mg by mouth daily as needed for moderate pain.   Yes Historical Provider, MD  diazepam (VALIUM) 5 MG tablet TAKE 1 TABLET BY MOUTH 2 TIMES DAILY AS NEEDED FOR ANXIETY. MUST LAST30 DAYS. 05/09/14  Yes Alycia Rossetti, MD  aspirin EC 325 MG tablet Take 1 tablet (325 mg total) by mouth daily. Patient not taking: Reported on 05/28/2014 06/10/13   Riki Altes, MD  azithromycin (ZITHROMAX) 250 MG tablet Take 1 tablet (250 mg total) by mouth daily. Take first 2 tablets together, then 1 every day until finished. 05/28/14   Vicki Pasqual, PA-C  escitalopram (LEXAPRO) 10 MG tablet Take 1 tablet (10 mg total) by mouth at bedtime. For mood Patient not taking: Reported on 05/28/2014 01/20/14   Alycia Rossetti, MD  guaiFENesin-codeine [facility]) 100-10 MG/5ML syrup Take 10 mLs by mouth 3 (three) times daily as needed. 05/28/14   Kori Goins, PA-C   BP 135/86 mmHg  Pulse 94  Temp(Src) 99.6 F (37.6 C) (Oral)  Resp 16  Ht 6\' 2"  (1.88 m)  Wt 173 lb (78.472 kg)  BMI 22.20 kg/m2  SpO2 98% Physical Exam  Constitutional: He appears well-developed and well-nourished. No distress.  HENT:  Head: Normocephalic and atraumatic.  Right Ear: Tympanic membrane and ear canal normal.  Left Ear: Tympanic membrane and  ear canal normal.  Mouth/Throat: Uvula is midline, oropharynx is clear and moist and mucous membranes are normal.  Neck: Normal range of motion. Neck supple.  Cardiovascular: Normal rate, regular rhythm, normal heart sounds and intact distal pulses.   No murmur heard. Pulmonary/Chest: Effort normal. No respiratory distress. He has wheezes. He has no rales.  Few scattered expiratory wheezes.  No rales  Musculoskeletal: Normal range of motion.  Lymphadenopathy:    He has no cervical adenopathy.  Neurological: He is alert.  He exhibits normal muscle tone. Coordination normal.  Skin: Skin is warm and dry. No rash noted.  Nursing note and vitals reviewed.   ED Course  Procedures (including critical care time) Labs Review Labs Reviewed - No data to display  Imaging Review Dg Chest 2 View  05/28/2014   CLINICAL DATA:  Productive cough, chills starting 05/25/2014  EXAM: CHEST  2 VIEW  COMPARISON:  06/10/2013  FINDINGS: Cardiomediastinal silhouette is stable. No acute infiltrate or pleural effusion. No pulmonary edema. Bony thorax is unremarkable.  IMPRESSION: No active cardiopulmonary disease.   Electronically Signed   By: Lahoma Crocker M.D.   On: 05/28/2014 11:52      EKG Interpretation None      MDM   Final diagnoses:  Cough    Pt is well appearing, non-toxic.  Vitals stable.  PERC neg.  Albuterol inhaler dispensed.  Pt agrees to tx plan and close PMD f/u    Kem Parkinson, PA-C 05/31/14 Rafael Gonzalez, MD 06/02/14 [phone]

## 2014-06-13 ENCOUNTER — Other Ambulatory Visit: Payer: Self-pay | Admitting: Family Medicine

## 2014-06-13 NOTE — Telephone Encounter (Signed)
okay

## 2014-06-13 NOTE — Telephone Encounter (Signed)
Ok to refill??  Last office visit 02/13/2014.  Last refill 05/09/2014.

## 2014-06-13 NOTE — Telephone Encounter (Signed)
Spoke with Ovid Curd at [facility], script called in

## 2014-07-11 ENCOUNTER — Emergency Department (HOSPITAL_COMMUNITY): Payer: Medicare Other

## 2014-07-11 ENCOUNTER — Emergency Department (HOSPITAL_COMMUNITY)
Admission: EM | Admit: 2014-07-11 | Discharge: 2014-07-11 | Disposition: A | Discharge status: Home / Self Care | Payer: Medicare Other | Attending: Emergency Medicine | Admitting: Emergency Medicine

## 2014-07-11 ENCOUNTER — Encounter (HOSPITAL_COMMUNITY): Payer: Self-pay | Admitting: Emergency Medicine

## 2014-07-11 DIAGNOSIS — Z8719 Personal history of other diseases of the digestive system: Secondary | ICD-10-CM | POA: Insufficient documentation

## 2014-07-11 DIAGNOSIS — Z87891 Personal history of nicotine dependence: Secondary | ICD-10-CM | POA: Diagnosis not present

## 2014-07-11 DIAGNOSIS — M1990 Unspecified osteoarthritis, unspecified site: Secondary | ICD-10-CM | POA: Diagnosis not present

## 2014-07-11 DIAGNOSIS — F419 Anxiety disorder, unspecified: Secondary | ICD-10-CM | POA: Insufficient documentation

## 2014-07-11 DIAGNOSIS — Z8709 Personal history of other diseases of the respiratory system: Secondary | ICD-10-CM | POA: Insufficient documentation

## 2014-07-11 DIAGNOSIS — Z7982 Long term (current) use of aspirin: Secondary | ICD-10-CM | POA: Diagnosis not present

## 2014-07-11 DIAGNOSIS — Z792 Long term (current) use of antibiotics: Secondary | ICD-10-CM | POA: Diagnosis not present

## 2014-07-11 DIAGNOSIS — M549 Dorsalgia, unspecified: Secondary | ICD-10-CM | POA: Diagnosis present

## 2014-07-11 DIAGNOSIS — M545 Low back pain: Secondary | ICD-10-CM | POA: Diagnosis not present

## 2014-07-11 MED ORDER — DICLOFENAC SODIUM 50 MG PO TBEC: 50.0000 mg | DELAYED_RELEASE_TABLET | Freq: Two times a day (BID) | ORAL | Status: DC

## 2014-07-11 MED ORDER — METHOCARBAMOL 500 MG PO TABS: 500.0000 mg | ORAL_TABLET | Freq: Two times a day (BID) | ORAL | Status: DC

## 2014-07-11 MED ORDER — CYCLOBENZAPRINE HCL 10 MG PO TABS
10.0000 mg | ORAL_TABLET | Freq: Once | ORAL | Status: AC
  Administered 2014-07-11: 10 mg via ORAL
  Filled 2014-07-11: qty 1

## 2014-07-11 NOTE — ED Notes (Signed)
Patient complaining of lower back pain x 3 days. Denies injury.

## 2014-07-11 NOTE — Discharge Instructions (Signed)
Your x-ray today shows no fracture of your lower back. Do not take the muscle relaxant if driving as it will make you sleepy. Call Dr. Aline Brochure for a follow up appointment.

## 2014-07-11 NOTE — ED Provider Notes (Signed)
CSN: [id number]     Arrival date & time 07/11/14  1653 History   First MD Initiated Contact with Patient 07/11/14 1733     Chief Complaint  Patient presents with  . Back Pain     (Consider location/radiation/quality/duration/timing/severity/associated sxs/prior Treatment) Patient is a 49 y.o. male presenting with back pain. The history is provided by the patient.  Back Pain Location:  Lumbar spine Quality:  Stabbing Radiates to:  Does not radiate Pain severity:  Severe Pain is:  Same all the time Onset quality:  Gradual Duration:  3 days Timing:  Constant Progression:  Worsening Chronicity:  New Relieved by:  Nothing Worsened by:  Movement, coughing and deep breathing Ineffective treatments:  OTC medications (tylenol) Associated symptoms: no bladder incontinence and no bowel incontinence    Jacob Reed is a 49 y.o. male who presents to the ED with left side low back pain that started 3 days ago. He has taken tylenol and using topical creams without relief. He had back problems in the past due to arthritis but has been doing well for the past year. Patient states he used to take Vicodin when he had an episode but his doctor stopped prescribing them due to people abusing narcotics.   Past Medical History  Diagnosis Date  . Arthritis   . Anxiety   . Sinusitis   . Back pain   . Dental caries    Past Surgical History  Procedure Laterality Date  . Vasectomy    . Tooth extraction    . Inguinal hernia repair Right 10/01/2012    Procedure: HERNIA REPAIR INGUINAL ADULT;  Surgeon: Scherry Ran, MD;  Location: [facility];  Service: General;  Laterality: Right;   Family History  Problem Relation Age of Onset  . Heart disease Father    History  Substance Use Topics  . Smoking status: Former Research scientist (life sciences)  . Smokeless tobacco: Current User    Types: Snuff  . Alcohol Use: No    Review of Systems  Gastrointestinal: Negative for bowel incontinence.  Genitourinary: Negative for  bladder incontinence.  Musculoskeletal: Positive for back pain.  all other systems negative    Allergies  Tramadol  Home Medications   Prior to Admission medications   Medication Sig Start Date End Date Taking? Authorizing Provider  acetaminophen (TYLENOL) 500 MG tablet Take 250 mg by mouth daily as needed for moderate pain.    Historical Provider, MD  aspirin EC 325 MG tablet Take 1 tablet (325 mg total) by mouth daily. Patient not taking: Reported on 05/28/2014 06/10/13   Riki Altes, MD  azithromycin (ZITHROMAX) 250 MG tablet Take 1 tablet (250 mg total) by mouth daily. Take first 2 tablets together, then 1 every day until finished. 05/28/14   Tammy Triplett, PA-C  diazepam (VALIUM) 5 MG tablet TAKE 1 TABLET BY MOUTH 2 TIMES DAILY AS NEEDED FOR ANXIETY. MUST LAST30 DAYS. 06/13/14   Alycia Rossetti, MD  diclofenac (VOLTAREN) 50 MG EC tablet Take 1 tablet (50 mg total) by mouth 2 (two) times daily. 07/11/14   Hope Bunnie Pion, NP  escitalopram (LEXAPRO) 10 MG tablet Take 1 tablet (10 mg total) by mouth at bedtime. For mood Patient not taking: Reported on 05/28/2014 01/20/14   Alycia Rossetti, MD  guaiFENesin-codeine [facility]) 100-10 MG/5ML syrup Take 10 mLs by mouth 3 (three) times daily as needed. 05/28/14   Tammy Triplett, PA-C  methocarbamol (ROBAXIN) 500 MG tablet Take 1 tablet (500 mg total) by mouth  2 (two) times daily. 07/11/14   Hope Bunnie Pion, NP   BP 117/87 mmHg  Pulse 79  Temp(Src) 98.1 F (36.7 C) (Oral)  Resp 16  Ht 6\' 2"  (1.88 m)  Wt 173 lb (78.472 kg)  BMI 22.20 kg/m2  SpO2 99% Physical Exam  Constitutional: He is oriented to person, place, and time. He appears well-developed and well-nourished. No distress.  HENT:  Head: Normocephalic and atraumatic.  Eyes: EOM are normal. Pupils are equal, round, and reactive to light.  Neck: Normal range of motion. Neck supple.  Cardiovascular: Normal rate and regular rhythm.   Pulmonary/Chest: Effort normal. No respiratory distress.  He has no wheezes. He has no rales.  Abdominal: Soft. Bowel sounds are normal. There is no tenderness.  Musculoskeletal: Normal range of motion. He exhibits no edema.       Lumbar back: He exhibits tenderness. He exhibits normal range of motion, no deformity, no spasm and normal pulse.  Neurological: He is alert and oriented to person, place, and time. He has normal strength. No cranial nerve deficit or sensory deficit. Coordination and gait normal.  Reflex Scores:      Bicep reflexes are 2+ on the right side and 2+ on the left side.      Brachioradialis reflexes are 2+ on the right side and 2+ on the left side.      Patellar reflexes are 2+ on the right side and 2+ on the left side.      Achilles reflexes are 2+ on the right side and 2+ on the left side. Skin: Skin is warm and dry.  Psychiatric: He has a normal mood and affect. His behavior is normal.  Nursing note and vitals reviewed.   ED Course  Procedures (including critical care time) Labs Review Labs Reviewed - No data to display  Imaging Review Dg Lumbar Spine Complete  07/11/2014   CLINICAL DATA:  Lower back pain for 3 days.  No trauma.  EXAM: LUMBAR SPINE - COMPLETE 4+ VIEW  COMPARISON:  03/27/2008  FINDINGS: Diminutive twelfth right rib. Five lumbar type vertebral bodies. Sacroiliac joints are symmetric. Maintenance of vertebral body height and alignment. Loss of intervertebral disc height at the lumbosacral junction. Large colonic stool burden.  IMPRESSION: No acute osseous abnormality.  Possible constipation.   Electronically Signed   By: Abigail Miyamoto M.D.   On: 07/11/2014 18:26     MDM  49 y.o. male with low back pain that started 3 days ago. Stable for d/c without focal neuro deficits and no acute findings on x-ray. Will treat for muscle spasm and he will follow up with ortho. Discussed with the patient and all questioned fully answered. He will return if any problems arise.   Final diagnoses:  Low back pain without  sciatica, unspecified back pain laterality       [facility], NP 07/14/14 1745  Milton Ferguson, MD 07/15/14 1212

## 2014-07-11 NOTE — ED Notes (Signed)
Patient with no complaints at this time. Respirations even and unlabored. Skin warm/dry. Discharge instructions reviewed with patient at this time. Patient given opportunity to voice concerns/ask questions. Patient discharged at this time and left Emergency Department with steady gait.   

## 2014-07-14 ENCOUNTER — Other Ambulatory Visit: Payer: Self-pay | Admitting: Family Medicine

## 2014-07-15 NOTE — Telephone Encounter (Signed)
Okay to refill give 2 

## 2014-07-15 NOTE — Telephone Encounter (Signed)
LRF 06/13/14 #45  LOV 02/13/14  OK refill?

## 2014-07-15 NOTE — Telephone Encounter (Signed)
Medication refilled per protocol. 

## 2014-08-14 ENCOUNTER — Ambulatory Visit (INDEPENDENT_AMBULATORY_CARE_PROVIDER_SITE_OTHER): Payer: Medicare Other | Admitting: Physician Assistant

## 2014-08-14 ENCOUNTER — Encounter: Payer: Self-pay | Admitting: Physician Assistant

## 2014-08-14 VITALS — BP 100/60 | HR 80 | Temp 97.7°F | Resp 18 | Wt 174.0 lb

## 2014-08-14 DIAGNOSIS — M545 Low back pain: Secondary | ICD-10-CM | POA: Diagnosis not present

## 2014-08-14 NOTE — Progress Notes (Signed)
Patient ID: Jacob Reed MRN: [id number], DOB: 1965/03/15, 49 y.o. Date of Encounter: 08/14/2014, 2:18 PM    Chief Complaint:  Chief Complaint  Patient presents with  . Follow-up    6 mos f/u, left foot gets numb     HPI: 49 y.o. year old white male says that he has been feeling his right foot feeling numb when he crosses right leg over the left leg.  Says he thinks he started noticing this about the same time as when he had to go to the emergency room and see Kem Parkinson at the [facility] ER secondary to low back pain.  Says that he has no numbness or tingling or paresthesias in the right foot except for when he crosses the leg over the other leg. Does not feel this when he is sitting at rest standing or walking etc. Also no pain no numbness no tingling down the leg. No weakness in the leg or the foot. No incontinence of bowel or bladder. Says the only medicine he is taking currently is Tylenol.     Home Meds:   Outpatient Prescriptions Prior to Visit  Medication Sig Dispense Refill  . acetaminophen (TYLENOL) 500 MG tablet Take 250 mg by mouth daily as needed for moderate pain.    Marland Kitchen aspirin EC 325 MG tablet Take 1 tablet (325 mg total) by mouth daily. 30 tablet 0  . diazepam (VALIUM) 5 MG tablet TAKE 1 TABLET BY MOUTH 2 TIMES DAILY AS NEEDED FOR ANXIETY. MUST LAST30 DAYS. 45 tablet 2  . diclofenac (VOLTAREN) 50 MG EC tablet Take 1 tablet (50 mg total) by mouth 2 (two) times daily. 15 tablet 0  . escitalopram (LEXAPRO) 10 MG tablet Take 1 tablet (10 mg total) by mouth at bedtime. For mood 30 tablet 3  . methocarbamol (ROBAXIN) 500 MG tablet Take 1 tablet (500 mg total) by mouth 2 (two) times daily. 20 tablet 0  . azithromycin (ZITHROMAX) 250 MG tablet Take 1 tablet (250 mg total) by mouth daily. Take first 2 tablets together, then 1 every day until finished. (Patient not taking: Reported on 08/14/2014) 6 tablet 0  . guaiFENesin-codeine (ROBITUSSIN AC) 100-10 MG/5ML syrup Take 10  mLs by mouth 3 (three) times daily as needed. (Patient not taking: Reported on 08/14/2014) 120 mL 0   No facility-administered medications prior to visit.    Allergies:  Allergies  Allergen Reactions  . Tramadol Hives      Review of Systems: See HPI for pertinent ROS. All other ROS negative.    Physical Exam: Blood pressure 100/60, pulse 80, temperature 97.7 F (36.5 C), temperature source Oral, resp. rate 18, weight 174 lb (78.926 kg)., Body mass index is 22.33 kg/(m^2). General:  WNWD WM. Appears in no acute distress. Neck: Supple. No thyromegaly. No lymphadenopathy. Lungs: Clear bilaterally to auscultation without wheezes, rales, or rhonchi. Breathing is unlabored. Heart: Regular rhythm. No murmurs, rubs, or gallops. Msk:  Strength and tone normal for age. Extremities/Skin: Warm and dry.  No edema.  Neuro: Alert and oriented X 3. Moves all extremities spontaneously. Gait is normal. CNII-XII grossly in tact. 2+ Patella reflexes, equal bilaterally. Psych:  Responds to questions appropriately with a normal affect.     ASSESSMENT AND PLAN:  49 y.o. year old male with  1. Low back pain without sciatica, unspecified back pain laterality X-ray lumbar spine 07/11/2014 was normal. Patient is only experiencing symptoms when he crosses the right leg over the left. Reassure him  that symptoms secondary to compression of nerve and to adjust his position to avoid compression of nerves.  F/U if develops symptoms of sciatica or radiculopathy.   Marin Olp [place], [place], [facility] 08/14/2014 2:18 PM

## 2014-10-13 ENCOUNTER — Other Ambulatory Visit: Payer: Self-pay | Admitting: Family Medicine

## 2014-10-13 NOTE — Telephone Encounter (Signed)
Okay to refill? 

## 2014-10-13 NOTE — Telephone Encounter (Signed)
Ok to refill??  Last office visit 08/14/2014.  Last refill 07/15/2014, #2 refills.

## 2014-10-14 NOTE — Telephone Encounter (Signed)
Medication refilled per protocol. 

## 2014-10-14 NOTE — Telephone Encounter (Signed)
Pt calling back to check on med refill.

## 2014-11-13 ENCOUNTER — Other Ambulatory Visit: Payer: Self-pay | Admitting: Family Medicine

## 2014-11-13 NOTE — Telephone Encounter (Signed)
Ok to refill??  Last office visit 08/14/2014.  Last refill 10/14/2014.

## 2014-11-14 NOTE — Telephone Encounter (Signed)
Refill appropriate and filled per protocol. 

## 2014-11-14 NOTE — Telephone Encounter (Signed)
Okay to refill give 3 

## 2014-12-17 ENCOUNTER — Encounter: Payer: Medicare Other | Admitting: Family Medicine

## 2014-12-30 ENCOUNTER — Encounter: Payer: Self-pay | Admitting: Family Medicine

## 2014-12-30 ENCOUNTER — Ambulatory Visit (INDEPENDENT_AMBULATORY_CARE_PROVIDER_SITE_OTHER): Payer: Medicare Other | Admitting: Family Medicine

## 2014-12-30 VITALS — BP 128/62 | HR 80 | Temp 98.4°F | Resp 16 | Ht 74.0 in | Wt 169.0 lb

## 2014-12-30 DIAGNOSIS — G4700 Insomnia, unspecified: Secondary | ICD-10-CM | POA: Diagnosis not present

## 2014-12-30 DIAGNOSIS — Z0000 Encounter for general adult medical examination without abnormal findings: Secondary | ICD-10-CM | POA: Diagnosis not present

## 2014-12-30 DIAGNOSIS — I10 Essential (primary) hypertension: Secondary | ICD-10-CM | POA: Diagnosis not present

## 2014-12-30 DIAGNOSIS — J069 Acute upper respiratory infection, unspecified: Secondary | ICD-10-CM | POA: Diagnosis not present

## 2014-12-30 DIAGNOSIS — Z125 Encounter for screening for malignant neoplasm of prostate: Secondary | ICD-10-CM

## 2014-12-30 DIAGNOSIS — R296 Repeated falls: Secondary | ICD-10-CM | POA: Diagnosis not present

## 2014-12-30 DIAGNOSIS — F418 Other specified anxiety disorders: Secondary | ICD-10-CM | POA: Diagnosis not present

## 2014-12-30 DIAGNOSIS — Z1211 Encounter for screening for malignant neoplasm of colon: Secondary | ICD-10-CM | POA: Diagnosis not present

## 2014-12-30 DIAGNOSIS — R42 Dizziness and giddiness: Secondary | ICD-10-CM

## 2014-12-30 LAB — COMPREHENSIVE METABOLIC PANEL
ALT: 12 U/L (ref 9–46)
AST: 18 U/L (ref 10–35)
Albumin: 4.9 g/dL (ref 3.6–5.1)
Alkaline Phosphatase: 111 U/L (ref 40–115)
BILIRUBIN TOTAL: 0.8 mg/dL (ref 0.2–1.2)
BUN: 9 mg/dL (ref 7–25)
CALCIUM: 9.3 mg/dL (ref 8.6–10.3)
CO2: 26 mmol/L (ref 20–31)
Chloride: 99 mmol/L (ref 98–110)
Creat: 1.12 mg/dL (ref 0.70–1.33)
GLUCOSE: 84 mg/dL (ref 70–99)
Potassium: 4 mmol/L (ref 3.5–5.3)
Sodium: 139 mmol/L (ref 135–146)
Total Protein: 7.6 g/dL (ref 6.1–8.1)

## 2014-12-30 LAB — CBC WITH DIFFERENTIAL/PLATELET
BASOS PCT: 0 % (ref 0–1)
Basophils Absolute: 0 10*3/uL (ref 0.0–0.1)
Eosinophils Absolute: 0.1 10*3/uL (ref 0.0–0.7)
Eosinophils Relative: 1 % (ref 0–5)
HEMATOCRIT: 44 % (ref 39.0–52.0)
Hemoglobin: 14.7 g/dL (ref 13.0–17.0)
LYMPHS PCT: 20 % (ref 12–46)
Lymphs Abs: 1.5 10*3/uL (ref 0.7–4.0)
MCH: 29.3 pg (ref 26.0–34.0)
MCHC: 33.4 g/dL (ref 30.0–36.0)
MCV: 87.6 fL (ref 78.0–100.0)
MONO ABS: 0.7 10*3/uL (ref 0.1–1.0)
MPV: 11 fL (ref 8.6–12.4)
Monocytes Relative: 9 % (ref 3–12)
Neutro Abs: 5.3 10*3/uL (ref 1.7–7.7)
Neutrophils Relative %: 70 % (ref 43–77)
Platelets: 189 10*3/uL (ref 150–400)
RBC: 5.02 MIL/uL (ref 4.22–5.81)
RDW: 13.4 % (ref 11.5–15.5)
WBC: 7.5 10*3/uL (ref 4.0–10.5)

## 2014-12-30 LAB — LIPID PANEL
CHOL/HDL RATIO: 3.6 ratio (ref <=5.0)
Cholesterol: 136 mg/dL (ref 125–200)
HDL: 38 mg/dL — AB (ref >=40)
LDL Cholesterol: 77 mg/dL (ref <130)
Triglycerides: 104 mg/dL (ref <150)
VLDL: 21 mg/dL (ref <30)

## 2014-12-30 MED ORDER — AZITHROMYCIN 250 MG PO TABS: ORAL_TABLET | ORAL | Status: DC

## 2014-12-30 MED ORDER — ESCITALOPRAM OXALATE 20 MG PO TABS: 20.0000 mg | ORAL_TABLET | Freq: Every day | ORAL | Status: DC

## 2014-12-30 MED ORDER — BENZONATATE 100 MG PO CAPS: 100.0000 mg | ORAL_CAPSULE | Freq: Two times a day (BID) | ORAL | Status: DC | PRN

## 2014-12-30 NOTE — Assessment & Plan Note (Signed)
persistant symptoms, I think a lot is anxiety related, has seen ENT no specific cause of dizziness, tinnitus, but now having balance issues and falls Obtain MRI brain r/o CNS pathology

## 2014-12-30 NOTE — Patient Instructions (Signed)
Take antibiotics as prescribed Take cough pills  Lexapro increased to 20mg  Scan of brain to be done Referral to GI for colonoscopy  F/U 4 months

## 2014-12-30 NOTE — Assessment & Plan Note (Signed)
Difficult social situation, increase lexapro to 20mg  , continue valium for insomnia as well.

## 2014-12-30 NOTE — Addendum Note (Signed)
Addended by: Vic Blackbird F on: 12/30/2014 01:43 PM   Modules accepted: Orders

## 2014-12-30 NOTE — Progress Notes (Signed)
Patient ID: Jacob Reed, male   DOB: [birth year], 49 y.o.   MRN: [id number]  Orthostatic Blood Pressure:  Lying: 124/64  Sitting: 122/64  Standing: 128/62
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
## 2014-12-31 ENCOUNTER — Encounter: Payer: Self-pay | Admitting: *Deleted

## 2014-12-31 LAB — PSA, MEDICARE: PSA: 2.17 ng/mL (ref <=4.00)

## 2015-01-01 ENCOUNTER — Encounter (INDEPENDENT_AMBULATORY_CARE_PROVIDER_SITE_OTHER): Payer: Self-pay | Admitting: *Deleted

## 2015-01-01 ENCOUNTER — Encounter: Payer: Self-pay | Admitting: *Deleted

## 2015-01-04 ENCOUNTER — Encounter (HOSPITAL_COMMUNITY): Payer: Self-pay | Admitting: Emergency Medicine

## 2015-01-04 ENCOUNTER — Emergency Department (HOSPITAL_COMMUNITY)
Admission: EM | Admit: 2015-01-04 | Discharge: 2015-01-04 | Disposition: A | Discharge status: Home / Self Care | Payer: Medicare Other | Attending: Emergency Medicine | Admitting: Emergency Medicine

## 2015-01-04 DIAGNOSIS — Z8719 Personal history of other diseases of the digestive system: Secondary | ICD-10-CM | POA: Insufficient documentation

## 2015-01-04 DIAGNOSIS — M1990 Unspecified osteoarthritis, unspecified site: Secondary | ICD-10-CM | POA: Diagnosis not present

## 2015-01-04 DIAGNOSIS — Z79899 Other long term (current) drug therapy: Secondary | ICD-10-CM | POA: Diagnosis not present

## 2015-01-04 DIAGNOSIS — J069 Acute upper respiratory infection, unspecified: Secondary | ICD-10-CM | POA: Insufficient documentation

## 2015-01-04 DIAGNOSIS — F419 Anxiety disorder, unspecified: Secondary | ICD-10-CM | POA: Insufficient documentation

## 2015-01-04 DIAGNOSIS — Z87891 Personal history of nicotine dependence: Secondary | ICD-10-CM | POA: Diagnosis not present

## 2015-01-04 DIAGNOSIS — Z7982 Long term (current) use of aspirin: Secondary | ICD-10-CM | POA: Diagnosis not present

## 2015-01-04 DIAGNOSIS — Z791 Long term (current) use of non-steroidal anti-inflammatories (NSAID): Secondary | ICD-10-CM | POA: Insufficient documentation

## 2015-01-04 DIAGNOSIS — R0981 Nasal congestion: Secondary | ICD-10-CM | POA: Diagnosis present

## 2015-01-04 MED ORDER — PREDNISONE 20 MG PO TABS: 40.0000 mg | ORAL_TABLET | Freq: Every day | ORAL | Status: DC

## 2015-01-04 MED ORDER — GUAIFENESIN-CODEINE 100-10 MG/5ML PO SYRP: 10.0000 mL | ORAL_SOLUTION | Freq: Three times a day (TID) | ORAL | Status: DC | PRN

## 2015-01-04 NOTE — ED Provider Notes (Signed)
CSN: [id number]     Arrival date & time 01/04/15  1149 History  By signing my name below, I, [name], attest that this documentation has been prepared under the direction and in the presence of Vitalia Stough, PA-C. Electronically Signed: [name], Scribe. 01/04/2015. 12:47 PM.    Chief Complaint  Patient presents with  . Nasal Congestion    The history is provided by the patient. No language interpreter was used.    HPI Comments: Jacob Reed is a 49 y.o. male who presents to the Emergency Department complaining of cold symptoms including a productive cough with green sputum onset ten days ago. He describes nasal congestion and runny nose.  Pt reports that he has tried taking pain relievers and has tried drinking hot tea and staying indoors with no relief. He also notes that he had a recent appointment with his PCP  five days ago where he was prescribed tessalon and a z-pak. Pt notes that he did not have enough money at the time to buy the [place] but states that he did receive the z-pak but has not yet started it because he was first trying to speak with his dentist about plans to get two of his teeth pulled. He denies fever, CP, shortness of breath, headache or neck pain  Past Medical History  Diagnosis Date  . Arthritis   . Anxiety   . Sinusitis   . Back pain   . Dental caries    Past Surgical History  Procedure Laterality Date  . Vasectomy    . Tooth extraction    . Inguinal hernia repair Right 10/01/2012    Procedure: HERNIA REPAIR INGUINAL ADULT;  Surgeon: Scherry Ran, MD;  Location: [facility];  Service: General;  Laterality: Right;   Family History  Problem Relation Age of Onset  . Heart disease Father    Social History  Substance Use Topics  . Smoking status: Former Research scientist (life sciences)  . Smokeless tobacco: Current User    Types: Snuff  . Alcohol Use: No    Review of Systems  Constitutional: Negative for fever, activity change and appetite change.   HENT: Positive for congestion, rhinorrhea and sneezing. Negative for ear pain, facial swelling, sore throat and trouble swallowing.   Respiratory: Positive for cough. Negative for chest tightness and wheezing.   Cardiovascular: Negative for chest pain.  Musculoskeletal: Negative for neck pain and neck stiffness.  Neurological: Negative for dizziness and headaches.  All other systems reviewed and are negative.  Allergies  Tramadol  Home Medications   Prior to Admission medications   Medication Sig Start Date End Date Taking? Authorizing Provider  acetaminophen (TYLENOL) 500 MG tablet Take 250 mg by mouth daily as needed for moderate pain.    Historical Provider, MD  aspirin EC 325 MG tablet Take 1 tablet (325 mg total) by mouth daily. 06/10/13   Riki Altes, MD  azithromycin (ZITHROMAX) 250 MG tablet Take 2 tablets x 1 day, then 1 tab daily for 4 days 12/30/14   Alycia Rossetti, MD  benzonatate (TESSALON) 100 MG capsule Take 1 capsule (100 mg total) by mouth 2 (two) times daily as needed for cough. 12/30/14   Alycia Rossetti, MD  diazepam (VALIUM) 5 MG tablet TAKE 1 TABLET BY MOUTH 2 TIMES DAILY AS NEEDED FOR ANXIETY. MUST LAST30 DAYS. 11/14/14   Alycia Rossetti, MD  diclofenac (VOLTAREN) 50 MG EC tablet Take 1 tablet (50 mg total) by mouth 2 (two) times  daily. 07/11/14   Hope Bunnie Pion, NP  escitalopram (LEXAPRO) 20 MG tablet Take 1 tablet (20 mg total) by mouth at bedtime. For mood 12/30/14   Alycia Rossetti, MD  methocarbamol (ROBAXIN) 500 MG tablet Take 1 tablet (500 mg total) by mouth 2 (two) times daily. 07/11/14   Hope Bunnie Pion, NP   BP 113/75 mmHg  Pulse 106  Temp(Src) 97.6 F (36.4 C) (Oral)  Resp 15  Ht 6\' 2"  (1.88 m)  Wt 168 lb (76.204 kg)  BMI 21.56 kg/m2  SpO2 99% Physical Exam  Constitutional: He is oriented to person, place, and time. He appears well-developed and well-nourished. No distress.  HENT:  Head: Normocephalic and atraumatic.  Nose: Mucosal edema and rhinorrhea  present.  Mouth/Throat: Uvula is midline, oropharynx is clear and moist and mucous membranes are normal.  Air fluid levels bilateral TM's No erythema or bulging   Eyes: Conjunctivae are normal.  Neck: Normal range of motion.  Cardiovascular: Normal rate, regular rhythm and normal heart sounds.   Pulmonary/Chest: Effort normal and breath sounds normal. No respiratory distress. He has no wheezes. He has no rales.  Musculoskeletal: Normal range of motion.  Lymphadenopathy:    He has no cervical adenopathy.  Neurological: He is alert and oriented to person, place, and time.  Skin: Skin is warm and dry.  Psychiatric: He has a normal mood and affect. His behavior is normal. Judgment and thought content normal.  Nursing note and vitals reviewed.  ED Course  Procedures  DIAGNOSTIC STUDIES:    Oxygen Saturation is 99% on RA, normal by my interpretation.   COORDINATION OF CARE:  12:11 PM Pt's PCP visit from 12/20 was reviewed during which he was prescribed tessalon and z-pack. Advise pt to start on the antibiotic. Will prescribe pt cough syrup medication and steroid  Discussed treatment plan with pt at bedside and pt agreed to plan.    MDM   Final diagnoses:  URI (upper respiratory infection)    Pt is well appearing, vitals stable.    I personally performed the services described in this documentation, which was scribed in my presence. The recorded information has been reviewed and is accurate.'   Kem Parkinson, PA-C 01/06/15 2123  Tanna Furry, MD 01/16/15 [phone]

## 2015-01-04 NOTE — Discharge Instructions (Signed)

## 2015-01-04 NOTE — ED Notes (Signed)
Pt states cough, which recently became productive ~ 1 week ago and green in color initially began Dec. 15. Pt was seen by PCP 12/20 and was given cough medication which he was unable to afford to pick up. He was also given PCN for teeth in preparation of having them pulled. Pt states cough is worse in the mornings and at night along with nasal "stuffiness" Denies fever. Denies maxillary tenderness. Lung sounds clear.

## 2015-01-04 NOTE — ED Notes (Signed)
Pt reports nasal/sinus congestion since Dec.15. Pt reports cough, especially when he lies down.

## 2015-01-08 ENCOUNTER — Ambulatory Visit (HOSPITAL_COMMUNITY): Payer: Medicare Other

## 2015-01-22 ENCOUNTER — Ambulatory Visit (HOSPITAL_COMMUNITY): Payer: Medicare Other

## 2015-02-12 ENCOUNTER — Other Ambulatory Visit: Payer: Self-pay | Admitting: Family Medicine

## 2015-02-12 NOTE — Telephone Encounter (Signed)
LRF 11/14/14 #45 + 3  LOV 12/30/14  OK refill?

## 2015-02-13 NOTE — Telephone Encounter (Signed)
Okay 

## 2015-02-13 NOTE — Telephone Encounter (Signed)
rx called in

## 2015-04-01 ENCOUNTER — Ambulatory Visit: Payer: Medicare Other | Admitting: Family Medicine

## 2015-04-07 ENCOUNTER — Encounter: Payer: Self-pay | Admitting: Family Medicine

## 2015-05-04 ENCOUNTER — Ambulatory Visit: Payer: Medicare Other | Admitting: Family Medicine

## 2015-05-05 ENCOUNTER — Encounter: Payer: Self-pay | Admitting: Family Medicine

## 2015-05-11 ENCOUNTER — Other Ambulatory Visit: Payer: Self-pay | Admitting: Family Medicine

## 2015-05-11 NOTE — Telephone Encounter (Signed)
Ok to refill??  Last office visit 12/30/2014.  Last refill 02/13/2015, #2 refills.

## 2015-05-11 NOTE — Telephone Encounter (Signed)
Okay to refill? 

## 2015-05-11 NOTE — Telephone Encounter (Signed)
Medication called to pharmacy.  Requires office visit before any further refills can be given.   Letter sent.

## 2015-05-22 ENCOUNTER — Ambulatory Visit (INDEPENDENT_AMBULATORY_CARE_PROVIDER_SITE_OTHER): Payer: Medicare Other | Admitting: Family Medicine

## 2015-05-22 ENCOUNTER — Encounter: Payer: Self-pay | Admitting: Family Medicine

## 2015-05-22 VITALS — BP 120/78 | HR 72 | Temp 98.3°F | Resp 18 | Ht 74.0 in | Wt 172.0 lb

## 2015-05-22 DIAGNOSIS — F418 Other specified anxiety disorders: Secondary | ICD-10-CM | POA: Diagnosis not present

## 2015-05-22 DIAGNOSIS — G4700 Insomnia, unspecified: Secondary | ICD-10-CM

## 2015-05-22 MED ORDER — ESCITALOPRAM OXALATE 20 MG PO TABS: 20.0000 mg | ORAL_TABLET | Freq: Every day | ORAL | Status: DC
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
## 2015-05-22 NOTE — Patient Instructions (Signed)
F/U Dec for Physical

## 2015-05-22 NOTE — Progress Notes (Signed)
Patient ID: Jacob Reed, male   DOB: 1965/06/02, 50 y.o.   MRN: [id number]    Subjective:    Patient ID: Jacob Reed, male    DOB: 1965-12-22, 50 y.o.   MRN: [id number]  Patient presents for F/U   Here to follow-up chronic medications. He has been on long-term Lexapro and Valium for his depression with anxiety he also has chronic insomnia. He is a lot of stress at home with his significant other as well as dealing with the death of his son last year. He states he has turned to GOD to help him through this time,he does not seem to be getting anywhere with the authorities. He is taking his meds as prescribed and they help with his nerves.     He also had some dental work done recently with no complications  Review Of Systems:  GEN- denies fatigue, fever, weight loss,weakness, recent illness HEENT- denies eye drainage, change in vision, nasal discharge, CVS- denies chest pain, palpitations RESP- denies SOB, cough, wheeze ABD- denies N/V, change in stools, abd pain Neuro- denies headache, dizziness, syncope, seizure activity       Objective:    BP 120/78 mmHg  Pulse 72  Temp(Src) 98.3 F (36.8 C) (Oral)  Resp 18  Ht 6\' 2"  (1.88 m)  Wt 172 lb (78.019 kg)  BMI 22.07 kg/m2 GEN- NAD, alert and oriented x3 CVS- RRR, no murmur RESP-CTAB Psych- Affect at baseline, not overly depressed or anxious appearing, no SI/HI            Assessment & Plan:      Problem List Items Addressed This Visit    Insomnia   Depression with anxiety - Primary    Continue the Lexapro and the Valium at the current dose. This is a very interesting situation for his family with regards to his son's death whether this is suicide or there was foul play. In general he seems to be coping okay. He is thinking about getting a [facility] involved to help with his son's case. apporx 20 minutes spent with patient with greater than 50% on counseling  Note also discussed need for colonoscopy at end of visit, he was  given informative handout on this, he will decide later         Note: This dictation was prepared with Dragon dictation along with smaller phrase technology. Any transcriptional errors that result from this process are unintentional.

## 2015-07-10 ENCOUNTER — Other Ambulatory Visit: Payer: Self-pay | Admitting: Family Medicine

## 2015-07-10 NOTE — Telephone Encounter (Signed)
Okay to refilll give 2

## 2015-07-10 NOTE — Telephone Encounter (Signed)
Ok to refill??  Last office visit 05/22/2015.  Last refill 05/11/2015.

## 2015-07-13 NOTE — Telephone Encounter (Signed)
Medication called to pharmacy. 

## 2015-09-22 ENCOUNTER — Ambulatory Visit (INDEPENDENT_AMBULATORY_CARE_PROVIDER_SITE_OTHER): Payer: Medicare Other | Admitting: Family Medicine

## 2015-09-22 ENCOUNTER — Encounter: Payer: Self-pay | Admitting: Family Medicine

## 2015-09-22 VITALS — BP 122/80 | HR 88 | Temp 98.1°F | Resp 18 | Wt 192.0 lb

## 2015-09-22 DIAGNOSIS — M5030 Other cervical disc degeneration, unspecified cervical region: Secondary | ICD-10-CM | POA: Diagnosis not present

## 2015-09-22 DIAGNOSIS — F418 Other specified anxiety disorders: Secondary | ICD-10-CM | POA: Diagnosis not present

## 2015-09-22 DIAGNOSIS — M5136 Other intervertebral disc degeneration, lumbar region: Secondary | ICD-10-CM | POA: Insufficient documentation

## 2015-09-22 MED ORDER — METHOCARBAMOL 500 MG PO TABS: 500.0000 mg | ORAL_TABLET | Freq: Three times a day (TID) | ORAL | 0 refills | Status: DC | PRN

## 2015-09-22 MED ORDER — FLUOXETINE HCL 10 MG PO TABS: 10.0000 mg | ORAL_TABLET | Freq: Every day | ORAL | 3 refills | Status: DC

## 2015-09-22 MED ORDER — DICLOFENAC SODIUM 50 MG PO TBEC: 50.0000 mg | DELAYED_RELEASE_TABLET | Freq: Two times a day (BID) | ORAL | 2 refills | Status: DC

## 2015-09-22 NOTE — Assessment & Plan Note (Signed)
Exam fairly benign, check c spine xray for progression of DDD Given diclofenac twice a day  He did have some spasm so robaxin refilled

## 2015-09-22 NOTE — Assessment & Plan Note (Signed)
He has not been taking his Lexapro instead I will try him on fluoxetine 10 mg with his Valium. He is a very difficult home situation he is already connected with resources that he knows of. Now he is living alone I think this is bringing out more depression as well as complaints of pain.

## 2015-09-22 NOTE — Patient Instructions (Signed)
For pain- Take diclofenac twice a day with food as needed Get the xray of your neck For your mood- try fluoxetine  F/U in Dec as scheduled

## 2015-09-22 NOTE — Progress Notes (Signed)
   Subjective:    Patient ID: Jacob Reed, male    DOB: 1965/02/17, 50 y.o.   MRN: [id number]  Patient presents for Back Pain (go over MRI/ kneck pain)  Pt here to f/u chronic medical problems and review medications. He continues to take his Valium as prescribed for her depression and anxiety he stopped the lexapro states it made him dizzy. No concerns with medication. A lot of stress at home, his wife left him for another woman, and was then found with drug overdose over the weekend she is  now in [place] [facility]     Chronic neck and back pain, has mild DDD in lumbar spine, neck causing more pain, takes tylenol, in past had MRI in 2000 which was neg, Xray which showed C4-C5/C5-C6 mild bone spurs on right side (1999) He has had increased pain with sleep, no recent injury, had 1 episode of tingling in left finger.           Review Of Systems:  GEN- denies fatigue, fever, weight loss,weakness, recent illness HEENT- denies eye drainage, change in vision, nasal discharge, CVS- denies chest pain, palpitations RESP- denies SOB, cough, wheeze ABD- denies N/V, change in stools, abd pain GU- denies dysuria, hematuria, dribbling, incontinence MSK-+ joint pain, muscle aches, injury Neuro- denies headache, dizziness, syncope, seizure activity       Objective:    BP 122/80 (BP Location: Left Arm, Patient Position: Sitting, Cuff Size: Normal)   Pulse 88   Temp 98.1 F (36.7 C) (Oral)   Resp 18   Wt 192 lb (87.1 kg)   BMI 24.65 kg/m  GEN- NAD, alert and oriented x3 HEENT- PERRL, EOMI, non injected sclera, pink conjunctiva, MMM, oropharynx clear Neck- Supple, fair ROM, mild crepitus, neg spurlings   spasm in trapezus region right side  Psych- flat affect (baseline) , no SI, no hallucinations, not anxious appearing Neuro-CNII-XII intact, no deficits , motor equal bilat UE, sensation in tact  EXT- No edema Pulses- Radial, DP- 2+        Assessment & Plan:      Problem List  Items Addressed This Visit    Depression with anxiety - Primary    He has not been taking his Lexapro instead I will try him on fluoxetine 10 mg with his Valium. He is a very difficult home situation he is already connected with resources that he knows of. Now he is living alone I think this is bringing out more depression as well as complaints of pain.      DDD (degenerative disc disease), cervical    Exam fairly benign, check c spine xray for progression of DDD Given diclofenac twice a day  He did have some spasm so robaxin refilled        Relevant Medications   methocarbamol (ROBAXIN) 500 MG tablet   diclofenac (VOLTAREN) 50 MG EC tablet   Other Relevant Orders   DG Cervical Spine Complete    Other Visit Diagnoses   None.     Note: This dictation was prepared with Dragon dictation along with smaller phrase technology. Any transcriptional errors that result from this process are unintentional.

## 2015-10-13 ENCOUNTER — Other Ambulatory Visit: Payer: Self-pay | Admitting: Family Medicine

## 2015-10-13 NOTE — Telephone Encounter (Signed)
Medication called to pharmacy. 

## 2015-10-13 NOTE — Telephone Encounter (Signed)
Ok to refill??  Last office visit 09/22/2015.  Last refill 07/13/2015, #2 refills.

## 2015-10-13 NOTE — Telephone Encounter (Signed)
okay

## 2015-12-18 ENCOUNTER — Other Ambulatory Visit: Payer: Self-pay | Admitting: Family Medicine

## 2015-12-18 NOTE — Telephone Encounter (Signed)
okay

## 2015-12-18 NOTE — Telephone Encounter (Signed)
Ok to refill 

## 2016-01-13 ENCOUNTER — Other Ambulatory Visit: Payer: Self-pay | Admitting: Family Medicine

## 2016-01-13 ENCOUNTER — Encounter: Payer: Medicare Other | Admitting: Family Medicine

## 2016-01-13 NOTE — Telephone Encounter (Signed)
Ok to refill 

## 2016-01-13 NOTE — Telephone Encounter (Signed)
Approved. #[id number]. 

## 2016-01-13 NOTE — Telephone Encounter (Signed)
Medication called to pharmacy. 

## 2016-02-02 ENCOUNTER — Encounter: Payer: Medicare Other | Admitting: Family Medicine

## 2016-02-16 ENCOUNTER — Other Ambulatory Visit: Payer: Self-pay

## 2016-02-16 ENCOUNTER — Other Ambulatory Visit: Payer: Self-pay | Admitting: Physician Assistant

## 2016-02-16 NOTE — Telephone Encounter (Signed)
Okay to refill? 

## 2016-02-16 NOTE — Telephone Encounter (Signed)
Last OV 9-12 Last refill 1-3 Okay to refill?

## 2016-02-17 MED ORDER — DIAZEPAM 5 MG PO TABS: ORAL_TABLET | ORAL | 0 refills | Status: DC

## 2016-02-17 NOTE — Telephone Encounter (Signed)
Okay,

## 2016-02-17 NOTE — Telephone Encounter (Signed)
Rx was phoned in 2-7/ pt called and req refill as well

## 2016-02-17 NOTE — Telephone Encounter (Signed)
Rx phoned in to pharmacy. lvm for pt

## 2016-02-17 NOTE — Telephone Encounter (Signed)
Ok to refill 

## 2016-02-22 ENCOUNTER — Ambulatory Visit (INDEPENDENT_AMBULATORY_CARE_PROVIDER_SITE_OTHER): Payer: Medicare Other | Admitting: Family Medicine

## 2016-02-22 ENCOUNTER — Encounter: Payer: Self-pay | Admitting: Family Medicine

## 2016-02-22 VITALS — BP 118/62 | HR 84 | Temp 98.7°F | Resp 18 | Ht 74.0 in | Wt 212.0 lb

## 2016-02-22 DIAGNOSIS — Z125 Encounter for screening for malignant neoplasm of prostate: Secondary | ICD-10-CM | POA: Diagnosis not present

## 2016-02-22 DIAGNOSIS — M25562 Pain in left knee: Secondary | ICD-10-CM

## 2016-02-22 DIAGNOSIS — Z13220 Encounter for screening for lipoid disorders: Secondary | ICD-10-CM | POA: Diagnosis not present

## 2016-02-22 DIAGNOSIS — F418 Other specified anxiety disorders: Secondary | ICD-10-CM

## 2016-02-22 DIAGNOSIS — M25561 Pain in right knee: Secondary | ICD-10-CM

## 2016-02-22 DIAGNOSIS — R195 Other fecal abnormalities: Secondary | ICD-10-CM | POA: Diagnosis not present

## 2016-02-22 DIAGNOSIS — Z0000 Encounter for general adult medical examination without abnormal findings: Secondary | ICD-10-CM | POA: Diagnosis not present

## 2016-02-22 DIAGNOSIS — Z1211 Encounter for screening for malignant neoplasm of colon: Secondary | ICD-10-CM | POA: Diagnosis not present

## 2016-02-22 DIAGNOSIS — R6882 Decreased libido: Secondary | ICD-10-CM | POA: Diagnosis not present

## 2016-02-22 LAB — CBC WITH DIFFERENTIAL/PLATELET
Basophils Absolute: 77 cells/uL (ref 0–200)
Basophils Relative: 1 %
Eosinophils Absolute: 154 cells/uL (ref 15–500)
Eosinophils Relative: 2 %
HCT: 44.9 % (ref 38.5–50.0)
Hemoglobin: 14.9 g/dL (ref 13.0–17.0)
Lymphocytes Relative: 28 %
Lymphs Abs: 2156 cells/uL (ref 850–3900)
MCH: 29.6 pg (ref 27.0–33.0)
MCHC: 33.2 g/dL (ref 32.0–36.0)
MCV: 89.3 fL (ref 80.0–100.0)
MPV: 10.4 fL (ref 7.5–12.5)
Monocytes Absolute: 462 cells/uL (ref 200–950)
Monocytes Relative: 6 %
Neutro Abs: 4851 cells/uL (ref 1500–7800)
Neutrophils Relative %: 63 %
Platelets: 191 10*3/uL (ref 140–400)
RBC: 5.03 MIL/uL (ref 4.20–5.80)
RDW: 14 % (ref 11.0–15.0)
WBC: 7.7 10*3/uL (ref 3.8–10.8)

## 2016-02-22 LAB — COMPREHENSIVE METABOLIC PANEL
ALT: 22 U/L (ref 9–46)
AST: 19 U/L (ref 10–35)
Albumin: 4.2 g/dL (ref 3.6–5.1)
Alkaline Phosphatase: 92 U/L (ref 40–115)
BUN: 9 mg/dL (ref 7–25)
CO2: 25 mmol/L (ref 20–31)
Calcium: 9.1 mg/dL (ref 8.6–10.3)
Chloride: 105 mmol/L (ref 98–110)
Creat: 1.24 mg/dL (ref 0.70–1.33)
Glucose, Bld: 107 mg/dL — ABNORMAL HIGH (ref 70–99)
Potassium: 4 mmol/L (ref 3.5–5.3)
Sodium: 140 mmol/L (ref 135–146)
Total Bilirubin: 0.5 mg/dL (ref 0.2–1.2)
Total Protein: 6.8 g/dL (ref 6.1–8.1)

## 2016-02-22 LAB — LIPID PANEL
Cholesterol: 153 mg/dL (ref <200)
HDL: 30 mg/dL — ABNORMAL LOW (ref >40)
LDL CALC: 74 mg/dL (ref <100)
Total CHOL/HDL Ratio: 5.1 Ratio — ABNORMAL HIGH (ref <5.0)
Triglycerides: 247 mg/dL — ABNORMAL HIGH (ref <150)
VLDL: 49 mg/dL — ABNORMAL HIGH (ref <30)

## 2016-02-22 LAB — PSA: PSA: 1.3 ng/mL (ref <=4.0)

## 2016-02-22 MED ORDER — DIAZEPAM 5 MG PO TABS: ORAL_TABLET | ORAL | 1 refills | Status: DC

## 2016-02-22 MED ORDER — FLUOXETINE HCL 10 MG PO TABS: 10.0000 mg | ORAL_TABLET | Freq: Every day | ORAL | 3 refills | Status: DC

## 2016-02-22 NOTE — Patient Instructions (Addendum)
Referral for colon cancer screening Get xray of knees Referral to psychiatry for therapy  We will call with lab results F/U 6 months

## 2016-02-22 NOTE — Assessment & Plan Note (Signed)
Referral to psychiatry and therapy, grief and long standing depression contuinue prozac and valium

## 2016-02-22 NOTE — Progress Notes (Signed)
Subjective:   Patient presents for Medicare Annual/Subsequent preventive examination.   Medications reviewed   Depression with Anxiety- he is taking prozac and valium as prescribed, no concerns. Lost his cousin last month, son died last year.His wife is still living with him that he is concerned that she is back on drugs. Seems that there are many family members around him that are on drugs. He is also still upset that the cops will not do anything about his son's last year he feels guilt she states that people keep stating why didn't he intervene on his behavior. His son was 52 when he died.  Bilat  knee pain- feels like "arthritis" pain, right worse than left, tylenol as needed for pain, on and off worse pain 3 days, no swelling   His wife is concerned that he is not sexually intimate with her. He states that he thinks is because he is just not in the mood, but would like to get checked. Review Past Medical/Family/Social: Per EMR    Risk Factors  Current exercise habits: walks  Dietary issues discussed: none   Cardiac risk factors: None   Depression Screen - CURRENT TREATMENT  (Note: if answer to either of the following is "Yes", a more complete depression screening is indicated)  Over the past two weeks, have you felt down, depressed or hopeless? No Over the past two weeks, have you felt little interest or pleasure in doing things? No Have you lost interest or pleasure in daily life? No Do you often feel hopeless? No Do you cry easily over simple problems? No   Activities of Daily Living  In your present state of health, do you have any difficulty performing the following activities?:  Driving? No  Managing money? No  Feeding yourself? No  Getting from bed to chair? No  Climbing a flight of stairs? No  Preparing food and eating?: No  Bathing or showering? No  Getting dressed: No  Getting to the toilet? No  Using the toilet:No  Moving around from place to place: No  In the  past year have you fallen or had a near fall?:No  Are you sexually active? No  Do you have more than one partner? No   Hearing Difficulties: No  Do you often ask people to speak up or repeat themselves? Yes Do you experience ringing or noises in your ears? No Do you have difficulty understanding soft or whispered voices? No  Do you feel that you have a problem with memory? Yes Do you often misplace items? No  Do you feel safe at home? Yes  Cognitive Testing  Alert? Yes Normal Appearance?Yes  Oriented to person? Yes Place? Yes  Time? Yes  Recall of three objects? Yes  Can perform simple calculations? Yes  Displays appropriate judgment?Yes  Can read the correct time from a watch face?Yes   List the Names of Other Physician/Practitioners you currently use:  None currently   Screening Tests / Date Colonoscopy         DUE             Zostavax  AGE 51   Influenza Vaccine Declines  Tetanus/tdap- Unable to afford   ROS: GEN- denies fatigue, fever, weight loss,weakness, recent illness HEENT- denies eye drainage, change in vision, nasal discharge, CVS- denies chest pain, palpitations RESP- denies SOB, cough, wheeze ABD- denies N/V, change in stools, abd pain GU- denies dysuria, hematuria, dribbling, incontinence MSK- + joint pain, muscle aches, injury Neuro- denies headache,  dizziness, syncope, seizure activity  Physical: GEN- NAD, alert and oriented x3 HEENT- PERRL, EOMI, non injected sclera, pink conjunctiva, MMM, oropharynx clear Neck- Supple, no thryomegaly CVS- RRR, no murmur RESP-CTAB ABD-NABS,soft,NT,ND  Psych- depressed flat affect, no SI, no hallucinations normal speech  GU- rectum- normal tone, soft stool in vault,FOBT neg, prostate smooth, no nodules palpated - difficult exam due to clenching  MSK- Bilat knee- no effusion, fair ROM, no crepitus, ligaments in tact  EXT- No edema Pulses- Radial  2+    Assessment:    Annual wellness medicare exam   Plan:     During the course of the visit the patient was educated and counseled about appropriate screening and preventive services including:    Colorectal cancer screening needed- FOBT positive   Screening cholesterol and labs to be done   For decreased libido- chest testosterone/PSA for prostate cancer screen  Bilat knee pain- may have some OA, check xrays of knees, takes tylenol as needed  Depression/anxiety- I think he would benefit from counseling, has significant stressors and grief. He agrees to this. His depressed mood and  Affect has been the same since I first met him > 3 years ago. No SI noted   Diet review for nutrition referral? Yes ____ Not Indicated __x__  Patient Instructions (the written plan) was given to the patient.  Medicare Attestation  I have personally reviewed:  The patient's medical and social history  Their use of alcohol, tobacco or illicit drugs  Their current medications and supplements  The patient's functional ability including ADLs,fall risks, home safety risks, cognitive, and hearing and visual impairment  Diet and physical activities  Evidence for depression or mood disorders  The patient's weight, height, BMI, and visual acuity have been recorded in the chart. I have made referrals, counseling, and provided education to the patient based on review of the above and I have provided the patient with a written personalized care plan for preventive services.

## 2016-02-23 LAB — TESTOSTERONE: TESTOSTERONE: 334 ng/dL (ref 250–827)

## 2016-02-26 ENCOUNTER — Encounter (INDEPENDENT_AMBULATORY_CARE_PROVIDER_SITE_OTHER): Payer: Self-pay | Admitting: Internal Medicine

## 2016-02-29 ENCOUNTER — Telehealth (HOSPITAL_COMMUNITY): Payer: Self-pay | Admitting: *Deleted

## 2016-02-29 NOTE — Telephone Encounter (Signed)
left voice message regarding an appointment. 

## 2016-03-14 ENCOUNTER — Ambulatory Visit (INDEPENDENT_AMBULATORY_CARE_PROVIDER_SITE_OTHER): Payer: Medicare Other | Admitting: Internal Medicine

## 2016-03-14 ENCOUNTER — Encounter (INDEPENDENT_AMBULATORY_CARE_PROVIDER_SITE_OTHER): Payer: Self-pay | Admitting: Internal Medicine

## 2016-03-31 ENCOUNTER — Telehealth (HOSPITAL_COMMUNITY): Payer: Self-pay | Admitting: *Deleted

## 2016-03-31 NOTE — Telephone Encounter (Signed)
phone call spoke with patient, he said when he has a problem he goes to the [facility].   He said he don't know why the doctor want to refer him to a place like that.  He said he is going to visit his brother and spend some time with him.

## 2016-05-13 ENCOUNTER — Other Ambulatory Visit: Payer: Self-pay | Admitting: Family Medicine

## 2016-05-13 NOTE — Telephone Encounter (Signed)
okay

## 2016-05-13 NOTE — Telephone Encounter (Signed)
Medication called to pharmacy. 

## 2016-05-13 NOTE — Telephone Encounter (Signed)
Ok to refill??  Last office visit/ refill 02/22/2016, #1 refill.

## 2016-07-18 ENCOUNTER — Telehealth: Payer: Self-pay | Admitting: *Deleted

## 2016-07-18 MED ORDER — DIAZEPAM 5 MG PO TABS: ORAL_TABLET | ORAL | 1 refills | Status: DC

## 2016-07-18 NOTE — Telephone Encounter (Signed)
Medication called to pharmacy. 

## 2016-07-18 NOTE — Telephone Encounter (Signed)
ok 

## 2016-07-18 NOTE — Telephone Encounter (Signed)
Received call from patient. Requested refill on Diazepam.   Ok to refill??  Last office visit 02/22/2016.  Last refill 05/13/2016, #1 refills.

## 2016-08-22 ENCOUNTER — Ambulatory Visit: Payer: Medicare Other | Admitting: Family Medicine

## 2016-08-26 ENCOUNTER — Encounter: Payer: Self-pay | Admitting: Family Medicine

## 2016-08-26 ENCOUNTER — Ambulatory Visit (INDEPENDENT_AMBULATORY_CARE_PROVIDER_SITE_OTHER): Payer: Medicare Other | Admitting: Family Medicine

## 2016-08-26 VITALS — BP 108/80 | HR 73 | Resp 15 | Ht 74.0 in | Wt 200.4 lb

## 2016-08-26 DIAGNOSIS — E781 Pure hyperglyceridemia: Secondary | ICD-10-CM | POA: Diagnosis not present

## 2016-08-26 DIAGNOSIS — M5030 Other cervical disc degeneration, unspecified cervical region: Secondary | ICD-10-CM | POA: Diagnosis not present

## 2016-08-26 DIAGNOSIS — F418 Other specified anxiety disorders: Secondary | ICD-10-CM | POA: Diagnosis not present

## 2016-08-26 DIAGNOSIS — M5136 Other intervertebral disc degeneration, lumbar region: Secondary | ICD-10-CM

## 2016-08-26 DIAGNOSIS — Z113 Encounter for screening for infections with a predominantly sexual mode of transmission: Secondary | ICD-10-CM

## 2016-08-26 MED ORDER — NAPROXEN 500 MG PO TABS: 500.0000 mg | ORAL_TABLET | Freq: Two times a day (BID) | ORAL | 3 refills | Status: DC

## 2016-08-26 NOTE — Progress Notes (Signed)
Subjective:    Patient ID: Jacob Reed, male    DOB: 1965-05-11, 51 y.o.   MRN: [id number]  Patient presents for Neck Pain (x 4 days. Hurts more with movement. Wants to get an aide through [facility]) and Back Pain   Pt here to f/u chronic medical problems and medications. He is now separted from his wife, he was evicted from his house, now lives in an apartment.  MDD with anxiety - was prescribed prozac and valium , he stopped the prozac made him dizzy, now only taking Valium   TG were elevated at Physical - due for repeat cholesteol , he had eaten before last visit    Has DDD in neck and spine, no recent injury, denies any tingling numbness in hands, feet, no change in bowel or bladder, takes tylenol   At the end of the visit he asked  for complete STD screening  Review Of Systems:  GEN- denies fatigue, fever, weight loss,weakness, recent illness HEENT- denies eye drainage, change in vision, nasal discharge, CVS- denies chest pain, palpitations RESP- denies SOB, cough, wheeze ABD- denies N/V, change in stools, abd pain GU- denies dysuria, hematuria, dribbling, incontinence MSK- +oint pain, muscle aches, injury Neuro- denies headache, dizziness, syncope, seizure activity       Objective:    BP 108/80   Pulse 73   Resp 15   Ht 6\' 2"  (1.88 m)   Wt 200 lb 6.4 oz (90.9 kg)   SpO2 98%   BMI 25.73 kg/m  GEN- NAD, alert and oriented x3 HEENT- PERRL, EOMI, non injected sclera, pink conjunctiva, MMM, oropharynx clear Neck- Supple,fair ROM, neg spurlings C spine NT  CVS- RRR, no murmur RESP-CTAB MSK- lumbar spine NT, fair ROM, neg SLR, good ROM HIPS/knees NEURO Strength equal bilat Upper and lower ext, tone norma,sensation in tact  Psych- his normal flat affect, not anxious appearing, no halluciantions, pleasant  EXT- No edema Pulses- Radial, - 2+        Assessment & Plan:    He does not qualify for any CNA/nurse aide, discussed this with him    Problem List  Items Addressed This Visit      Unprioritized   DDD (degenerative disc disease), lumbar   Relevant Medications   naproxen (NAPROSYN) 500 MG tablet   Depression with anxiety - Primary    He does not want to go back on any other mood medications. He is still taking his Valium I did discuss with him that he can go to the local mental health facility at  [facility]  he states that he will think about it.      DDD (degenerative disc disease), cervical    Chronic neck and back pain nothing particular hurting at this time. States that he had a knot that is now resolved. His x-rays that showed mild degenerative disc disease in the past no red flags on exam. I given him some Naprosyn to use as needed.      Relevant Medications   naproxen (NAPROSYN) 500 MG tablet    Other Visit Diagnoses    Hypertriglyceridemia       Relevant Orders   Lipid panel   Screen for STD (sexually transmitted disease)       STD screen at pt request, repeat in 6 months   Relevant Orders   HIV antibody   RPR   HSV(herpes simplex vrs) 1+2 ab-IgG   HSV(herpes simplex vrs) 1+2 ab-IgM   GC/Chlamydia Probe Amp  Note: This dictation was prepared with Dragon dictation along with smaller phrase technology. Any transcriptional errors that result from this process are unintentional.

## 2016-08-26 NOTE — Assessment & Plan Note (Signed)
He does not want to go back on any other mood medications. He is still taking his Valium I did discuss with him that he can go to the local mental health facility at  [facility]  he states that he will think about it.

## 2016-08-26 NOTE — Assessment & Plan Note (Signed)
Chronic neck and back pain nothing particular hurting at this time. States that he had a knot that is now resolved. His x-rays that showed mild degenerative disc disease in the past no red flags on exam. I given him some Naprosyn to use as needed.

## 2016-08-26 NOTE — Patient Instructions (Signed)
Take arthritis medication You can walk into [facility] for mental health We will call with lab results F/U 6 months Physical

## 2016-08-27 LAB — LIPID PANEL
CHOL/HDL RATIO: 4.2 ratio (ref <5.0)
CHOLESTEROL: 158 mg/dL (ref <200)
HDL: 38 mg/dL — ABNORMAL LOW (ref >40)
LDL CALC: 97 mg/dL (ref <100)
TRIGLYCERIDES: 117 mg/dL (ref <150)
VLDL: 23 mg/dL (ref <30)

## 2016-08-27 LAB — HIV ANTIBODY (ROUTINE TESTING W REFLEX): HIV 1&2 Ab, 4th Generation: NONREACTIVE

## 2016-08-27 LAB — GC/CHLAMYDIA PROBE AMP
CT Probe RNA: NOT DETECTED
GC Probe RNA: NOT DETECTED

## 2016-08-27 LAB — RPR

## 2016-08-29 LAB — HSV(HERPES SIMPLEX VRS) I + II AB-IGG
HSV 1 Glycoprotein G Ab, IgG: 19.8 Index — ABNORMAL HIGH (ref <0.90)
HSV 2 Glycoprotein G Ab, IgG: <0.9 Index (ref <0.90)

## 2016-09-01 LAB — HSV 1/2 AB (IGM), IFA W/RFLX TITER
HSV 1 IgM Screen: NEGATIVE
HSV 2 IgM Screen: NEGATIVE

## 2016-09-16 ENCOUNTER — Telehealth: Payer: Self-pay | Admitting: *Deleted

## 2016-09-16 MED ORDER — DIAZEPAM 5 MG PO TABS: ORAL_TABLET | ORAL | 1 refills | Status: DC

## 2016-09-16 NOTE — Telephone Encounter (Signed)
Okay to refill? 

## 2016-09-16 NOTE — Telephone Encounter (Signed)
Medication called to pharmacy. 

## 2016-09-16 NOTE — Telephone Encounter (Signed)
Received fax requesting refill on Diazepam.   Ok to refill??  Last office visit 08/26/2016.  Last refill 07/18/2016, #1 refill.

## 2016-10-14 ENCOUNTER — Telehealth: Payer: Self-pay | Admitting: Family Medicine

## 2016-10-14 MED ORDER — VALACYCLOVIR HCL 1 G PO TABS: 1000.0000 mg | ORAL_TABLET | Freq: Two times a day (BID) | ORAL | 0 refills | Status: DC

## 2016-10-14 NOTE — Telephone Encounter (Signed)
Okay to refill? 

## 2016-10-14 NOTE — Telephone Encounter (Signed)
Pt calling wanting prescription for herpes, he is currently having an outbreak, Psychiatrist.

## 2016-10-14 NOTE — Telephone Encounter (Signed)
Prescription sent to pharmacy.

## 2016-10-14 NOTE — Telephone Encounter (Signed)
Ok to send Valtrex? 

## 2016-11-02 ENCOUNTER — Emergency Department (HOSPITAL_COMMUNITY)
Admission: EM | Admit: 2016-11-02 | Discharge: 2016-11-02 | Disposition: A | Discharge status: Home / Self Care | Payer: Medicare Other | Attending: Emergency Medicine | Admitting: Emergency Medicine

## 2016-11-02 ENCOUNTER — Emergency Department (HOSPITAL_COMMUNITY): Payer: Medicare Other

## 2016-11-02 ENCOUNTER — Encounter (HOSPITAL_COMMUNITY): Payer: Self-pay | Admitting: Cardiology

## 2016-11-02 DIAGNOSIS — J40 Bronchitis, not specified as acute or chronic: Secondary | ICD-10-CM | POA: Insufficient documentation

## 2016-11-02 DIAGNOSIS — Z79899 Other long term (current) drug therapy: Secondary | ICD-10-CM | POA: Insufficient documentation

## 2016-11-02 DIAGNOSIS — R5383 Other fatigue: Secondary | ICD-10-CM | POA: Diagnosis not present

## 2016-11-02 DIAGNOSIS — Z87891 Personal history of nicotine dependence: Secondary | ICD-10-CM | POA: Insufficient documentation

## 2016-11-02 DIAGNOSIS — R05 Cough: Secondary | ICD-10-CM | POA: Diagnosis not present

## 2016-11-02 HISTORY — DX: Dorsalgia, unspecified: M54.2

## 2016-11-02 HISTORY — DX: Dorsalgia, unspecified: M54.9

## 2016-11-02 HISTORY — DX: Other chronic pain: G89.29

## 2016-11-02 LAB — COMPREHENSIVE METABOLIC PANEL
ALK PHOS: 91 U/L (ref 38–126)
ALT: 16 U/L — AB (ref 17–63)
AST: 22 U/L (ref 15–41)
Albumin: 4.3 g/dL (ref 3.5–5.0)
Anion gap: 8 (ref 5–15)
BUN: 14 mg/dL (ref 6–20)
CALCIUM: 9.1 mg/dL (ref 8.9–10.3)
CO2: 24 mmol/L (ref 22–32)
CREATININE: 1.22 mg/dL (ref 0.61–1.24)
Chloride: 108 mmol/L (ref 101–111)
Glucose, Bld: 88 mg/dL (ref 65–99)
Potassium: 3.9 mmol/L (ref 3.5–5.1)
Sodium: 140 mmol/L (ref 135–145)
Total Bilirubin: 0.4 mg/dL (ref 0.3–1.2)
Total Protein: 7.3 g/dL (ref 6.5–8.1)

## 2016-11-02 LAB — CBC WITH DIFFERENTIAL/PLATELET
BASOS PCT: 0 %
Basophils Absolute: 0 10*3/uL (ref 0.0–0.1)
Eosinophils Absolute: 0 10*3/uL (ref 0.0–0.7)
Eosinophils Relative: 0 %
HCT: 43.8 % (ref 39.0–52.0)
HEMOGLOBIN: 14.9 g/dL (ref 13.0–17.0)
LYMPHS ABS: 1.8 10*3/uL (ref 0.7–4.0)
Lymphocytes Relative: 16 %
MCH: 30.3 pg (ref 26.0–34.0)
MCHC: 34 g/dL (ref 30.0–36.0)
MCV: 89.2 fL (ref 78.0–100.0)
MONOS PCT: 5 %
Monocytes Absolute: 0.6 10*3/uL (ref 0.1–1.0)
NEUTROS PCT: 79 %
Neutro Abs: 9 10*3/uL — ABNORMAL HIGH (ref 1.7–7.7)
Platelets: 176 10*3/uL (ref 150–400)
RBC: 4.91 MIL/uL (ref 4.22–5.81)
RDW: 13.2 % (ref 11.5–15.5)
WBC: 11.4 10*3/uL — AB (ref 4.0–10.5)

## 2016-11-02 LAB — URINALYSIS, ROUTINE W REFLEX MICROSCOPIC
BILIRUBIN URINE: NEGATIVE
GLUCOSE, UA: NEGATIVE mg/dL
HGB URINE DIPSTICK: NEGATIVE
KETONES UR: NEGATIVE mg/dL
LEUKOCYTES UA: NEGATIVE
Nitrite: NEGATIVE
PH: 6 (ref 5.0–8.0)
PROTEIN: NEGATIVE mg/dL
Specific Gravity, Urine: 1.026 (ref 1.005–1.030)

## 2016-11-02 LAB — TROPONIN I

## 2016-11-02 LAB — LACTIC ACID, PLASMA: LACTIC ACID, VENOUS: 1.4 mmol/L (ref 0.5–1.9)

## 2016-11-02 MED ORDER — ALBUTEROL SULFATE HFA 108 (90 BASE) MCG/ACT IN AERS: 2.0000 | INHALATION_SPRAY | RESPIRATORY_TRACT | 0 refills | Status: DC | PRN

## 2016-11-02 NOTE — ED Triage Notes (Signed)
C/o "no energy " times 4 days.  Decreased appetite.

## 2016-11-02 NOTE — ED Notes (Signed)
Patient transported to X-ray 

## 2016-11-02 NOTE — ED Notes (Signed)
MD at bedside. 

## 2016-11-02 NOTE — ED Notes (Signed)
Pt is very somber upon assessment. States to only being fatigued for the last 4 days and talks about his home life and ex wife as well.

## 2016-11-02 NOTE — Discharge Instructions (Signed)
Take the prescription as directed. Increase your fluids for the next several days.  Use the albuterol inhaler (2 to 4 puffs) every 4 hours for the next 7 days, then as needed for cough, wheezing, or shortness of breath.  Call your regular medical doctor tomorrow morning to schedule a follow up appointment within the next 3 days.  Return to the Emergency Department immediately sooner if worsening.

## 2016-11-02 NOTE — ED Provider Notes (Signed)
[facility] EMERGENCY DEPARTMENT Provider Note   CSN: [id number] Arrival date & time: 11/02/16  1622     History   Chief Complaint Chief Complaint  Patient presents with  . Fatigue    HPI Jacob Reed is a 51 y.o. male.  HPI  Pt was seen at 1730.  Per pt, c/o gradual onset and persistence of constant generalized fatigue for the past 4 days. States he has "had no energy" and decreased appetite. Denies any other symptoms. Denies CP/SOB, no cough, no abd pain, no N/V/D, no back pain, no fevers, no rash, no sore throat.   Past Medical History:  Diagnosis Date  . Anxiety   . Arthritis   . Chronic neck and back pain   . Dental caries   . Sinusitis     Patient Active Problem List   Diagnosis Date Noted  . DDD (degenerative disc disease), lumbar 09/22/2015  . DDD (degenerative disc disease), cervical 09/22/2015  . Insomnia 06/29/2013  . Tinnitus 06/26/2013  . Dizziness and giddiness 04/24/2013  . External hemorrhoid 04/24/2013  . Routine general medical examination at a health care facility 04/24/2013  . Depression with anxiety 11/13/2011  . Arthritis 11/13/2011  . Seasonal allergies 11/13/2011    Past Surgical History:  Procedure Laterality Date  . INGUINAL HERNIA REPAIR Right 10/01/2012   Procedure: HERNIA REPAIR INGUINAL ADULT;  Surgeon: Scherry Ran, MD;  Location: [facility];  Service: General;  Laterality: Right;  . TOOTH EXTRACTION    . VASECTOMY         Home Medications    Prior to Admission medications   Medication Sig Start Date End Date Taking? Authorizing Provider  acetaminophen (TYLENOL) 500 MG tablet Take 250 mg by mouth daily as needed for moderate pain.   Yes [provider]  diazepam (VALIUM) 5 MG tablet TAKE 1 TABLET BY MOUTH 2 TIMES DAILY AS NEEDED FOR ANXIETY. MUST LAST30 DAYS. 09/16/16  Yes Chester, Modena Nunnery, MD  naproxen (NAPROSYN) 500 MG tablet Take 1 tablet (500 mg total) by mouth 2 (two) times daily with a meal. As needed for  pain Patient not taking: Reported on 11/02/2016 08/26/16   Alycia Rossetti, MD  valACYclovir (VALTREX) 1000 MG tablet Take 1 tablet (1,000 mg total) by mouth 2 (two) times daily. Patient not taking: Reported on 11/02/2016 10/14/16   Alycia Rossetti, MD    Family History Family History  Problem Relation Age of Onset  . Heart disease Father     Social History Social History  Substance Use Topics  . Smoking status: Former Research scientist (life sciences)  . Smokeless tobacco: Current User    Types: Snuff  . Alcohol use No     Allergies   Tramadol   Review of Systems Review of Systems ROS: Statement: All systems negative except as marked or noted in the HPI; Constitutional: Negative for fever and chills. +generalized fatigue.; ; Eyes: Negative for eye pain, redness and discharge. ; ; ENMT: Negative for ear pain, hoarseness, nasal congestion, sinus pressure and sore throat. ; ; Cardiovascular: Negative for chest pain, palpitations, diaphoresis, dyspnea and peripheral edema. ; ; Respiratory: Negative for cough, wheezing and stridor. ; ; Gastrointestinal: Negative for nausea, vomiting, diarrhea, abdominal pain, blood in stool, hematemesis, jaundice and rectal bleeding. . ; ; Genitourinary: Negative for dysuria, flank pain and hematuria. ; ; Musculoskeletal: Negative for back pain and neck pain. Negative for swelling and trauma.; ; Skin: Negative for pruritus, rash, abrasions, blisters, bruising and skin lesion.; ;  Neuro: Negative for headache, lightheadedness and neck stiffness. Negative for altered level of consciousness, altered mental status, extremity weakness, paresthesias, involuntary movement, seizure and syncope.       Physical Exam Updated Vital Signs BP 124/85 (BP Location: Left Arm)   Pulse 85   Temp 98.4 F (36.9 C) (Oral)   Resp (!) 45   Ht 6\' 2"  (1.88 m)   Wt 82.6 kg (182 lb)   SpO2 98%   BMI 23.37 kg/m   Physical Exam 1735: Physical examination:  Nursing notes reviewed; Vital signs  and O2 SAT reviewed;  Constitutional: Well developed, Well nourished, Well hydrated, In no acute distress; Head:  Normocephalic, atraumatic; Eyes: EOMI, PERRL, No scleral icterus; ENMT: TM's clear bilat. +edemetous nasal turbinates bilat with clear rhinorrhea. Mouth and pharynx without lesions. No tonsillar exudates. No intra-oral edema. No submandibular or sublingual edema. No hoarse voice, no drooling, no stridor. No pain with manipulation of larynx. No trismus.  Mouth and pharynx normal, Mucous membranes moist; Neck: Supple, Full range of motion, No lymphadenopathy; Cardiovascular: Regular rate and rhythm, No gallop; Respiratory: Breath sounds clear & equal bilaterally, No wheezes.  Speaking full sentences with ease, Normal respiratory effort/excursion; Chest: Nontender, Movement normal; Abdomen: Soft, Nontender, Nondistended, Normal bowel sounds; Genitourinary: No CVA tenderness; Extremities: Pulses normal, No tenderness, No edema, No calf edema or asymmetry.; Neuro: AA&Ox3, Major CN grossly intact.  Speech clear. No gross focal motor or sensory deficits in extremities.; Skin: Color normal, Warm, Dry.; Psych:  Affect flat, poor eye contact.    ED Treatments / Results  Labs (all labs ordered are listed, but only abnormal results are displayed)   EKG  EKG Interpretation  Date/Time:  Wednesday November 02 2016 17:58:22 EDT Ventricular Rate:  83 PR Interval:    QRS Duration: 86 QT Interval:  334 QTC Calculation: 393 R Axis:   -14 Text Interpretation:  Sinus rhythm Low voltage, precordial leads Abnormal R-wave progression, early transition When compared with ECG of 06/10/2013 No significant change was found Confirmed by Francine Graven [phone]) on 11/02/2016 6:38:40 PM       Radiology   Procedures Procedures (including critical care time)  Medications Ordered in ED Medications - No data to display   Initial Impression / Assessment and Plan / ED Course  I have reviewed the triage vital  signs and the nursing notes.  Pertinent labs & imaging results that were available during my care of the patient were reviewed by me and considered in my medical decision making (see chart for details).  MDM Reviewed: previous chart, nursing note and vitals Reviewed previous: labs and ECG Interpretation: labs, ECG and x-ray   Results for orders placed or performed during the hospital encounter of 11/02/16  Comprehensive metabolic panel  Result Value Ref Range   Sodium 140 135 - 145 mmol/L   Potassium 3.9 3.5 - 5.1 mmol/L   Chloride 108 101 - 111 mmol/L   CO2 24 22 - 32 mmol/L   Glucose, Bld 88 65 - 99 mg/dL   BUN 14 6 - 20 mg/dL   Creatinine, Ser 1.22 0.61 - 1.24 mg/dL   Calcium 9.1 8.9 - 10.3 mg/dL   Total Protein 7.3 6.5 - 8.1 g/dL   Albumin 4.3 3.5 - 5.0 g/dL   AST 22 15 - 41 U/L   ALT 16 (L) 17 - 63 U/L   Alkaline Phosphatase 91 38 - 126 U/L   Total Bilirubin 0.4 0.3 - 1.2 mg/dL   GFR calc non  Af Amer >60 >60 mL/min   GFR calc Af Amer >60 >60 mL/min   Anion gap 8 5 - 15  CBC with Differential  Result Value Ref Range   WBC 11.4 (H) 4.0 - 10.5 K/uL   RBC 4.91 4.22 - 5.81 MIL/uL   Hemoglobin 14.9 13.0 - 17.0 g/dL   HCT 43.8 39.0 - 52.0 %   MCV 89.2 78.0 - 100.0 fL   MCH 30.3 26.0 - 34.0 pg   MCHC 34.0 30.0 - 36.0 g/dL   RDW 13.2 11.5 - 15.5 %   Platelets 176 150 - 400 K/uL   Neutrophils Relative % 79 %   Neutro Abs 9.0 (H) 1.7 - 7.7 K/uL   Lymphocytes Relative 16 %   Lymphs Abs 1.8 0.7 - 4.0 K/uL   Monocytes Relative 5 %   Monocytes Absolute 0.6 0.1 - 1.0 K/uL   Eosinophils Relative 0 %   Eosinophils Absolute 0.0 0.0 - 0.7 K/uL   Basophils Relative 0 %   Basophils Absolute 0.0 0.0 - 0.1 K/uL  Lactic acid, plasma  Result Value Ref Range   Lactic Acid, Venous 1.4 0.5 - 1.9 mmol/L  Troponin I  Result Value Ref Range   Troponin I <0.03 <0.03 ng/mL  Urinalysis, Routine w reflex microscopic  Result Value Ref Range   Color, Urine YELLOW YELLOW   APPearance CLEAR  CLEAR   Specific Gravity, Urine 1.026 1.005 - 1.030   pH 6.0 5.0 - 8.0   Glucose, UA NEGATIVE NEGATIVE mg/dL   Hgb urine dipstick NEGATIVE NEGATIVE   Bilirubin Urine NEGATIVE NEGATIVE   Ketones, ur NEGATIVE NEGATIVE mg/dL   Protein, ur NEGATIVE NEGATIVE mg/dL   Nitrite NEGATIVE NEGATIVE   Leukocytes, UA NEGATIVE NEGATIVE   Dg Chest 2 View Result Date: 11/02/2016 CLINICAL DATA:  Fatigue with dry cough EXAM: CHEST  2 VIEW COMPARISON:  05/28/2014 FINDINGS: Mild central airways thickening. No consolidation or effusion. Normal heart size. No pneumothorax. Minimal atherosclerosis. IMPRESSION: Mild central airways thickening suggesting bronchitis. No focal pneumonia Electronically Signed   By: Donavan Foil M.D.   On: 11/02/2016 18:29    2000:  Pt denies any specific complaint other than fatigue. Workup reassuring. Affect flat; denies SI. Tx symptomatically at this time. Dx and testing d/w pt.  Questions answered.  Verb understanding, agreeable to d/c home with outpt f/u.    Final Clinical Impressions(s) / ED Diagnoses   Final diagnoses:  None    New Prescriptions New Prescriptions   No medications on file     Francine Graven, DO 11/05/16 [id number]

## 2016-11-11 ENCOUNTER — Ambulatory Visit (INDEPENDENT_AMBULATORY_CARE_PROVIDER_SITE_OTHER): Payer: Medicare Other | Admitting: Family Medicine

## 2016-11-11 ENCOUNTER — Encounter: Payer: Self-pay | Admitting: Family Medicine

## 2016-11-11 VITALS — BP 110/78 | HR 90 | Temp 98.6°F | Resp 14 | Ht 74.0 in | Wt 186.0 lb

## 2016-11-11 DIAGNOSIS — B009 Herpesviral infection, unspecified: Secondary | ICD-10-CM | POA: Diagnosis not present

## 2016-11-11 DIAGNOSIS — F418 Other specified anxiety disorders: Secondary | ICD-10-CM

## 2016-11-11 MED ORDER — VALACYCLOVIR HCL 500 MG PO TABS
500.0000 mg | ORAL_TABLET | Freq: Every day | ORAL | 6 refills | Status: DC
Start: 2016-11-11 — End: 2017-07-26

## 2016-11-11 NOTE — Assessment & Plan Note (Addendum)
I think his depression his anxiety is causing a lot of his fatigue he worries about everything he has a very stressful home life and past.  He has had issues with his children and his ex-wife ongoing for years.  Now he has this new young girlfriend which he pins points that she wants to be more sexually active than he does.  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  Try to reiterate that this is not going to cause death but can not be cured   He still declines any therapy, declines anti-depressants, he has had some weight fluctuations.  Cancer screening UTD except colonoscopy, he declines this

## 2016-11-11 NOTE — Patient Instructions (Addendum)
Take the valtrex once a day  Eat more protein  F/U 4 months for PHYSICAL

## 2016-11-11 NOTE — Progress Notes (Signed)
   Subjective:    Patient ID: Jacob Reed, male    DOB: March 27, 1965, 51 y.o.   MRN: [id number]  Patient presents for ER F/U (Bronchitis) and Discuss HSV 1   +HSV on labs done in August, had GF at that time , he did not understand how he got herpes he now remembers that his ex-wife had fever blisters all the time.  He has never had any breakouts in his genital region.  He states that his whole family was distressed because he was given a diagnosis of herpes.     Seen at ED on 10/24 for fatigue and no energy, labs done which were unremarkable  He had CXR that said mild bronchitis he is not have any significant cough at this time.  If he has been under significant stress his wife needs to be committed because they are not legally divorced he states that [facility] office as well as some family friends have asked him to come and sign commitment papers for her again.  He states that she has been committed multiple times.  She is also causing distress to their older child who is currently in college.  He admits that he is not sleeping very well.  He does not like the demands of his current girlfriend but does not want in the relationship.  He declines being on antidepressant medication or getting therapy.   Review Of Systems:  GEN- denies fatigue, fever, weight loss,weakness, recent illness HEENT- denies eye drainage, change in vision, nasal discharge, CVS- denies chest pain, palpitations RESP- denies SOB, cough, wheeze ABD- denies N/V, change in stools, abd pain GU- denies dysuria, hematuria, dribbling, incontinence MSK- denies joint pain, muscle aches, injury Neuro- denies headache, dizziness, syncope, seizure activity       Objective:    BP 110/78   Pulse 90   Temp 98.6 F (37 C) (Oral)   Resp 14   Ht 6\' 2"  (1.88 m)   Wt 186 lb (84.4 kg)   SpO2 96%   BMI 23.88 kg/m  GEN- NAD, alert and oriented x3 HEENT- PERRL, EOMI, non injected sclera, pink conjunctiva, MMM, oropharynx clear CVS-  RRR, no murmur RESP-CTAB Psych- depressed affect and mood, not anxuous , no SI, well groomed Pulses- Radial 2+        Assessment & Plan:      Problem List Items Addressed This Visit      Unprioritized   Herpes simplex type 1 antibody positive   Depression with anxiety - Primary    I think his depression his anxiety is causing a lot of his fatigue he worries about everything he has a very stressful home life and past.  He has had issues with his children and his ex-wife ongoing for years.  Now he has this new young girlfriend which he pins points that she wants to be more sexually active than he does.  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  Try to reiterate that this is not going to cause death but can not be cured   He still declines any therapy, declines anti-depressants, he has had some weight fluctuations.  Cancer screening UTD except colonoscopy, he declines this         Note: This dictation was prepared with Dragon dictation along with smaller phrase technology. Any transcriptional errors that result from this process are unintentional.

## 2016-11-15 ENCOUNTER — Telehealth: Payer: Self-pay | Admitting: Family Medicine

## 2016-11-15 MED ORDER — DIAZEPAM 5 MG PO TABS: ORAL_TABLET | ORAL | 1 refills | Status: DC

## 2016-11-15 NOTE — Telephone Encounter (Signed)
OKAY TO REFILL?

## 2016-11-15 NOTE — Telephone Encounter (Signed)
Medication called to pharmacy. 

## 2016-11-15 NOTE — Telephone Encounter (Signed)
Ok to refill??  Last office visit 11/11/2016.  Last refill 09/16/2016, #1 refill.

## 2016-11-15 NOTE — Telephone Encounter (Signed)
Patient needs refill on his valium  [facility]  [phone] if any questions

## 2017-01-16 ENCOUNTER — Encounter: Payer: Self-pay | Admitting: Family Medicine

## 2017-01-16 ENCOUNTER — Ambulatory Visit (INDEPENDENT_AMBULATORY_CARE_PROVIDER_SITE_OTHER): Payer: Medicare Other | Admitting: Family Medicine

## 2017-01-16 ENCOUNTER — Other Ambulatory Visit: Payer: Self-pay

## 2017-01-16 VITALS — BP 138/86 | HR 82 | Temp 97.9°F | Resp 14 | Ht 74.0 in | Wt 173.0 lb

## 2017-01-16 DIAGNOSIS — Z9852 Vasectomy status: Secondary | ICD-10-CM

## 2017-01-16 DIAGNOSIS — B888 Other specified infestations: Secondary | ICD-10-CM

## 2017-01-16 DIAGNOSIS — Z0189 Encounter for other specified special examinations: Secondary | ICD-10-CM | POA: Diagnosis not present

## 2017-01-16 DIAGNOSIS — Z3141 Encounter for fertility testing: Secondary | ICD-10-CM

## 2017-01-16 MED ORDER — DIAZEPAM 5 MG PO TABS: ORAL_TABLET | ORAL | 1 refills | Status: DC

## 2017-01-16 MED ORDER — PERMETHRIN 5 % EX CREA: 1.0000 "application " | TOPICAL_CREAM | Freq: Once | CUTANEOUS | 1 refills | Status: AC

## 2017-01-16 MED ORDER — TRIAMCINOLONE ACETONIDE 0.1 % EX CREA: 1.0000 "application " | TOPICAL_CREAM | Freq: Two times a day (BID) | CUTANEOUS | 0 refills | Status: DC

## 2017-01-16 NOTE — Patient Instructions (Signed)
F/U as previous Referral to urology

## 2017-01-16 NOTE — Progress Notes (Signed)
   Subjective:    Patient ID: Jacob Reed, male    DOB: [birth year], 52 y.o.   MRN: [id number]  Patient presents for Referral (wants to see urology to have sperm count checked- states that he is eing accused of fathering another child) and Rash (has skin irritation to lower legs/ ankles- thinks it's bed bugs)  Patient here for referral to urology.  States that he had a vasectomy greater than 20 years ago but a young lady that he had been intimate with has been telling people in the community that she is pregnant with his child.  He did not think that he could have a child.  He does not want to wait until it is born to see if it is his as she will likely be incarcerated during this time.  He also has rash on his lower extremities states that he has bedbugs in his home he has had the exterminator out a few times but things are still biting him all over his legs.  He is not use any over-the-counter medication.  States that his apartment has been infested before with other bugs.   Review Of Systems:  GEN- denies fatigue, fever, weight loss,weakness, recent illness HEENT- denies eye drainage, change in vision, nasal discharge, CVS- denies chest pain, palpitations RESP- denies SOB, cough, wheeze ABD- denies N/V, change in stools, abd pain GU- denies dysuria, hematuria, dribbling, incontinence MSK- denies joint pain, muscle aches, injury Neuro- denies headache, dizziness, syncope, seizure activity       Objective:    BP 138/86   Pulse 82   Temp 97.9 F (36.6 C) (Oral)   Resp 14   Ht 6\' 2"  (1.88 m)   Wt 173 lb (78.5 kg)   SpO2 96%   BMI 22.21 kg/m  GEN- NAD, alert and oriented x3 Skin- red bites on bilat lower legs, no lesions on trunks, arms, abdomen,        Assessment & Plan:    Note had small black/red bug on his hat   Problem List Items Addressed This Visit    None    Visit Diagnoses    Infestation by bed bug    -  Primary   Pt to get home exterminated, discussed  washing clothing, bedding, mattress care, will cover with permethrin, states he has had bugs before, TAC ointment for itc   Encounter for semen analysis       Interesting situation, will defer to urology, I can not answer his questions, assume he can have semen analysis done to see if vasectomy took   History of vasectomy          Note: This dictation was prepared with Dragon dictation along with smaller phrase technology. Any transcriptional errors that result from this process are unintentional.

## 2017-01-19 ENCOUNTER — Telehealth: Payer: Self-pay | Admitting: Family Medicine

## 2017-01-19 DIAGNOSIS — Z0189 Encounter for other specified special examinations: Secondary | ICD-10-CM

## 2017-01-19 DIAGNOSIS — Z3141 Encounter for fertility testing: Secondary | ICD-10-CM

## 2017-01-19 NOTE — Telephone Encounter (Signed)
Patient left vm asking when was he going to be scheduled for a sperm count at [facility]. I don't see a referral. I do see that Dr. Buelah Manis was going to set up a referral to urology however it hasn't been placed. Please advise.  CB# [phone]

## 2017-01-19 NOTE — Telephone Encounter (Signed)
Place the referral,look at my last note for diagnosis

## 2017-01-19 NOTE — Telephone Encounter (Signed)
Referral orders placed

## 2017-01-19 NOTE — Telephone Encounter (Signed)
MD please advise

## 2017-01-25 ENCOUNTER — Telehealth: Payer: Self-pay | Admitting: *Deleted

## 2017-01-25 NOTE — Telephone Encounter (Signed)
Received VM from patient.   Inquired as to status of referral.   Please contact to discuss.

## 2017-02-03 ENCOUNTER — Ambulatory Visit: Payer: Medicare Other | Admitting: Urology

## 2017-02-14 ENCOUNTER — Emergency Department (HOSPITAL_COMMUNITY): Payer: Medicare Other

## 2017-02-14 ENCOUNTER — Emergency Department (HOSPITAL_COMMUNITY)
Admission: EM | Admit: 2017-02-14 | Discharge: 2017-02-14 | Disposition: A | Discharge status: Home / Self Care | Payer: Medicare Other | Attending: Emergency Medicine | Admitting: Emergency Medicine

## 2017-02-14 ENCOUNTER — Encounter (HOSPITAL_COMMUNITY): Payer: Self-pay | Admitting: Emergency Medicine

## 2017-02-14 DIAGNOSIS — Y939 Activity, unspecified: Secondary | ICD-10-CM | POA: Diagnosis not present

## 2017-02-14 DIAGNOSIS — Y999 Unspecified external cause status: Secondary | ICD-10-CM | POA: Insufficient documentation

## 2017-02-14 DIAGNOSIS — Z79899 Other long term (current) drug therapy: Secondary | ICD-10-CM | POA: Insufficient documentation

## 2017-02-14 DIAGNOSIS — S50812A Abrasion of left forearm, initial encounter: Secondary | ICD-10-CM | POA: Insufficient documentation

## 2017-02-14 DIAGNOSIS — W231XXA Caught, crushed, jammed, or pinched between stationary objects, initial encounter: Secondary | ICD-10-CM | POA: Insufficient documentation

## 2017-02-14 DIAGNOSIS — F17290 Nicotine dependence, other tobacco product, uncomplicated: Secondary | ICD-10-CM | POA: Diagnosis not present

## 2017-02-14 DIAGNOSIS — S59912A Unspecified injury of left forearm, initial encounter: Secondary | ICD-10-CM | POA: Diagnosis present

## 2017-02-14 DIAGNOSIS — Y929 Unspecified place or not applicable: Secondary | ICD-10-CM | POA: Diagnosis not present

## 2017-02-14 DIAGNOSIS — S40812A Abrasion of left upper arm, initial encounter: Secondary | ICD-10-CM

## 2017-02-14 DIAGNOSIS — M25522 Pain in left elbow: Secondary | ICD-10-CM | POA: Diagnosis not present

## 2017-02-14 NOTE — ED Triage Notes (Signed)
Pt got his left arm caught between a tv and the wall.  Redness noted, no laceration, deformity, or swelling.

## 2017-02-14 NOTE — ED Notes (Signed)
Pt told this nurse he was going to leave.

## 2017-02-14 NOTE — ED Notes (Signed)
Patient transported to X-ray 

## 2017-02-14 NOTE — Discharge Instructions (Signed)
Return if any problems.

## 2017-02-15 ENCOUNTER — Other Ambulatory Visit: Payer: Self-pay

## 2017-02-15 NOTE — ED Provider Notes (Signed)
[facility] EMERGENCY DEPARTMENT Provider Note   CSN: [id number] Arrival date & time: 02/14/17  1623     History   Chief Complaint Chief Complaint  Patient presents with  . Arm Injury    HPI Jacob Reed is a 52 y.o. male.  The history is provided by the patient. No language interpreter was used.  Arm Injury   This is a new problem. The current episode started 6 to 12 hours ago. The problem occurs constantly. The pain is present in the left arm. The pain is moderate. Pertinent negatives include no numbness and full range of motion. He has tried nothing for the symptoms. The treatment provided no relief. There has been no history of extremity trauma.   Pt complains that his arm was pinched between a tv and wall.   Past Medical History:  Diagnosis Date  . Anxiety   . Arthritis   . Chronic neck and back pain   . Dental caries   . Sinusitis     Patient Active Problem List   Diagnosis Date Noted  . 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 DDD (degenerative disc disease), lumbar 09/22/2015  . DDD (degenerative disc disease), cervical 09/22/2015  . Insomnia 06/29/2013  . Tinnitus 06/26/2013  . Dizziness and giddiness 04/24/2013  . External hemorrhoid 04/24/2013  . Routine general medical examination at a health care facility 04/24/2013  . Depression with anxiety 11/13/2011  . Arthritis 11/13/2011  . Seasonal allergies 11/13/2011    Past Surgical History:  Procedure Laterality Date  . INGUINAL HERNIA REPAIR Right 10/01/2012   Procedure: HERNIA REPAIR INGUINAL ADULT;  Surgeon: Scherry Ran, MD;  Location: [facility];  Service: General;  Laterality: Right;  . TOOTH EXTRACTION    . VASECTOMY         Home Medications    Prior to Admission medications   Medication Sig Start Date End Date Taking? Authorizing Provider  acetaminophen (TYLENOL) 500 MG tablet Take 250 mg by mouth daily as needed for moderate pain.    [provider]  albuterol  (PROVENTIL HFA;VENTOLIN HFA) 108 (90 Base) MCG/ACT inhaler Inhale 2 puffs into the lungs every 4 (four) hours as needed for wheezing or shortness of breath. 11/02/16   Francine Graven, DO  diazepam (VALIUM) 5 MG tablet TAKE 1 TABLET BY MOUTH 2 TIMES DAILY AS NEEDED FOR ANXIETY. MUST LAST30 DAYS. 01/16/17   Alycia Rossetti, MD  naproxen (NAPROSYN) 500 MG tablet Take 1 tablet (500 mg total) by mouth 2 (two) times daily with a meal. As needed for pain 08/26/16   Alycia Rossetti, MD  triamcinolone cream (KENALOG) 0.1 % Apply 1 application topically 2 (two) times daily. 01/16/17   Agenda, Modena Nunnery, MD  valACYclovir (VALTREX) 500 MG tablet Take 1 tablet (500 mg total) by mouth daily. 11/11/16   Alycia Rossetti, MD    Family History Family History  Problem Relation Age of Onset  . Heart disease Father     Social History Social History   Tobacco Use  . Smoking status: Former Research scientist (life sciences)  . Smokeless tobacco: Current User    Types: Snuff  Substance Use Topics  . Alcohol use: No  . Drug use: No     Allergies   Tramadol   Review of Systems Review of Systems  Neurological: Negative for numbness.  All other systems reviewed and are negative.    Physical Exam Updated Vital Signs BP (!) 124/97 (BP Location: Right Arm)  Pulse (!) 112   Temp 98.1 F (36.7 C) (Oral)   Resp 18   Ht 6\' 2"  (1.88 m)   Wt 78.3 kg (172 lb 9.6 oz)   SpO2 100%   BMI 22.16 kg/m   Physical Exam  Constitutional: He is oriented to person, place, and time. He appears well-developed and well-nourished.  Musculoskeletal: He exhibits tenderness.  Neurological: He is alert and oriented to person, place, and time.  Skin: Skin is warm.  superficial abrasion left forearm,  Small area of redness  From nv and ns intact  Psychiatric: He has a normal mood and affect.  Nursing note and vitals reviewed.    ED Treatments / Results  Labs (all labs ordered are listed, but only abnormal results are displayed) Labs  Reviewed - No data to display  EKG  EKG Interpretation None       Radiology Dg Elbow Complete Left  Result Date: 02/14/2017 CLINICAL DATA:  Left elbow pain, posterior and ulnar side worse. left arm caught between a tv and the wall. Redness noted. Initial encounter. EXAM: LEFT ELBOW - COMPLETE 3+ VIEW COMPARISON:  None. FINDINGS: There is no evidence of fracture, dislocation, or joint effusion. There is no evidence of arthropathy or other focal bone abnormality. Soft tissues are unremarkable. IMPRESSION: Negative. Electronically Signed   By: Lajean Manes M.D.   On: 02/14/2017 17:34    Procedures Procedures (including critical care time)  Medications Ordered in ED Medications - No data to display   Initial Impression / Assessment and Plan / ED Course  I have reviewed the triage vital signs and the nursing notes.  Pertinent labs & imaging results that were available during my care of the patient were reviewed by me and considered in my medical decision making (see chart for details).     MDM/ED course   Xrays reviewed, no fracture,  Pt advised ice, ibuprofen.  Pt placed in an ace wrap at his request for comfort.  Final Clinical Impressions(s) / ED Diagnoses   Final diagnoses:  Abrasion of left upper extremity, initial encounter    ED Discharge Orders    None    An After Visit Summary was printed and given to the patient.   Fransico Meadow, PA-C 02/15/17 0041    Francine Graven, DO 02/15/17 1709

## 2017-02-27 ENCOUNTER — Ambulatory Visit (INDEPENDENT_AMBULATORY_CARE_PROVIDER_SITE_OTHER): Payer: Medicare Other | Admitting: Family Medicine

## 2017-02-27 ENCOUNTER — Encounter: Payer: Self-pay | Admitting: Family Medicine

## 2017-02-27 ENCOUNTER — Other Ambulatory Visit: Payer: Self-pay

## 2017-02-27 VITALS — BP 108/74 | HR 88 | Temp 98.8°F | Resp 14 | Ht 69.25 in | Wt 172.8 lb

## 2017-02-27 DIAGNOSIS — Z113 Encounter for screening for infections with a predominantly sexual mode of transmission: Secondary | ICD-10-CM

## 2017-02-27 DIAGNOSIS — Z1211 Encounter for screening for malignant neoplasm of colon: Secondary | ICD-10-CM

## 2017-02-27 DIAGNOSIS — M5030 Other cervical disc degeneration, unspecified cervical region: Secondary | ICD-10-CM

## 2017-02-27 DIAGNOSIS — Z125 Encounter for screening for malignant neoplasm of prostate: Secondary | ICD-10-CM

## 2017-02-27 DIAGNOSIS — Z0000 Encounter for general adult medical examination without abnormal findings: Secondary | ICD-10-CM

## 2017-02-27 DIAGNOSIS — I10 Essential (primary) hypertension: Secondary | ICD-10-CM | POA: Diagnosis not present

## 2017-02-27 DIAGNOSIS — F418 Other specified anxiety disorders: Secondary | ICD-10-CM

## 2017-02-27 LAB — URINALYSIS, ROUTINE W REFLEX MICROSCOPIC
BILIRUBIN URINE: NEGATIVE
GLUCOSE, UA: NEGATIVE
Hgb urine dipstick: NEGATIVE
Leukocytes, UA: NEGATIVE
Nitrite: NEGATIVE
PH: 5.5 (ref 5.0–8.0)
Protein, ur: NEGATIVE
Specific Gravity, Urine: 1.02 (ref 1.001–1.03)

## 2017-02-27 MED ORDER — TETANUS-DIPHTH-ACELL PERTUSSIS 5-2-15.5 LF-MCG/0.5 IM SUSP: 0.5000 mL | Freq: Once | INTRAMUSCULAR | 0 refills | Status: AC

## 2017-02-27 NOTE — Progress Notes (Signed)
Subjective:   Patient presents for Medicare Annual/Subsequent preventive examination.    Pt here for CPE   Continues to have neck pain,taking tylenol as  Needed, wants to see specialist,he has naprosyn taking as needed    Seen by urology but has not completed the testing    Review Past Medical/Family/Social:Per EMR    Risk Factors  Current exercise habits: walks some  Dietary issues discussed: Yes  Cardiac risk factors:Family history   Depression Screen  (Note: if answer to either of the following is "Yes", a more complete depression screening is indicated)  Over the past two weeks, have you felt down, depressed or hopeless? No Over the past two weeks, have you felt little interest or pleasure in doing things? No Have you lost interest or pleasure in daily life? No Do you often feel hopeless? No Do you cry easily over simple problems? No   Activities of Daily Living  In your present state of health, do you have any difficulty performing the following activities?:  Driving? No  Managing money? No  Feeding yourself? No  Getting from bed to chair? No  Climbing a flight of stairs? No  Preparing food and eating?: No  Bathing or showering? No  Getting dressed: No  Getting to the toilet? No  Using the toilet:No  Moving around from place to place: No  In the past year have you fallen or had a near fall?:yes Are you sexually active? No  Do you have more than one partner? No   Hearing Difficulties: - HAS BEEN EVALUATED BY ENT ALREADY  Do you often ask people to speak up or repeat themselves? No  Do you experience ringing or noises in your ears? Yes sometimes  Do you have difficulty understanding soft or whispered voices? No  Do you feel that you have a problem with memory? No Do you often misplace items? No  Do you feel safe at home? Yes  Cognitive Testing  Alert? Yes Normal Appearance?Yes  Oriented to person? Yes Place? Yes  Time? Yes  Recall of three objects? Yes  Can  perform simple calculations? Yes  Displays appropriate judgment?Yes  Can read the correct time from a watch face?Yes   List the Names of Other Physician/Practitioners you currently use:   None Currently    Screening Tests / Date Colonoscopy         Due             Influenza Vaccine declines  Tetanus/tdap Due   ROS: GEN- denies fatigue, fever, weight loss,weakness, recent illness HEENT- denies eye drainage, change in vision, nasal discharge, CVS- denies chest pain, palpitations RESP- denies SOB, cough, wheeze ABD- denies N/V, change in stools, abd pain GU- denies dysuria, hematuria, dribbling, incontinence MSK- denies joint pain, muscle aches, injury Neuro- denies headache, dizziness, syncope, seizure activity  PHYSICAL: GEN- NAD, alert and oriented x3 HEENT- PERRL, EOMI, non injected sclera, pink conjunctiva, MMM, oropharynx clear, TM clear bilat  Neck- Supple, no thryomegaly CVS- RRR, no murmur RESP-CTAB ABD-NABS,soft,NT,ND  Psych- normal affect and mood  Skin- mole hypopigmented with black specks  on left side of collar bone  EXT- No edema Pulses- Radial, DP- 2+    Assessment:    Annual wellness medicare exam   Plan:    During the course of the visit the patient was educated and counseled about appropriate screening and preventive services including:   Colorectal cancer screening - REFERRL TO GI Tetanus vaccine. Prescription given to that she can  get the vaccine at the pharmacy or Medicare part D.   Screen Neg  for depression.  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 Yes ____ Not Indicated __x__  Patient Instructions (the written plan) was given to the patient.  Medicare Attestation  I have personally reviewed:  The patient's medical and social history   Their use of alcohol, tobacco or illicit drugs  Their current medications and supplements  The patient's functional ability including ADLs,fall risks, home safety risks, cognitive, and hearing and visual impairment  Diet and physical activities  Evidence for depression or mood disorders  The patient's weight, height, BMI, and visual acuity have been recorded in the chart. I have made referrals, counseling, and provided education to the patient based on review of the above and I have provided the patient with a written personalized care plan for preventive services.

## 2017-02-27 NOTE — Patient Instructions (Addendum)
Referral to Dr. Luna Glasgow for your neck We will call with lab results TDAP sent to pharmacy  Take the naprosyn for your neck  Referral for colonoscopy  F/U for mole removal

## 2017-02-28 LAB — COMPREHENSIVE METABOLIC PANEL
AG Ratio: 1.8 (calc) (ref 1.0–2.5)
ALT: 11 U/L (ref 9–46)
AST: 16 U/L (ref 10–35)
Albumin: 4.6 g/dL (ref 3.6–5.1)
Alkaline phosphatase (APISO): 99 U/L (ref 40–115)
BUN: 11 mg/dL (ref 7–25)
CHLORIDE: 104 mmol/L (ref 98–110)
CO2: 24 mmol/L (ref 20–32)
CREATININE: 1.28 mg/dL (ref 0.70–1.33)
Calcium: 9.2 mg/dL (ref 8.6–10.3)
GLOBULIN: 2.6 g/dL (ref 1.9–3.7)
GLUCOSE: 85 mg/dL (ref 65–99)
Potassium: 4 mmol/L (ref 3.5–5.3)
Sodium: 138 mmol/L (ref 135–146)
TOTAL PROTEIN: 7.2 g/dL (ref 6.1–8.1)
Total Bilirubin: 0.6 mg/dL (ref 0.2–1.2)

## 2017-02-28 LAB — CBC WITH DIFFERENTIAL/PLATELET
BASOS PCT: 0.7 %
Basophils Absolute: 48 cells/uL (ref 0–200)
EOS ABS: 61 {cells}/uL (ref 15–500)
Eosinophils Relative: 0.9 %
HCT: 46.5 % (ref 38.5–50.0)
Hemoglobin: 15.5 g/dL (ref 13.2–17.1)
Lymphs Abs: 1510 cells/uL (ref 850–3900)
MCH: 29.5 pg (ref 27.0–33.0)
MCHC: 33.3 g/dL (ref 32.0–36.0)
MCV: 88.6 fL (ref 80.0–100.0)
MPV: 11.6 fL (ref 7.5–12.5)
Monocytes Relative: 6.2 %
NEUTROS ABS: 4760 {cells}/uL (ref 1500–7800)
Neutrophils Relative %: 70 %
Platelets: 196 10*3/uL (ref 140–400)
RBC: 5.25 10*6/uL (ref 4.20–5.80)
RDW: 12.6 % (ref 11.0–15.0)
Total Lymphocyte: 22.2 %
WBC: 6.8 10*3/uL (ref 3.8–10.8)
WBCMIX: 422 {cells}/uL (ref 200–950)

## 2017-02-28 LAB — RPR: RPR Ser Ql: NONREACTIVE

## 2017-02-28 LAB — C. TRACHOMATIS/N. GONORRHOEAE RNA
C. trachomatis RNA, TMA: DETECTED — AB
N. gonorrhoeae RNA, TMA: NOT DETECTED

## 2017-02-28 LAB — HIV ANTIBODY (ROUTINE TESTING W REFLEX): HIV 1&2 Ab, 4th Generation: NONREACTIVE

## 2017-02-28 LAB — PSA: PSA: 2.1 ng/mL (ref <=4.0)

## 2017-03-01 ENCOUNTER — Other Ambulatory Visit: Payer: Self-pay

## 2017-03-01 MED ORDER — AZITHROMYCIN 500 MG PO TABS: ORAL_TABLET | ORAL | 0 refills | Status: DC

## 2017-03-03 ENCOUNTER — Telehealth: Payer: Self-pay

## 2017-03-03 NOTE — Telephone Encounter (Signed)
[employer] notified of patient STD

## 2017-03-03 NOTE — Telephone Encounter (Signed)
-----   Message from Jacob Rossetti, MD sent at 02/28/2017  2:08 PM EST ----- Call pt he has Chlamydia, other labs were normal Take Azithromycin 1000mg  po x 1 dose, recommend partner be treated as well He needs to repeat STD screening in 3 months

## 2017-03-06 ENCOUNTER — Telehealth: Payer: Self-pay | Admitting: Orthopedic Surgery

## 2017-03-06 NOTE — Telephone Encounter (Signed)
Patient referred for degenerative disc disease per primary care Dr Buelah Manis; notes are in Epic chart; please review and advise.

## 2017-03-07 NOTE — Telephone Encounter (Signed)
What is the next available new patient appt? That is the one you give   Is this an issue???

## 2017-03-07 NOTE — Telephone Encounter (Signed)
Will call patient and advise of next available.

## 2017-03-14 ENCOUNTER — Other Ambulatory Visit: Payer: Self-pay | Admitting: Family Medicine

## 2017-03-14 NOTE — Telephone Encounter (Signed)
Ok to refill??  Last office visit 02/27/2017.  Last refill 01/16/2017, # 1 refill.

## 2017-03-21 ENCOUNTER — Encounter: Payer: Self-pay | Admitting: *Deleted

## 2017-03-21 ENCOUNTER — Telehealth: Payer: Self-pay | Admitting: *Deleted

## 2017-03-21 ENCOUNTER — Ambulatory Visit: Payer: Medicare Other

## 2017-03-21 NOTE — Telephone Encounter (Signed)
noted 

## 2017-03-21 NOTE — Telephone Encounter (Signed)
PATIENT WAS A NO SHOW AND LETTER SENT  °

## 2017-03-28 ENCOUNTER — Other Ambulatory Visit: Payer: Self-pay

## 2017-03-28 ENCOUNTER — Ambulatory Visit (INDEPENDENT_AMBULATORY_CARE_PROVIDER_SITE_OTHER): Payer: Medicare Other | Admitting: Family Medicine

## 2017-03-28 ENCOUNTER — Encounter: Payer: Self-pay | Admitting: Family Medicine

## 2017-03-28 VITALS — BP 126/64 | HR 88 | Temp 98.2°F | Resp 14 | Ht 69.25 in | Wt 174.0 lb

## 2017-03-28 DIAGNOSIS — D229 Melanocytic nevi, unspecified: Secondary | ICD-10-CM

## 2017-03-28 DIAGNOSIS — L820 Inflamed seborrheic keratosis: Secondary | ICD-10-CM | POA: Diagnosis not present

## 2017-03-28 NOTE — Progress Notes (Signed)
   Subjective:    Patient ID: Jacob Reed, male    DOB: [birth year], 52 y.o.   MRN: [id number]  Patient presents for Neoplasm Removal (neoplasm to neck that he wants removed)  Pt here for mole removal, noticed mole has been changing colors on collar bone  no pain, has been present for years     Review Of Systems:  GEN- denies fatigue, fever, weight loss,weakness, recent illness HEENT- denies eye drainage, change in vision, nasal discharge, CVS- denies chest pain, palpitations RESP- denies SOB, cough, wheeze ABD- denies N/V, change in stools, abd pain GU- denies dysuria, hematuria, dribbling, incontinence MSK- denies joint pain, muscle aches, injury Neuro- denies headache, dizziness, syncope, seizure activity       Objective:    BP 126/64   Pulse 88   Temp 98.2 F (36.8 C) (Oral)   Resp 14   Ht 5' 9.25" (1.759 m)   Wt 174 lb (78.9 kg)   SpO2 96%   BMI 25.51 kg/m  GEN- NAD, alert and oriented x3 Psych- normal affect and mood  Skin- mole hypopigmented with black specks  on left side of collar bone  EXT- No edema  Procedure- Mole Removal Procedure explained to patient questions answered benefits and risks discussed verbal consent obtained. Antiseptic-betadine Anesthesia-lidocaine 1% with Epi  Razor blade used to shave off mole  Minimal blood loss, drysol  touched to lesion on neck and axilla due to persistent oozing Patient tolerated procedure well Bandage applied       Assessment & Plan:      Problem List Items Addressed This Visit    None    Visit Diagnoses    Atypical nevus    -  Primary   discussed after care, neosporin or vaseline with bandaid, f/u pathology results   Relevant Orders   Pathology Report      Note: This dictation was prepared with Dragon dictation along with smaller phrase technology. Any transcriptional errors that result from this process are unintentional.

## 2017-03-28 NOTE — Patient Instructions (Signed)
We will call with results F/U 6 months

## 2017-03-30 LAB — PATHOLOGY

## 2017-03-30 LAB — TISSUE SPECIMEN

## 2017-04-14 ENCOUNTER — Other Ambulatory Visit: Payer: Self-pay | Admitting: Family Medicine

## 2017-04-14 NOTE — Telephone Encounter (Signed)
Last OV 03/28/2017 Last refill 03/14/2017 Ok to refill?

## 2017-05-12 ENCOUNTER — Encounter: Payer: Self-pay | Admitting: Family Medicine

## 2017-05-12 ENCOUNTER — Other Ambulatory Visit: Payer: Self-pay

## 2017-05-12 ENCOUNTER — Ambulatory Visit (INDEPENDENT_AMBULATORY_CARE_PROVIDER_SITE_OTHER): Payer: Medicare Other | Admitting: Family Medicine

## 2017-05-12 VITALS — BP 130/68 | HR 86 | Temp 98.5°F | Resp 16 | Ht 69.25 in | Wt 172.0 lb

## 2017-05-12 DIAGNOSIS — J302 Other seasonal allergic rhinitis: Secondary | ICD-10-CM | POA: Diagnosis not present

## 2017-05-12 DIAGNOSIS — Z113 Encounter for screening for infections with a predominantly sexual mode of transmission: Secondary | ICD-10-CM | POA: Diagnosis not present

## 2017-05-12 DIAGNOSIS — Z79899 Other long term (current) drug therapy: Secondary | ICD-10-CM

## 2017-05-12 DIAGNOSIS — Z0289 Encounter for other administrative examinations: Secondary | ICD-10-CM | POA: Diagnosis not present

## 2017-05-12 NOTE — Patient Instructions (Addendum)
Take clartin and use flonase  F/U as previous

## 2017-05-12 NOTE — Progress Notes (Signed)
   Subjective:    Patient ID: Jacob Reed, male    DOB: 01-08-1966, 52 y.o.   MRN: [id number]  Patient presents for Allergies (increased nasal congestion, watery eyes, post nasal drip) and Labs (is fasting)   Pt here with allergies, watery eyes, nasal congestion/runny nose,feels pressure behind eyes, sneezing, occasional, no OTC meds taken    Due for repeat lab for HIV/RPR will try to leave urine for GC    Review Of Systems:  GEN- denies fatigue, fever, weight loss,weakness, recent illness HEENT- +eye drainage, change in vision,+ nasal discharge, CVS- denies chest pain, palpitations RESP- denies SOB, cough, wheeze ABD- denies N/V, change in stools, abd pain Neuro- denies headache, dizziness, syncope, seizure activity       Objective:    BP 130/68   Pulse 86   Temp 98.5 F (36.9 C) (Oral)   Resp 16   Ht 5' 9.25" (1.759 m)   Wt 172 lb (78 kg)   SpO2 96%   BMI 25.22 kg/m  GEN- NAD, alert and oriented x3 HEENT- PERRL, EOMI, non injected sclera, pink conjunctiva, MMM, oropharynx clear, TM clear bilat no effusion,  No maxillary sinus tenderness, clear Nasal drainage  Neck- Supple, no LAD CVS- RRR, no murmur RESP-CTAB Pulses- Radial 2+          Assessment & Plan:      Problem List Items Addressed This Visit      Unprioritized   Seasonal allergies - Primary    He can take OTC claritin and nasal steroid       Other Visit Diagnoses    Screen for STD (sexually transmitted disease)       Relevant Orders   Drugs of abuse screen w/o alc, rtn urine-sln (Completed)   HIV antibody (Completed)   RPR   Long-term use of high-risk medication       on chronic valium for anxiety/sleep      Note: This dictation was prepared with Dragon dictation along with smaller phrase technology. Any transcriptional errors that result from this process are unintentional.

## 2017-05-13 LAB — DRUGS OF ABUSE SCREEN W/O ALC, ROUTINE URINE
AMPHETAMINES (1000 ng/mL SCRN): NEGATIVE
BARBITURATES: NEGATIVE
BENZODIAZEPINES: NEGATIVE
COCAINE METABOLITES: NEGATIVE
MARIJUANA MET (50 NG/ML SCRN): NEGATIVE
METHADONE: NEGATIVE
METHAQUALONE: NEGATIVE
OPIATES: NEGATIVE
PHENCYCLIDINE: NEGATIVE
PROPOXYPHENE: NEGATIVE

## 2017-05-13 LAB — HIV ANTIBODY (ROUTINE TESTING W REFLEX): HIV: NONREACTIVE

## 2017-05-14 ENCOUNTER — Encounter: Payer: Self-pay | Admitting: Family Medicine

## 2017-05-14 NOTE — Assessment & Plan Note (Signed)
He can take OTC claritin and nasal steroid

## 2017-05-15 ENCOUNTER — Other Ambulatory Visit: Payer: Self-pay | Admitting: Family Medicine

## 2017-05-15 LAB — RPR: RPR Ser Ql: NONREACTIVE

## 2017-05-15 NOTE — Telephone Encounter (Signed)
Ok to refill??  Last office visit 05/12/2017.  Last refill 04/14/2017.

## 2017-05-16 ENCOUNTER — Encounter (HOSPITAL_COMMUNITY): Payer: Self-pay | Admitting: Emergency Medicine

## 2017-05-16 ENCOUNTER — Emergency Department (HOSPITAL_COMMUNITY)
Admission: EM | Admit: 2017-05-16 | Discharge: 2017-05-16 | Disposition: A | Discharge status: Home / Self Care | Payer: Medicare Other | Attending: Emergency Medicine | Admitting: Emergency Medicine

## 2017-05-16 ENCOUNTER — Other Ambulatory Visit: Payer: Self-pay

## 2017-05-16 ENCOUNTER — Other Ambulatory Visit: Payer: Self-pay | Admitting: Family Medicine

## 2017-05-16 DIAGNOSIS — Z202 Contact with and (suspected) exposure to infections with a predominantly sexual mode of transmission: Secondary | ICD-10-CM | POA: Insufficient documentation

## 2017-05-16 LAB — URINALYSIS, ROUTINE W REFLEX MICROSCOPIC
Bilirubin Urine: NEGATIVE
Glucose, UA: NEGATIVE mg/dL
HGB URINE DIPSTICK: NEGATIVE
KETONES UR: NEGATIVE mg/dL
Leukocytes, UA: NEGATIVE
Nitrite: NEGATIVE
PROTEIN: NEGATIVE mg/dL
Specific Gravity, Urine: 1.021 (ref 1.005–1.030)
pH: 6 (ref 5.0–8.0)
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
## 2017-05-16 NOTE — Discharge Instructions (Addendum)
Your urine test today is negative for any signs of infection.  A gonorrhea chlamydia culture have been sent to the lab.  If there are any abnormalities, someone from the flow managers office will call you with instructions.  The tests that you had done on May 3 for HIV and syphilis are both negative.  Your urine drug screen is also negative.  Please discuss your findings with Dr. Buelah Manis, and follow-up with your doctors in the office.

## 2017-05-19 ENCOUNTER — Other Ambulatory Visit: Payer: Self-pay

## 2017-05-19 ENCOUNTER — Ambulatory Visit (INDEPENDENT_AMBULATORY_CARE_PROVIDER_SITE_OTHER): Payer: Medicare Other | Admitting: Family Medicine

## 2017-05-19 ENCOUNTER — Encounter: Payer: Self-pay | Admitting: Family Medicine

## 2017-05-19 VITALS — BP 130/76 | HR 88 | Temp 98.2°F | Resp 16 | Ht 69.25 in | Wt 172.0 lb

## 2017-05-19 DIAGNOSIS — Z113 Encounter for screening for infections with a predominantly sexual mode of transmission: Secondary | ICD-10-CM | POA: Diagnosis not present

## 2017-05-19 DIAGNOSIS — R892 Abnormal level of other drugs, medicaments and biological substances in specimens from other organs, systems and tissues: Secondary | ICD-10-CM
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
## 2017-05-19 NOTE — Patient Instructions (Signed)
No More valium will be prescribed F/U in Fall as prescribed

## 2017-05-20 LAB — C. TRACHOMATIS/N. GONORRHOEAE RNA
C. TRACHOMATIS RNA, TMA: NOT DETECTED
N. gonorrhoeae RNA, TMA: NOT DETECTED

## 2017-05-31 ENCOUNTER — Ambulatory Visit: Payer: Self-pay | Admitting: Orthopaedic Surgery

## 2017-06-01 ENCOUNTER — Ambulatory Visit (INDEPENDENT_AMBULATORY_CARE_PROVIDER_SITE_OTHER): Payer: Medicare Other

## 2017-06-01 ENCOUNTER — Encounter: Payer: Self-pay | Admitting: Orthopaedic Surgery

## 2017-06-01 ENCOUNTER — Ambulatory Visit (INDEPENDENT_AMBULATORY_CARE_PROVIDER_SITE_OTHER): Payer: Medicare Other | Admitting: Orthopaedic Surgery

## 2017-06-01 VITALS — BP 118/75 | HR 74 | Temp 98.0°F | Ht 74.0 in | Wt 179.0 lb

## 2017-06-01 DIAGNOSIS — G8929 Other chronic pain: Secondary | ICD-10-CM | POA: Diagnosis not present

## 2017-06-01 DIAGNOSIS — M545 Low back pain: Secondary | ICD-10-CM

## 2017-06-01 NOTE — Patient Instructions (Addendum)
Chronic Back Pain When back pain lasts longer than 3 months, it is called chronic back pain.The cause of your back pain may not be known. Some common causes include:  Wear and tear (degenerative disease) of the bones, ligaments, or disks in your back.  Inflammation and stiffness in your back (arthritis).  People who have chronic back pain often go through certain periods in which the pain is more intense (flare-ups). Many people can learn to manage the pain with home care. Follow these instructions at home: Pay attention to any changes in your symptoms. Take these actions to help with your pain: Activity  Avoid bending and activities that make the problem worse.  Do not sit or stand in one place for long periods of time.  Take brief periods of rest throughout the day. This will reduce your pain. Resting in a lying or standing position is usually better than sitting to rest.  When you are resting for longer periods, mix in some mild activity or stretching between periods of rest. This will help to prevent stiffness and pain.  Get regular exercise. Ask your health care provider what activities are safe for you.  Do not lift anything that is heavier than 10 lb (4.5 kg). Always use proper lifting technique, which includes: ? Bending your knees. ? Keeping the load close to your body. ? Avoiding twisting. Managing pain  If directed, apply ice to the painful area. Your health care provider may recommend applying ice during the first 24-48 hours after a flare-up begins. ? Put ice in a plastic bag. ? Place a towel between your skin and the bag. ? Leave the ice on for 20 minutes, 2-3 times per day.  After icing, apply heat to the affected area as often as told by your health care provider. Use the heat source that your health care provider recommends, such as a moist heat pack or a heating pad. ? Place a towel between your skin and the heat source. ? Leave the heat on for 20-30  minutes. ? Remove the heat if your skin turns bright red. This is especially important if you are unable to feel pain, heat, or cold. You may have a greater risk of getting burned.  Try soaking in a warm tub.  Take over-the-counter and prescription medicines only as told by your health care provider.  Keep all follow-up visits as told by your health care provider. This is important. Contact a health care provider if:  You have pain that is not relieved with rest or medicine. Get help right away if:  You have weakness or numbness in one or both of your legs or feet.  You have trouble controlling your bladder or your bowels.  You have nausea or vomiting.  You have pain in your abdomen.  You have shortness of breath or you faint. This information is not intended to replace advice given to you by your health care provider. Make sure you discuss any questions you have with your health care provider. Document Released: 02/04/2004 Document Revised: 05/07/2015 Document Reviewed: 06/16/2014 Elsevier Interactive Patient Education  [employer].    ..Physical therapy has been ordered for you at [facility] is the phone number to call if you want to call to schedule. Please let us know if you do not hear anything within one week.

## 2017-06-01 NOTE — Progress Notes (Signed)
Subjective:  My back and neck hurt    Patient ID: Jacob Reed, male    DOB: 12-02-1965, 52 y.o.   MRN: [id number]  HPI He says he has had back pain since as a child.  He has long history of his back hurting.  He has no trauma.  He has no numbness.  He has no weakness.  He says he has learned to adjust to having back pain all the time.  The pain is sharp, deep and throbbing at times.  He has taken all sorts of medicine with no help.  He has seen multiple physicians for this over time.  He had x-rays about three years ago.  He says he is tired of hurting.  Nothing makes him better.  He takes some Tylenol now.  He has history of chronic neck pain.  Review of Systems  Respiratory: Negative for cough and shortness of breath.   Cardiovascular: Negative for chest pain and leg swelling.  Musculoskeletal: Positive for arthralgias, back pain and neck pain.  Psychiatric/Behavioral: The patient is nervous/anxious.   All other systems reviewed and are negative.  Past Medical History:  Diagnosis Date  . Anxiety   . Arthritis   . Chronic neck and back pain   . Dental caries   . Sinusitis     Past Surgical History:  Procedure Laterality Date  . INGUINAL HERNIA REPAIR Right 10/01/2012   Procedure: HERNIA REPAIR INGUINAL ADULT;  Surgeon: Scherry Ran, MD;  Location: [facility];  Service: General;  Laterality: Right;  . TOOTH EXTRACTION    . VASECTOMY      Current Outpatient Medications on File Prior to Visit  Medication Sig Dispense Refill  . acetaminophen (TYLENOL) 500 MG tablet Take 250 mg by mouth daily as needed for moderate pain.    Marland Kitchen albuterol (PROVENTIL HFA;VENTOLIN HFA) 108 (90 Base) MCG/ACT inhaler Inhale 2 puffs into the lungs every 4 (four) hours as needed for wheezing or shortness of breath. (Patient not taking: Reported on 06/01/2017) 1 Inhaler 0  . diazepam (VALIUM) 5 MG tablet TAKE 1 TABLET BY MOUTH 2 TIMES DAILY AS NEEDED FOR ANXIETY. MUST LAST30 DAYS. (Patient not taking:  Reported on 06/01/2017) 45 tablet 0  . naproxen (NAPROSYN) 500 MG tablet Take 1 tablet (500 mg total) by mouth 2 (two) times daily with a meal. As needed for pain (Patient not taking: Reported on 06/01/2017) 60 tablet 3  . triamcinolone cream (KENALOG) 0.1 % Apply 1 application topically 2 (two) times daily. (Patient not taking: Reported on 06/01/2017) 30 g 0  . valACYclovir (VALTREX) 500 MG tablet Take 1 tablet (500 mg total) by mouth daily. (Patient not taking: Reported on 06/01/2017) 30 tablet 6   No current facility-administered medications on file prior to visit.     Social History   Socioeconomic History  . Marital status: Legally Separated    Spouse name: Not on file  . Number of children: Not on file  . Years of education: Not on file  . Highest education level: Not on file  Occupational History  . Not on file  Social Needs  . Financial resource strain: Not on file  . Food insecurity:    Worry: Not on file    Inability: Not on file  . Transportation needs:    Medical: Not on file    Non-medical: Not on file  Tobacco Use  . Smoking status: Former Research scientist (life sciences)  . Smokeless tobacco: Current User    Types:  Snuff  Substance and Sexual Activity  . Alcohol use: No  . Drug use: No  . Sexual activity: Not on file  Lifestyle  . Physical activity:    Days per week: Not on file    Minutes per session: Not on file  . Stress: Not on file  Relationships  . Social connections:    Talks on phone: Not on file    Gets together: Not on file    Attends religious service: Not on file    Active member of club or organization: Not on file    Attends meetings of clubs or organizations: Not on file    Relationship status: Not on file  . Intimate partner violence:    Fear of current or ex partner: Not on file    Emotionally abused: Not on file    Physically abused: Not on file    Forced sexual activity: Not on file  Other Topics Concern  . Not on file  Social History Narrative  . Not on  file    Family History  Problem Relation Age of Onset  . Arthritis Mother   . Heart disease Father   . Arthritis Father     BP 118/75   Pulse 74   Temp 98 F (36.7 C)   Ht 6\' 2"  (1.88 m)   Wt 179 lb (81.2 kg)   BMI 22.98 kg/m       Objective:   Physical Exam  Constitutional: He is oriented to person, place, and time. He appears well-developed and well-nourished.  HENT:  Head: Normocephalic and atraumatic.  Eyes: Pupils are equal, round, and reactive to light. Conjunctivae and EOM are normal.  Neck: Normal range of motion. Neck supple.  Cardiovascular: Normal rate, regular rhythm and intact distal pulses.  Pulmonary/Chest: Effort normal.  Abdominal: Soft.  Musculoskeletal:       Lumbar back: He exhibits tenderness and pain.       Back:  Neurological: He is alert and oriented to person, place, and time. He has normal reflexes. He displays normal reflexes. No cranial nerve deficit. He exhibits normal muscle tone. Coordination normal.  Skin: Skin is warm and dry.  Psychiatric: He has a normal mood and affect. His behavior is normal. Judgment and thought content normal.    X-rays of the lumbar spine were done, reported separately.      Assessment & Plan:   Encounter Diagnosis  Name Primary?  . Chronic midline low back pain without sciatica Yes   I have told him his x-rays look good.  I compared them to ones done in 2016 and there is no significant change.  He has been on Naprosyn from Dr. Buelah Manis and I told him to continue this.  He says he does not know if it helps but he has been on it just a short time.  I see no need to consider any surgery.  That was one of his major concerns.  I have recommended PT.  Return in one month.  Call if any problem.  Precautions discussed.   Electronically Signed Sanjuana Kava, MD 5/23/20191:11 PM

## 2017-06-12 ENCOUNTER — Ambulatory Visit (HOSPITAL_COMMUNITY): Payer: Medicare Other | Attending: Orthopaedic Surgery

## 2017-06-16 ENCOUNTER — Ambulatory Visit (HOSPITAL_COMMUNITY): Payer: Medicare Other | Admitting: Physical Therapy

## 2017-06-28 ENCOUNTER — Ambulatory Visit (HOSPITAL_COMMUNITY): Payer: Medicare Other

## 2017-07-04 ENCOUNTER — Ambulatory Visit: Payer: Medicare Other | Admitting: Orthopaedic Surgery

## 2017-07-11 ENCOUNTER — Ambulatory Visit (HOSPITAL_COMMUNITY): Payer: Medicare Other

## 2017-07-26 ENCOUNTER — Other Ambulatory Visit: Payer: Self-pay | Admitting: Family Medicine

## 2017-07-26 ENCOUNTER — Other Ambulatory Visit: Payer: Self-pay

## 2017-07-26 ENCOUNTER — Ambulatory Visit (INDEPENDENT_AMBULATORY_CARE_PROVIDER_SITE_OTHER): Payer: Medicare Other | Admitting: Family Medicine

## 2017-07-26 ENCOUNTER — Encounter: Payer: Self-pay | Admitting: Family Medicine

## 2017-07-26 VITALS — BP 118/66 | HR 90 | Temp 98.1°F | Resp 14 | Ht 74.0 in | Wt 176.0 lb

## 2017-07-26 DIAGNOSIS — R531 Weakness: Secondary | ICD-10-CM | POA: Diagnosis not present

## 2017-07-26 DIAGNOSIS — Z113 Encounter for screening for infections with a predominantly sexual mode of transmission: Secondary | ICD-10-CM

## 2017-07-26 LAB — URINALYSIS, ROUTINE W REFLEX MICROSCOPIC
BILIRUBIN URINE: NEGATIVE
Glucose, UA: NEGATIVE
Hgb urine dipstick: NEGATIVE
Ketones, ur: NEGATIVE
LEUKOCYTES UA: NEGATIVE
NITRITE: NEGATIVE
PROTEIN: NEGATIVE
SPECIFIC GRAVITY, URINE: 1.025 (ref 1.001–1.03)
pH: 5 (ref 5.0–8.0)

## 2017-07-26 NOTE — Progress Notes (Signed)
   Subjective:    Patient ID: Jacob Reed, male    DOB: [birth year], 52 y.o.   MRN: [id number]  Patient presents for STD Check   Pt here for STD check. Had sex with a woman who he states "walks the streets".  Treated for chlamydia back 4 months ago No symptoms  Occasional has weak spells, cant really describe well, states arms and legs get weak, no swelling of joints, no HA, no change in vision    Review Of Systems:  GEN- denies fatigue, fever, weight loss,+weakness, recent illness HEENT- denies eye drainage, change in vision, nasal discharge, CVS- denies chest pain, palpitations RESP- denies SOB, cough, wheeze ABD- denies N/V, change in stools, abd pain GU- denies dysuria, hematuria, dribbling, incontinence MSK- denies joint pain, muscle aches, injury Neuro- denies headache, dizziness, syncope, seizure activity       Objective:    BP 118/66   Pulse 90   Temp 98.1 F (36.7 C) (Oral)   Resp 14   Ht 6\' 2"  (1.88 m)   Wt 176 lb (79.8 kg)   SpO2 97%   BMI 22.60 kg/m  GEN- NAD, alert and oriented x3,well appearing HEENT- PERRL, EOMI, non injected sclera, pink conjunctiva, MMM, oropharynx clear CVS- RRR, no murmur RESP-CTAB Neuro- CNII-XII in tact, no deficits Pulses- Radial  2+        Assessment & Plan:      Problem List Items Addressed This Visit    None    Visit Diagnoses    Screen for STD (sexually transmitted disease)    -  Primary   discussed use of condoms, he thinks latex irritates, advised to buy latex free. screening done   Relevant Orders   HIV antibody (Completed)   RPR   Urinalysis, Routine w reflex microscopic (Completed)   C. trachomatis/N. gonorrhoeae RNA (Completed)   Weakness       normal exam, ? symptoms, no red flags   Relevant Orders   CBC with Differential/Platelet (Completed)   Comprehensive metabolic panel (Completed)      Note: This dictation was prepared with Dragon dictation along with smaller phrase technology. Any  transcriptional errors that result from this process are unintentional.

## 2017-07-26 NOTE — Patient Instructions (Addendum)
F/U cancel Sept appointment  Schedule physical Feb

## 2017-07-27 ENCOUNTER — Encounter: Payer: Self-pay | Admitting: Family Medicine

## 2017-07-27 LAB — CBC WITH DIFFERENTIAL/PLATELET
BASOS PCT: 0.7 %
Basophils Absolute: 52 cells/uL (ref 0–200)
EOS PCT: 1.4 %
Eosinophils Absolute: 104 cells/uL (ref 15–500)
HEMATOCRIT: 44.4 % (ref 38.5–50.0)
Hemoglobin: 15 g/dL (ref 13.2–17.1)
LYMPHS ABS: 1643 {cells}/uL (ref 850–3900)
MCH: 30 pg (ref 27.0–33.0)
MCHC: 33.8 g/dL (ref 32.0–36.0)
MCV: 88.8 fL (ref 80.0–100.0)
MPV: 11.8 fL (ref 7.5–12.5)
Monocytes Relative: 6.2 %
NEUTROS ABS: 5143 {cells}/uL (ref 1500–7800)
NEUTROS PCT: 69.5 %
Platelets: 181 10*3/uL (ref 140–400)
RBC: 5 10*6/uL (ref 4.20–5.80)
RDW: 12.8 % (ref 11.0–15.0)
Total Lymphocyte: 22.2 %
WBC: 7.4 10*3/uL (ref 3.8–10.8)
WBCMIX: 459 {cells}/uL (ref 200–950)

## 2017-07-27 LAB — COMPREHENSIVE METABOLIC PANEL
AG Ratio: 1.9 (calc) (ref 1.0–2.5)
ALT: 14 U/L (ref 9–46)
AST: 19 U/L (ref 10–35)
Albumin: 4.5 g/dL (ref 3.6–5.1)
Alkaline phosphatase (APISO): 108 U/L (ref 40–115)
BILIRUBIN TOTAL: 0.4 mg/dL (ref 0.2–1.2)
BUN/Creatinine Ratio: 9 (calc) (ref 6–22)
BUN: 13 mg/dL (ref 7–25)
CALCIUM: 9 mg/dL (ref 8.6–10.3)
CO2: 23 mmol/L (ref 20–32)
Chloride: 105 mmol/L (ref 98–110)
Creat: 1.37 mg/dL — ABNORMAL HIGH (ref 0.70–1.33)
Globulin: 2.4 g/dL (calc) (ref 1.9–3.7)
Glucose, Bld: 138 mg/dL — ABNORMAL HIGH (ref 65–99)
Potassium: 3.8 mmol/L (ref 3.5–5.3)
Sodium: 139 mmol/L (ref 135–146)
Total Protein: 6.9 g/dL (ref 6.1–8.1)

## 2017-07-27 LAB — C. TRACHOMATIS/N. GONORRHOEAE RNA
C. trachomatis RNA, TMA: NOT DETECTED
N. gonorrhoeae RNA, TMA: NOT DETECTED

## 2017-07-27 LAB — RPR: RPR: NONREACTIVE

## 2017-07-27 LAB — HIV ANTIBODY (ROUTINE TESTING W REFLEX): HIV: NONREACTIVE

## 2017-08-26 ENCOUNTER — Encounter (HOSPITAL_COMMUNITY): Payer: Self-pay | Admitting: Emergency Medicine

## 2017-08-26 ENCOUNTER — Emergency Department (HOSPITAL_COMMUNITY)
Admission: EM | Admit: 2017-08-26 | Discharge: 2017-08-26 | Disposition: A | Discharge status: Home / Self Care | Payer: Medicare Other | Attending: Emergency Medicine | Admitting: Emergency Medicine

## 2017-08-26 ENCOUNTER — Other Ambulatory Visit: Payer: Self-pay

## 2017-08-26 DIAGNOSIS — N399 Disorder of urinary system, unspecified: Secondary | ICD-10-CM | POA: Diagnosis not present

## 2017-08-26 DIAGNOSIS — F17220 Nicotine dependence, chewing tobacco, uncomplicated: Secondary | ICD-10-CM | POA: Diagnosis not present

## 2017-08-26 DIAGNOSIS — R3989 Other symptoms and signs involving the genitourinary system: Secondary | ICD-10-CM | POA: Diagnosis not present

## 2017-08-26 DIAGNOSIS — R300 Dysuria: Secondary | ICD-10-CM | POA: Diagnosis present

## 2017-08-26 LAB — URINALYSIS, ROUTINE W REFLEX MICROSCOPIC
Bilirubin Urine: NEGATIVE
Glucose, UA: NEGATIVE mg/dL
Hgb urine dipstick: NEGATIVE
Ketones, ur: NEGATIVE mg/dL
LEUKOCYTES UA: NEGATIVE
Nitrite: NEGATIVE
PROTEIN: NEGATIVE mg/dL
Specific Gravity, Urine: 1.028 (ref 1.005–1.030)
pH: 5 (ref 5.0–8.0)

## 2017-08-26 NOTE — ED Notes (Signed)
Patient left facility without discharge instructions.

## 2017-08-26 NOTE — Discharge Instructions (Addendum)
Your vital signs are within normal limits.  Your urine test is also within normal limits except for very mild/early dehydration.  Please increase water, juices, Gatorade.  Please see Dr. Buelah Manis, or return to the emergency department if any changes in your condition, problems, or concerns.

## 2017-08-26 NOTE — ED Triage Notes (Signed)
Patient c/o dysuria, odor, and darkness in urine. Per patient working in heat recently. Denies any nausea, vomiting, or fevers.

## 2017-08-26 NOTE — ED Provider Notes (Signed)
[facility] EMERGENCY DEPARTMENT Provider Note   CSN: [id number] Arrival date & time: 08/26/17  1151     History   Chief Complaint Chief Complaint  Patient presents with  . Dysuria    HPI Jacob Reed is a 52 y.o. male.  Patient is a 52 year old male who presents to the emergency department with a concern about his urine.  The patient states that he has been working outside a lot lately and now he notices that his urine is dark, has odor, and at times he has some mild discomfort when urinating.  He says he has been drinking a lot of water and Gatorade while he is outside, but wanted to have this checked.  He denies fever or chills.  No unusual back pain reported.  No injury to the kidney or bladder area.  No recent operations or procedures reported.  The history is provided by the patient.    Past Medical History:  Diagnosis Date  . Anxiety   . Arthritis   . Chronic neck and back pain   . Dental caries   . Sinusitis     Patient Active Problem List   Diagnosis Date Noted  . 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 DDD (degenerative disc disease), lumbar 09/22/2015  . DDD (degenerative disc disease), cervical 09/22/2015  . Insomnia 06/29/2013  . Tinnitus 06/26/2013  . Dizziness and giddiness 04/24/2013  . External hemorrhoid 04/24/2013  . Routine general medical examination at a health care facility 04/24/2013  . Depression with anxiety 11/13/2011  . Arthritis 11/13/2011  . Seasonal allergies 11/13/2011    Past Surgical History:  Procedure Laterality Date  . INGUINAL HERNIA REPAIR Right 10/01/2012   Procedure: HERNIA REPAIR INGUINAL ADULT;  Surgeon: Scherry Ran, MD;  Location: [facility];  Service: General;  Laterality: Right;  . TOOTH EXTRACTION    . VASECTOMY          Home Medications    Prior to Admission medications   Not on File    Family History Family History  Problem Relation Age of Onset  . Arthritis Mother   . Heart disease  Father   . Arthritis Father     Social History Social History   Tobacco Use  . Smoking status: Former Research scientist (life sciences)  . Smokeless tobacco: Current User    Types: Snuff  Substance Use Topics  . Alcohol use: No  . Drug use: No     Allergies   Tramadol   Review of Systems Review of Systems  Constitutional: Negative for activity change.       All ROS Neg except as noted in HPI  HENT: Negative for nosebleeds.   Eyes: Negative for photophobia and discharge.  Respiratory: Negative for cough, shortness of breath and wheezing.   Cardiovascular: Negative for chest pain and palpitations.  Gastrointestinal: Negative for abdominal pain and blood in stool.  Genitourinary: Positive for dysuria. Negative for discharge, frequency, hematuria, scrotal swelling, testicular pain and urgency.       Dark urine  Musculoskeletal: Negative for arthralgias, back pain and neck pain.  Skin: Negative.   Neurological: Negative for dizziness, seizures and speech difficulty.  Psychiatric/Behavioral: Negative for confusion and hallucinations.     Physical Exam Updated Vital Signs BP 130/84 (BP Location: Right Arm)   Pulse 89   Temp 97.6 F (36.4 C) (Oral)   Resp 19   Ht 6\' 2"  (1.88 m)   Wt 79.8 kg   SpO2 98%  BMI 22.60 kg/m   Physical Exam  Constitutional: He is oriented to person, place, and time. He appears well-developed and well-nourished.  Non-toxic appearance.  HENT:  Head: Normocephalic.  Right Ear: Tympanic membrane and external ear normal.  Left Ear: Tympanic membrane and external ear normal.  Eyes: Pupils are equal, round, and reactive to light. EOM and lids are normal.  Neck: Normal range of motion. Neck supple. Carotid bruit is not present.  Cardiovascular: Normal rate, regular rhythm, normal heart sounds, intact distal pulses and normal pulses.  Pulmonary/Chest: Breath sounds normal. No respiratory distress.  Abdominal: Soft. Bowel sounds are normal. There is no tenderness. There is  no guarding.  Musculoskeletal: Normal range of motion.  Lymphadenopathy:       Head (right side): No submandibular adenopathy present.       Head (left side): No submandibular adenopathy present.    He has no cervical adenopathy.  Neurological: He is alert and oriented to person, place, and time. He has normal strength. No cranial nerve deficit or sensory deficit.  Skin: Skin is warm and dry.  Psychiatric: He has a normal mood and affect. His speech is normal.  Nursing note and vitals reviewed.    ED Treatments / Results  Labs (all labs ordered are listed, but only abnormal results are displayed) Labs Reviewed  URINALYSIS, ROUTINE W REFLEX MICROSCOPIC    EKG None  Radiology No results found.  Procedures Procedures (including critical care time)  Medications Ordered in ED Medications - No data to display   Initial Impression / Assessment and Plan / ED Course  I have reviewed the triage vital signs and the nursing notes.  Pertinent labs & imaging results that were available during my care of the patient were reviewed by me and considered in my medical decision making (see chart for details).       Final Clinical Impressions(s) / ED Diagnoses MDM  Vital signs within normal limits.  Urine analysis is negative for infection, kidney stone, or acute problem.  Blood pressure remained stable.  The specific gravity on the urine analysis is at the upper limits of normal, but no actual signs of dehydration.  I have asked patient to increase fluids including water and juices and Gatorade.  I have asked patient to follow-up with Dr. Buelah Manis or return to the emergency department if any changes in condition, problems, or concerns.  Patient is in agreement with this plan.   Final diagnoses:  Urinary problem    ED Discharge Orders    None       Lily Kocher, PA-C 08/26/17 [place], [place], [place] 08/27/17 1548

## 2017-09-06 ENCOUNTER — Ambulatory Visit: Payer: Medicare Other | Admitting: Family Medicine

## 2017-09-06 ENCOUNTER — Ambulatory Visit (INDEPENDENT_AMBULATORY_CARE_PROVIDER_SITE_OTHER): Payer: Medicare Other | Admitting: Physician Assistant

## 2017-09-06 ENCOUNTER — Encounter: Payer: Self-pay | Admitting: Physician Assistant

## 2017-09-06 ENCOUNTER — Other Ambulatory Visit: Payer: Self-pay

## 2017-09-06 VITALS — BP 110/60 | HR 81 | Temp 97.6°F | Resp 16 | Ht 74.0 in | Wt 181.8 lb

## 2017-09-06 DIAGNOSIS — M545 Low back pain, unspecified: Secondary | ICD-10-CM

## 2017-09-06 MED ORDER — CYCLOBENZAPRINE HCL 10 MG PO TABS: 10.0000 mg | ORAL_TABLET | Freq: Three times a day (TID) | ORAL | 0 refills | Status: DC | PRN

## 2017-09-06 MED ORDER — MELOXICAM 7.5 MG PO TABS: 7.5000 mg | ORAL_TABLET | Freq: Every day | ORAL | 0 refills | Status: DC

## 2017-09-06 NOTE — Progress Notes (Signed)
    Patient ID: Jacob Reed MRN: [id number], DOB: [birth year], 52 y.o. Date of Encounter: 09/06/2017, 8:24 AM    Chief Complaint:  Chief Complaint  Patient presents with  . Follow-up  . Back Pain     HPI: 52 y.o. year old male presetns for evaluation of low back pain.   Reports that he has had some problems with his low back pain in the past. Ports that currently he is feeling some discomfort across his low back.  Rubs his hand across low back just above just waistline. 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  Has had no pain down either leg.  No numbness down either leg.  No tingling down either leg.  No weakness in either of his legs or feet.  Treatment at home he has been using some type of "heat arthritis salve" and also has taken some Aleve.  Reviewed that he had lumbar x-ray 06/01/2017 point was negative.     Home Meds:   No outpatient medications prior to visit.   No facility-administered medications prior to visit.     Allergies:  Allergies  Allergen Reactions  . Tramadol Hives      Review of Systems: See HPI for pertinent ROS. All other ROS negative.    Physical Exam: Blood pressure 110/60, pulse 81, temperature 97.6 F (36.4 C), temperature source Oral, resp. rate 16, height 6\' 2"  (1.88 m), weight 82.5 kg, SpO2 98 %., Body mass index is 23.34 kg/m. General:  WM. Appears in no acute distress. Neck: Supple. No thyromegaly. No lymphadenopathy. Lungs: Clear bilaterally to auscultation without wheezes, rales, or rhonchi. Breathing is unlabored. Heart: Regular rhythm. No murmurs, rubs, or gallops. Msk:  Strength and tone normal for age. Mild tenderness with palpation on the bilateral low back about L5 level.  Straight leg raise and hip abduction are normal.  2+ patellar reflexes equal bilaterally. Extremities/Skin: Warm and dry.  Neuro: Alert and oriented X 3. Moves all extremities spontaneously.  Gait is normal. CNII-XII grossly in tact. Psych:  Responds to questions appropriately with a normal affect.     ASSESSMENT AND PLAN:  52 y.o. year old male with  1. Acute bilateral low back pain without sciatica He is to rest back as much as possible.  He can take Mobic once daily with food.  Cautioned that Flexeril may cause drowsiness.  Do not use prior to driving or operating machinery.  If causes significant drowsiness and only take at bedtime. - meloxicam (MOBIC) 7.5 MG tablet; Take 1 tablet (7.5 mg total) by mouth daily.  Dispense: 30 tablet; Refill: 0 - cyclobenzaprine (FLEXERIL) 10 MG tablet; Take 1 tablet (10 mg total) by mouth 3 (three) times daily as needed for muscle spasms.  Dispense: 30 tablet; Refill: 0   Signed, [address] [place], [place], [facility] 09/06/2017 8:24 AM

## 2017-09-24 ENCOUNTER — Other Ambulatory Visit: Payer: Self-pay

## 2017-09-24 ENCOUNTER — Encounter (HOSPITAL_COMMUNITY): Payer: Self-pay | Admitting: *Deleted

## 2017-09-24 ENCOUNTER — Emergency Department (HOSPITAL_COMMUNITY)
Admission: EM | Admit: 2017-09-24 | Discharge: 2017-09-24 | Disposition: A | Discharge status: Home / Self Care | Payer: Medicare Other | Attending: Emergency Medicine | Admitting: Emergency Medicine

## 2017-09-24 DIAGNOSIS — Z79899 Other long term (current) drug therapy: Secondary | ICD-10-CM | POA: Insufficient documentation

## 2017-09-24 DIAGNOSIS — M545 Low back pain: Secondary | ICD-10-CM | POA: Diagnosis not present

## 2017-09-24 DIAGNOSIS — G8929 Other chronic pain: Secondary | ICD-10-CM | POA: Diagnosis not present

## 2017-09-24 DIAGNOSIS — Z87891 Personal history of nicotine dependence: Secondary | ICD-10-CM | POA: Diagnosis not present

## 2017-09-24 DIAGNOSIS — M5440 Lumbago with sciatica, unspecified side: Secondary | ICD-10-CM | POA: Diagnosis not present

## 2017-09-24 MED ORDER — DEXAMETHASONE 4 MG PO TABS: 4.0000 mg | ORAL_TABLET | Freq: Two times a day (BID) | ORAL | 0 refills | Status: DC

## 2017-09-24 MED ORDER — KETOROLAC TROMETHAMINE 30 MG/ML IJ SOLN
30.0000 mg | Freq: Once | INTRAMUSCULAR | Status: AC
  Administered 2017-09-24: 30 mg via INTRAMUSCULAR
  Filled 2017-09-24: qty 1

## 2017-09-24 MED ORDER — DEXAMETHASONE SODIUM PHOSPHATE 10 MG/ML IJ SOLN
10.0000 mg | Freq: Once | INTRAMUSCULAR | Status: AC
  Administered 2017-09-24: 10 mg via INTRAMUSCULAR
  Filled 2017-09-24: qty 1

## 2017-09-24 MED ORDER — HYDROXYZINE HCL 25 MG PO TABS
25.0000 mg | ORAL_TABLET | Freq: Once | ORAL | Status: AC
  Administered 2017-09-24: 25 mg via ORAL
  Filled 2017-09-24: qty 1

## 2017-09-24 MED ORDER — DICLOFENAC SODIUM 75 MG PO TBEC: 75.0000 mg | DELAYED_RELEASE_TABLET | Freq: Two times a day (BID) | ORAL | 0 refills | Status: DC

## 2017-09-24 NOTE — ED Triage Notes (Signed)
Pt c/o back pain; pt unable to pinpoint an actual time of the start of the pain

## 2017-09-24 NOTE — ED Notes (Signed)
Patient left facility without discharge instructions.

## 2017-09-24 NOTE — ED Provider Notes (Signed)
[facility] EMERGENCY DEPARTMENT Provider Note   CSN: [id number] Arrival date & time: 09/24/17  1937     History   Chief Complaint Chief Complaint  Patient presents with  . Back Pain    HPI Jacob Reed is a 52 y.o. male.  Patient is a 52 year old male who presents to the emergency department with a complaint of lower back pain.  The patient states that he is been seen here at this facility concerning his back.  He was prescribed medication.  He went to see his primary physician.  He states that the medicine that they gave him is not working.  He states that the muscle relaxer is not working.  He says that his doctor is out of the office on maternity leave.  He states that the office staff told him that if his pain got bad to come to the emergency room, so that is why he is here.  He denies loss of bowel or bladder function.  He has not had frequent falls.  It is of note that the patient has degenerative disc disease problems and arthritis.  He also suffers from chronic pain in these areas.  The history is provided by the patient.    Past Medical History:  Diagnosis Date  . Anxiety   . Arthritis   . Chronic neck and back pain   . Dental caries   . Sinusitis     Patient Active Problem List   Diagnosis Date Noted  . 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 DDD (degenerative disc disease), lumbar 09/22/2015  . DDD (degenerative disc disease), cervical 09/22/2015  . Insomnia 06/29/2013  . Tinnitus 06/26/2013  . Dizziness and giddiness 04/24/2013  . External hemorrhoid 04/24/2013  . Routine general medical examination at a health care facility 04/24/2013  . Depression with anxiety 11/13/2011  . Arthritis 11/13/2011  . Seasonal allergies 11/13/2011    Past Surgical History:  Procedure Laterality Date  . INGUINAL HERNIA REPAIR Right 10/01/2012   Procedure: HERNIA REPAIR INGUINAL ADULT;  Surgeon: Scherry Ran, MD;  Location: [facility];  Service: General;   Laterality: Right;  . TOOTH EXTRACTION    . VASECTOMY          Home Medications    Prior to Admission medications   Medication Sig Start Date End Date Taking? Authorizing Provider  cyclobenzaprine (FLEXERIL) 10 MG tablet Take 1 tablet (10 mg total) by mouth 3 (three) times daily as needed for muscle spasms. 09/06/17   Orlena Sheldon, PA-C  dexamethasone (DECADRON) 4 MG tablet Take 1 tablet (4 mg total) by mouth 2 (two) times daily with a meal. 09/24/17   Lily Kocher, PA-C  diclofenac (VOLTAREN) 75 MG EC tablet Take 1 tablet (75 mg total) by mouth 2 (two) times daily. 09/24/17   Lily Kocher, PA-C  meloxicam (MOBIC) 7.5 MG tablet Take 1 tablet (7.5 mg total) by mouth daily. 09/06/17 09/06/18  Orlena Sheldon, PA-C    Family History Family History  Problem Relation Age of Onset  . Arthritis Mother   . Heart disease Father   . Arthritis Father     Social History Social History   Tobacco Use  . Smoking status: Former Research scientist (life sciences)  . Smokeless tobacco: Current User    Types: Snuff  Substance Use Topics  . Alcohol use: No  . Drug use: No     Allergies   Tramadol   Review of Systems Review of Systems  Constitutional: Negative  for activity change.       All ROS Neg except as noted in HPI  HENT: Negative for nosebleeds.   Eyes: Negative for photophobia and discharge.  Respiratory: Negative for cough, shortness of breath and wheezing.   Cardiovascular: Negative for chest pain and palpitations.  Gastrointestinal: Negative for abdominal pain and blood in stool.  Genitourinary: Negative for dysuria, frequency and hematuria.  Musculoskeletal: Positive for arthralgias and back pain. Negative for neck pain.  Skin: Negative.   Neurological: Negative for dizziness, seizures and speech difficulty.  Psychiatric/Behavioral: Negative for confusion and hallucinations.     Physical Exam Updated Vital Signs BP 133/82 (BP Location: Right Arm)   Pulse 77   Temp (!) 97.4 F (36.3 C)  (Oral)   Resp 16   Ht 6\' 2"  (1.88 m)   Wt 82.5 kg   SpO2 95%   BMI 23.34 kg/m   Physical Exam  Constitutional: He is oriented to person, place, and time. He appears well-developed and well-nourished.  Non-toxic appearance.  HENT:  Head: Normocephalic.  Right Ear: Tympanic membrane and external ear normal.  Left Ear: Tympanic membrane and external ear normal.  Eyes: Pupils are equal, round, and reactive to light. EOM and lids are normal.  Neck: Normal range of motion. Neck supple. Carotid bruit is not present.  Cardiovascular: Normal rate, regular rhythm, normal heart sounds, intact distal pulses and normal pulses.  Pulmonary/Chest: Breath sounds normal. No respiratory distress.  Abdominal: Soft. Bowel sounds are normal. There is no tenderness. There is no guarding.  Musculoskeletal: Normal range of motion. He exhibits tenderness.       Lumbar back: He exhibits pain.       Back:  There is pain with range of motion of the lower lumbar area.  There is pain with straight leg raise at about 30 degrees.  No sensory deficit appreciated.  Lymphadenopathy:       Head (right side): No submandibular adenopathy present.       Head (left side): No submandibular adenopathy present.    He has no cervical adenopathy.  Neurological: He is alert and oriented to person, place, and time. He has normal strength. No cranial nerve deficit or sensory deficit.  Skin: Skin is warm and dry.  Psychiatric: He has a normal mood and affect. His speech is normal.  Nursing note and vitals reviewed.    ED Treatments / Results  Labs (all labs ordered are listed, but only abnormal results are displayed) Labs Reviewed - No data to display  EKG None  Radiology No results found.  Procedures Procedures (including critical care time)  Medications Ordered in ED Medications  dexamethasone (DECADRON) injection 10 mg (10 mg Intramuscular Given 09/24/17 2120)  ketorolac (TORADOL) 30 MG/ML injection 30 mg (30 mg  Intramuscular Given 09/24/17 2118)  hydrOXYzine (ATARAX/VISTARIL) tablet 25 mg (25 mg Oral Given 09/24/17 2117)     Initial Impression / Assessment and Plan / ED Course  I have reviewed the triage vital signs and the nursing notes.  Pertinent labs & imaging results that were available during my care of the patient were reviewed by me and considered in my medical decision making (see chart for details).       Final Clinical Impressions(s) / ED Diagnoses mdm  Vital signs reviewed.  No gross neurologic deficits noted of the lower extremities.  Pain to palpation and with certain range of motion of the lower back.  No evidence for cauda equina or other emergent changes.  Patient treated in the emergency department with intramuscular Decadron and Toradol.  I have asked the patient to discuss his pain with his primary physician.  I discussed with the patient that the emergency department is not set up for pain control and pain management.  At the time of discharge the patient is amatory without problem.  There are no changes in his neurologic examination.   Final diagnoses:  Chronic low back pain with sciatica, sciatica laterality unspecified, unspecified back pain laterality    ED Discharge Orders         Ordered    diclofenac (VOLTAREN) 75 MG EC tablet  2 times daily     09/24/17 2139    dexamethasone (DECADRON) 4 MG tablet  2 times daily with meals     09/24/17 2139           Lily Kocher, PA-C 09/24/17 2155    Mesner, Corene Cornea, MD 09/24/17 [phone]

## 2017-09-29 ENCOUNTER — Ambulatory Visit: Payer: Medicare Other | Admitting: Family Medicine

## 2017-10-10 ENCOUNTER — Ambulatory Visit: Payer: Medicare Other | Admitting: Family Medicine

## 2017-10-11 ENCOUNTER — Telehealth: Payer: Self-pay | Admitting: Orthopaedic Surgery

## 2017-10-11 NOTE — Telephone Encounter (Signed)
Patient came in to our office to ask for a same day appointment.

## 2017-10-12 ENCOUNTER — Ambulatory Visit (INDEPENDENT_AMBULATORY_CARE_PROVIDER_SITE_OTHER): Payer: Medicare Other | Admitting: Family Medicine

## 2017-10-12 ENCOUNTER — Other Ambulatory Visit: Payer: Self-pay

## 2017-10-12 ENCOUNTER — Encounter: Payer: Self-pay | Admitting: Family Medicine

## 2017-10-12 VITALS — BP 118/64 | HR 90 | Temp 98.3°F | Resp 16 | Ht 74.0 in | Wt 179.0 lb

## 2017-10-12 DIAGNOSIS — M545 Low back pain, unspecified: Secondary | ICD-10-CM

## 2017-10-12 DIAGNOSIS — Z113 Encounter for screening for infections with a predominantly sexual mode of transmission: Secondary | ICD-10-CM

## 2017-10-12 DIAGNOSIS — N179 Acute kidney failure, unspecified: Secondary | ICD-10-CM

## 2017-10-12 DIAGNOSIS — R109 Unspecified abdominal pain: Secondary | ICD-10-CM | POA: Diagnosis not present

## 2017-10-12 DIAGNOSIS — M5136 Other intervertebral disc degeneration, lumbar region: Secondary | ICD-10-CM | POA: Diagnosis not present

## 2017-10-12 DIAGNOSIS — R82998 Other abnormal findings in urine: Secondary | ICD-10-CM | POA: Diagnosis not present

## 2017-10-12 DIAGNOSIS — M1990 Unspecified osteoarthritis, unspecified site: Secondary | ICD-10-CM

## 2017-10-12 DIAGNOSIS — M51369 Other intervertebral disc degeneration, lumbar region without mention of lumbar back pain or lower extremity pain: Secondary | ICD-10-CM

## 2017-10-12 DIAGNOSIS — M5030 Other cervical disc degeneration, unspecified cervical region: Secondary | ICD-10-CM

## 2017-10-12 LAB — URINALYSIS, ROUTINE W REFLEX MICROSCOPIC
Bilirubin Urine: NEGATIVE
Glucose, UA: NEGATIVE
Hgb urine dipstick: NEGATIVE
Leukocytes, UA: NEGATIVE
NITRITE: NEGATIVE
PH: 5.5 (ref 5.0–8.0)
Protein, ur: NEGATIVE
SPECIFIC GRAVITY, URINE: 1.025 (ref 1.001–1.03)

## 2017-10-12 MED ORDER — PREDNISONE 20 MG PO TABS: 40.0000 mg | ORAL_TABLET | Freq: Every day | ORAL | 0 refills | Status: AC

## 2017-10-12 MED ORDER — CYCLOBENZAPRINE HCL 10 MG PO TABS: 10.0000 mg | ORAL_TABLET | Freq: Three times a day (TID) | ORAL | 0 refills | Status: DC | PRN

## 2017-10-12 NOTE — Patient Instructions (Signed)
Back pain - see hand outs below. I put in referral back to ortho for back  Try steroids and muscle relaxers. When steroids are done start taking aleve daily Also use tylenol for pain  I am rechecking your kidney function, will call you with results.  No need for X-rays right now, too recently done, may need MRI.

## 2017-10-12 NOTE — Progress Notes (Signed)
Patient ID: Jacob Reed, male    DOB: [birth year], 52 y.o.   MRN: [id number]  PCP: Alycia Rossetti, MD  Chief Complaint  Patient presents with  . ER F/U    back pain- was given steroids in ER for pain   . Dysuria    concentrated urine    Subjective:   Jacob Reed is a 52 y.o. male, presents to clinic with CC of acute on chronic back pain and follow-up from recent ER visit for same complaint on 09/24/2017.  For said he just woke up with worsening back pain that is located to her right low back right flank has been ongoing intermittent and gradually worsening since he was a teenager diagnosed with arthritis.  He states that he went to the ER, that they "did nothing for him" and his symptoms have not improved.  His pain is exacerbated with any movement, and over-the-counter medications do not help.  He also reports that his urine looks darker than normal but he denies any dysuria, hematuria, penile discharge or abdominal pain, nausea or vomiting.  No new sexual partners has been with one male partner since January 2019.  He does not know if she has had multiple partners or not.  ER visit documentation reviewed personally by me, had reported that none of the medications given including muscle relaxer had worked and reported that he was told to go to the ER.  He is noted on physical exam to have diffuse lumbar paraspinal muscle tenderness to palpation and positive straight leg raise.  He was given Decadron Toradol and hydroxyzine was discharged with Decadron and diclofenac.  No improvement since then and is fairly distressed with worsening flares of pain.  He requests imaging.  Per chart review was last lumbar spinal films were in May 2019.  He has diagnosis of degenerative disc disease per his chart.  And prior to that lumbar spine imaging with plain films July 2016.  He denies any trauma or strain flares occur intermittently but have been gradually worsening for several years. He denies any  incontinence of stool or urine, saddle anesthesia, leg numbness or tingling, he does have intermittent radiation of the right low back and right buttocks pain shoot down his leg but he denies any weakness associated with it. Patient denies any long-term steroid use, no personal history of cancer, or any IV/injected drug use   Patient Active Problem List   Diagnosis Date Noted  . 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 DDD (degenerative disc disease), lumbar 09/22/2015  . DDD (degenerative disc disease), cervical 09/22/2015  . Insomnia 06/29/2013  . Tinnitus 06/26/2013  . Dizziness and giddiness 04/24/2013  . External hemorrhoid 04/24/2013  . Routine general medical examination at a health care facility 04/24/2013  . Depression with anxiety 11/13/2011  . Arthritis 11/13/2011  . Seasonal allergies 11/13/2011     Prior to Admission medications   Not on File     Allergies  Allergen Reactions  . Tramadol Hives     Family History  Problem Relation Age of Onset  . Arthritis Mother   . Heart disease Father   . Arthritis Father      Social History   Socioeconomic History  . Marital status: Legally Separated    Spouse name: Not on file  . Number of children: Not on file  . Years of education: Not on file  . Highest education level: Not on file  Occupational History  .  Not on file  Social Needs  . Financial resource strain: Not on file  . Food insecurity:    Worry: Not on file    Inability: Not on file  . Transportation needs:    Medical: Not on file    Non-medical: Not on file  Tobacco Use  . Smoking status: Former Research scientist (life sciences)  . Smokeless tobacco: Current User    Types: Snuff  Substance and Sexual Activity  . Alcohol use: No  . Drug use: No  . Sexual activity: Yes  Lifestyle  . Physical activity:    Days per week: Not on file    Minutes per session: Not on file  . Stress: Not on file  Relationships  . Social connections:    Talks on phone: Not  on file    Gets together: Not on file    Attends religious service: Not on file    Active member of club or organization: Not on file    Attends meetings of clubs or organizations: Not on file    Relationship status: Not on file  . Intimate partner violence:    Fear of current or ex partner: Not on file    Emotionally abused: Not on file    Physically abused: Not on file    Forced sexual activity: Not on file  Other Topics Concern  . Not on file  Social History Narrative  . Not on file     Review of Systems  Constitutional: Negative.   HENT: Negative.   Eyes: Negative.   Respiratory: Negative.   Cardiovascular: Negative.   Gastrointestinal: Negative.   Endocrine: Negative.   Genitourinary: Negative.   Musculoskeletal: Negative.   Skin: Negative.   Allergic/Immunologic: Negative.   Neurological: Negative.   Hematological: Negative.   Psychiatric/Behavioral: Negative.   All other systems reviewed and are negative.      Objective:    Vitals:   10/12/17 1049  BP: 118/64  Pulse: 90  Resp: 16  Temp: 98.3 F (36.8 C)  TempSrc: Oral  SpO2: 95%  Weight: 179 lb (81.2 kg)  Height: 6\' 2"  (1.88 m)      Physical Exam  Constitutional: He appears well-developed and well-nourished. No distress.  HENT:  Head: Normocephalic and atraumatic.  Nose: Nose normal.  Mouth/Throat: Oropharynx is clear and moist.  Eyes: Conjunctivae are normal. Right eye exhibits no discharge. Left eye exhibits no discharge.  Neck: Normal range of motion. No tracheal deviation present.  Cardiovascular: Normal rate, regular rhythm, normal heart sounds and intact distal pulses.  Pulmonary/Chest: Effort normal. No stridor. No respiratory distress.  Abdominal: Soft. Bowel sounds are normal. He exhibits no distension and no mass. There is no tenderness. There is no guarding.  No CVA tenderness  Musculoskeletal: He exhibits tenderness. He exhibits no edema.       Lumbar back: He exhibits decreased  range of motion and tenderness. He exhibits no bony tenderness, no swelling, no edema, no deformity, no laceration, no pain and no spasm.       Back:  No midline tenderness from cervical to lumbar spine, no step-offs, decreased flexion extension of the lower back, normal range of motion of bilateral hips Tenderness to palpation to right lumbar paraspinal muscles and to right SI joint and right buttocks with positive straight leg raise on the right  Neurological: He is alert. No cranial nerve deficit or sensory deficit. He exhibits normal muscle tone. Coordination normal.  Normal sensation to light touch in bilateral lower extremities 5  out of 5 dorsiflexion plantarflexion bilaterally Antalgic gait  Skin: Skin is warm and dry. No rash noted. He is not diaphoretic.  Psychiatric: He has a normal mood and affect. His behavior is normal.  Nursing note and vitals reviewed.         Assessment & Plan:      ICD-10-CM   1. Right-sided low back pain without sciatica, unspecified chronicity M54.5 cyclobenzaprine (FLEXERIL) 10 MG tablet    predniSONE (DELTASONE) 20 MG tablet    Ambulatory referral to Orthopedic Surgery  2. Dark urine R82.998 Urinalysis, Routine w reflex microscopic    C. trachomatis/N. gonorrhoeae RNA    Trichomonas vaginalis RNA, Ql,Males  3. Screen for STD (sexually transmitted disease) Z11.3 C. trachomatis/N. gonorrhoeae RNA    Trichomonas vaginalis RNA, Ql,Males  4. Right flank pain Y81.4 COMPLETE METABOLIC PANEL WITH GFR  5. DDD (degenerative disc disease), lumbar M51.36 Ambulatory referral to Orthopedic Surgery  6. DDD (degenerative disc disease), cervical M50.30 Ambulatory referral to Orthopedic Surgery  7. Arthritis M19.90 Ambulatory referral to Orthopedic Surgery  8. AKI (acute kidney injury) ([place]) G81.8 COMPLETE METABOLIC PANEL WITH GFR    With acute on chronic back pain, would like to rule out any kind of kidney infection or STD contributing to back pain with some  vague urinary complaints however I doubt this, will refer to orthopedic surgery or spinal specialist long history of back pain and degenerative disc disease.  Does have some sciatica, which his exam was pertinent for otherwise no concerning findings on his exam and no red flags related to back pain.  Treat with steroid burst muscle relaxers.  UA in clinic was unremarkable, STD testing added onto this for gonorrhea, chlamydia and trichomonas.  Lab work showed mildly elevated serum creatinine, will repeat today.  Delsa Grana, PA-C 10/12/17 10:57 AM  Results for orders placed or performed in visit on 10/12/17  C. trachomatis/N. gonorrhoeae RNA  Result Value Ref Range   C. trachomatis RNA, TMA NOT DETECTED NOT DETECT   N. gonorrhoeae RNA, TMA NOT DETECTED NOT DETECT  Urinalysis, Routine w reflex microscopic  Result Value Ref Range   Color, Urine YELLOW YELLOW   APPearance CLEAR CLEAR   Specific Gravity, Urine 1.025 1.001 - 1.03   pH 5.5 5.0 - 8.0   Glucose, UA NEGATIVE NEGATIVE   Bilirubin Urine NEGATIVE NEGATIVE   Ketones, ur 1+ (A) NEGATIVE   Hgb urine dipstick NEGATIVE NEGATIVE   Protein, ur NEGATIVE NEGATIVE   Nitrite NEGATIVE NEGATIVE   Leukocytes, UA NEGATIVE NEGATIVE  COMPLETE METABOLIC PANEL WITH GFR  Result Value Ref Range   Glucose, Bld 99 65 - 99 mg/dL   BUN 17 7 - 25 mg/dL   Creat 1.26 0.70 - 1.33 mg/dL   GFR, Est Non African American 65 > OR = 60 mL/min/1.31m2   GFR, Est African American 75 > OR = 60 mL/min/1.106m2   BUN/Creatinine Ratio NOT APPLICABLE 6 - 22 (calc)   Sodium 138 135 - 146 mmol/L   Potassium 4.1 3.5 - 5.3 mmol/L   Chloride 106 98 - 110 mmol/L   CO2 22 20 - 32 mmol/L   Calcium 9.1 8.6 - 10.3 mg/dL   Total Protein 6.7 6.1 - 8.1 g/dL   Albumin 4.5 3.6 - 5.1 g/dL   Globulin 2.2 1.9 - 3.7 g/dL (calc)   AG Ratio 2.0 1.0 - 2.5 (calc)   Total Bilirubin 0.6 0.2 - 1.2 mg/dL   Alkaline phosphatase (APISO) 92 40 - 115 U/L  AST 15 10 - 35 U/L   ALT 11 9 - 46  U/L  Trichomonas vaginalis RNA, Ql,Males  Result Value Ref Range   Trichomonas vaginalis RNA NOT DETECTED NOT DETECT

## 2017-10-12 NOTE — Telephone Encounter (Signed)
Patient aware we did not have a same day appointment available. States he has a scheduled appointment with his primary care doctor tomorrow, 10/12/17. Aware if needed, primary care will refer back to our clinic.

## 2017-10-13 LAB — COMPLETE METABOLIC PANEL WITH GFR
AG Ratio: 2 (calc) (ref 1.0–2.5)
ALT: 11 U/L (ref 9–46)
AST: 15 U/L (ref 10–35)
Albumin: 4.5 g/dL (ref 3.6–5.1)
Alkaline phosphatase (APISO): 92 U/L (ref 40–115)
BUN: 17 mg/dL (ref 7–25)
CO2: 22 mmol/L (ref 20–32)
Calcium: 9.1 mg/dL (ref 8.6–10.3)
Chloride: 106 mmol/L (ref 98–110)
Creat: 1.26 mg/dL (ref 0.70–1.33)
GFR, EST NON AFRICAN AMERICAN: 65 mL/min/{1.73_m2} (ref >=60)
GFR, Est African American: 75 mL/min/{1.73_m2} (ref >=60)
GLOBULIN: 2.2 g/dL (ref 1.9–3.7)
Glucose, Bld: 99 mg/dL (ref 65–99)
Potassium: 4.1 mmol/L (ref 3.5–5.3)
SODIUM: 138 mmol/L (ref 135–146)
Total Bilirubin: 0.6 mg/dL (ref 0.2–1.2)
Total Protein: 6.7 g/dL (ref 6.1–8.1)

## 2017-10-16 LAB — TRICHOMONAS VAGINALIS RNA, QL,MALES: Trichomonas vaginalis RNA: NOT DETECTED

## 2017-10-16 LAB — C. TRACHOMATIS/N. GONORRHOEAE RNA
C. TRACHOMATIS RNA, TMA: NOT DETECTED
N. gonorrhoeae RNA, TMA: NOT DETECTED

## 2017-10-19 ENCOUNTER — Ambulatory Visit (INDEPENDENT_AMBULATORY_CARE_PROVIDER_SITE_OTHER): Payer: Medicare Other | Admitting: Orthopaedic Surgery

## 2017-10-19 ENCOUNTER — Telehealth: Payer: Self-pay | Admitting: Radiology

## 2017-10-19 ENCOUNTER — Encounter: Payer: Self-pay | Admitting: Orthopaedic Surgery

## 2017-10-19 VITALS — BP 115/73 | HR 72 | Ht 74.0 in | Wt 182.0 lb

## 2017-10-19 DIAGNOSIS — G8929 Other chronic pain: Secondary | ICD-10-CM | POA: Diagnosis not present

## 2017-10-19 DIAGNOSIS — M545 Low back pain, unspecified: Secondary | ICD-10-CM

## 2017-10-19 MED ORDER — ACETAMINOPHEN-CODEINE #3 300-30 MG PO TABS: 1.0000 | ORAL_TABLET | ORAL | 0 refills | Status: AC | PRN

## 2017-10-19 NOTE — Progress Notes (Signed)
Patient Jacob Reed, male DOB:[birth year], 52 y.o. QZE:[id number]  Chief Complaint  Patient presents with  . Back Pain    HPI  ERNESTINE LANGWORTHY is a 52 y.o. male who has increasing pain of the lumbar spine with no sciatica.  He has had negative x-rays.  He has no weakness.  He has no trauma.  He has taken ibuprofen and Aleve with no help.  He has used ice and heat.  I will get MRI.     Body mass index is 23.37 kg/m.  ROS  Review of Systems  Respiratory: Negative for cough and shortness of breath.   Cardiovascular: Negative for chest pain and leg swelling.  Musculoskeletal: Positive for arthralgias, back pain and neck pain.  Psychiatric/Behavioral: The patient is nervous/anxious.   All other systems reviewed and are negative.   All other systems reviewed and are negative.  The following is a summary of the past history medically, past history surgically, known current medicines, social history and family history.  This information is gathered electronically by the computer from prior information and documentation.  I review this each visit and have found including this information at this point in the chart is beneficial and informative.    Past Medical History:  Diagnosis Date  . Anxiety   . Arthritis   . Chronic neck and back pain   . Dental caries   . Sinusitis     Past Surgical History:  Procedure Laterality Date  . INGUINAL HERNIA REPAIR Right 10/01/2012   Procedure: HERNIA REPAIR INGUINAL ADULT;  Surgeon: Scherry Ran, MD;  Location: [facility];  Service: General;  Laterality: Right;  . TOOTH EXTRACTION    . VASECTOMY      Family History  Problem Relation Age of Onset  . Arthritis Mother   . Heart disease Father   . Arthritis Father     Social History Social History   Tobacco Use  . Smoking status: Former Research scientist (life sciences)  . Smokeless tobacco: Current User    Types: Snuff  Substance Use Topics  . Alcohol use: No  . Drug use: No    Allergies  Allergen  Reactions  . Tramadol Hives    Current Outpatient Medications  Medication Sig Dispense Refill  . cyclobenzaprine (FLEXERIL) 10 MG tablet Take 1 tablet (10 mg total) by mouth 3 (three) times daily as needed for muscle spasms. 30 tablet 0  . acetaminophen-codeine (TYLENOL #3) 300-30 MG tablet Take 1 tablet by mouth every 4 (four) hours as needed for up to 5 days for moderate pain. 30 tablet 0   No current facility-administered medications for this visit.      Physical Exam  Blood pressure 115/73, pulse 72, height 6\' 2"  (1.88 m), weight 182 lb (82.6 kg).  Constitutional: overall normal hygiene, normal nutrition, well developed, normal grooming, normal body habitus. Assistive device:none  Musculoskeletal: gait and station Limp none, muscle tone and strength are normal, no tremors or atrophy is present.  .  Neurological: coordination overall normal.  Deep tendon reflex/nerve stretch intact.  Sensation normal.  Cranial nerves II-XII intact.   Skin:   Normal overall no scars, lesions, ulcers or rashes. No psoriasis.  Psychiatric: Alert and oriented x 3.  Recent memory intact, remote memory unclear.  Normal mood and affect. Well groomed.  Good eye contact.  Cardiovascular: overall no swelling, no varicosities, no edema bilaterally, normal temperatures of the legs and arms, no clubbing, cyanosis and good capillary refill.  Lymphatic: palpation is normal.  Spine/Pelvis examination:  Inspection:  Overall, sacoiliac joint benign and hips nontender; without crepitus or defects.   Thoracic spine inspection: Alignment normal without kyphosis present   Lumbar spine inspection:  Alignment  with normal lumbar lordosis, without scoliosis apparent.   Thoracic spine palpation:  without tenderness of spinal processes   Lumbar spine palpation: without tenderness of lumbar area; without tightness of lumbar muscles    Range of Motion:   Lumbar flexion, forward flexion is normal without pain or  tenderness    Lumbar extension is full without pain or tenderness   Left lateral bend is normal without pain or tenderness   Right lateral bend is normal without pain or tenderness   Straight leg raising is normal  Strength & tone: normal   Stability overall normal stability All other systems reviewed and are negative   The patient has been educated about the nature of the problem(s) and counseled on treatment options.  The patient appeared to understand what I have discussed and is in agreement with it.  Encounter Diagnosis  Name Primary?  . Chronic midline low back pain without sciatica Yes    PLAN Call if any problems.  Precautions discussed.  Continue current medications.   Return to clinic MRI lumbar spine   Tylenol # 3 given.  I have reviewed the [facility] web site prior to prescribing narcotic medicine for this patient.      Electronically Signed Sanjuana Kava, MD 10/10/201910:39 AM

## 2017-10-19 NOTE — Telephone Encounter (Signed)
Called patient about MRI appointments and advised of time, made follow up with Dr Luna Glasgow for review.

## 2017-10-23 ENCOUNTER — Ambulatory Visit (HOSPITAL_COMMUNITY)
Admission: RE | Admit: 2017-10-23 | Discharge: 2017-10-23 | Disposition: A | Discharge status: Home / Self Care | Payer: Medicare Other | Source: Ambulatory Visit | Attending: Orthopaedic Surgery | Admitting: Orthopaedic Surgery

## 2017-10-23 DIAGNOSIS — G8929 Other chronic pain: Secondary | ICD-10-CM | POA: Insufficient documentation

## 2017-10-23 DIAGNOSIS — M48061 Spinal stenosis, lumbar region without neurogenic claudication: Secondary | ICD-10-CM | POA: Insufficient documentation

## 2017-10-23 DIAGNOSIS — M545 Low back pain: Secondary | ICD-10-CM

## 2017-10-23 DIAGNOSIS — M8938 Hypertrophy of bone, other site: Secondary | ICD-10-CM | POA: Diagnosis not present

## 2017-10-23 DIAGNOSIS — M5126 Other intervertebral disc displacement, lumbar region: Secondary | ICD-10-CM | POA: Insufficient documentation

## 2017-10-24 ENCOUNTER — Ambulatory Visit (INDEPENDENT_AMBULATORY_CARE_PROVIDER_SITE_OTHER): Payer: Medicare Other | Admitting: Orthopaedic Surgery

## 2017-10-24 ENCOUNTER — Encounter: Payer: Self-pay | Admitting: Orthopaedic Surgery

## 2017-10-24 VITALS — BP 110/64 | HR 86 | Ht 74.0 in | Wt 180.0 lb

## 2017-10-24 DIAGNOSIS — G8929 Other chronic pain: Secondary | ICD-10-CM

## 2017-10-24 DIAGNOSIS — M545 Low back pain: Secondary | ICD-10-CM

## 2017-10-24 NOTE — Progress Notes (Signed)
Patient Jacob Reed, male DOB:Jan 06, 1966, 52 y.o. DZH:[id number]  Chief Complaint  Patient presents with  . Back Pain    HPI  Jacob Reed is a 52 y.o. male who has continued pain of the lower back and pain to the right hip and leg.  He had a MRI which showed: IMPRESSION: 1. 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 Protruding disc closely approximates and could potentially irritate either of the descending L5 nerve roots. 2. Mild disc bulge with facet hypertrophy at L3-4 with resultant mild canal and left lateral recess narrowing.  I will have a neurosurgeon see him and evaluate.  The patient is agreeable.    Body mass index is 23.11 kg/m.  ROS  Review of Systems  Respiratory: Negative for cough and shortness of breath.   Cardiovascular: Negative for chest pain and leg swelling.  Musculoskeletal: Positive for arthralgias, back pain and neck pain.  Psychiatric/Behavioral: The patient is nervous/anxious.   All other systems reviewed and are negative.   All other systems reviewed and are negative.  The following is a summary of the past history medically, past history surgically, known current medicines, social history and family history.  This information is gathered electronically by the computer from prior information and documentation.  I review this each visit and have found including this information at this point in the chart is beneficial and informative.    Past Medical History:  Diagnosis Date  . Anxiety   . Arthritis   . Chronic neck and back pain   . Dental caries   . Sinusitis     Past Surgical History:  Procedure Laterality Date  . INGUINAL HERNIA REPAIR Right 10/01/2012   Procedure: HERNIA REPAIR INGUINAL ADULT;  Surgeon: Scherry Ran, MD;  Location: [facility];  Service: General;  Laterality: Right;  . TOOTH EXTRACTION    . VASECTOMY      Family History  Problem Relation Age of Onset  .  Arthritis Mother   . Heart disease Father   . Arthritis Father     Social History Social History   Tobacco Use  . Smoking status: Former Research scientist (life sciences)  . Smokeless tobacco: Current User    Types: Snuff  Substance Use Topics  . Alcohol use: No  . Drug use: No    Allergies  Allergen Reactions  . Tramadol Hives    Current Outpatient Medications  Medication Sig Dispense Refill  . acetaminophen-codeine (TYLENOL #3) 300-30 MG tablet Take 1 tablet by mouth every 4 (four) hours as needed for up to 5 days for moderate pain. 30 tablet 0  . cyclobenzaprine (FLEXERIL) 10 MG tablet Take 1 tablet (10 mg total) by mouth 3 (three) times daily as needed for muscle spasms. 30 tablet 0   No current facility-administered medications for this visit.      Physical Exam  Blood pressure 110/64, pulse 86, height 6\' 2"  (1.88 m), weight 180 lb (81.6 kg).  Constitutional: overall normal hygiene, normal nutrition, well developed, normal grooming, normal body habitus. Assistive device:none  Musculoskeletal: gait and station Limp right, muscle tone and strength are normal, no tremors or atrophy is present.  .  Neurological: coordination overall normal.  Deep tendon reflex/nerve stretch intact.  Sensation normal.  Cranial nerves II-XII intact.   Skin:   Normal overall no scars, lesions, ulcers or rashes. No psoriasis.  Psychiatric: Alert and oriented x 3.  Recent memory intact, remote memory unclear.  Normal mood and  affect. Well groomed.  Good eye contact.  Cardiovascular: overall no swelling, no varicosities, no edema bilaterally, normal temperatures of the legs and arms, no clubbing, cyanosis and good capillary refill.  Spine/Pelvis examination:  Inspection:  Overall, sacoiliac joint benign and hips nontender; without crepitus or defects.   Thoracic spine inspection: Alignment normal without kyphosis present   Lumbar spine inspection:  Alignment  with normal lumbar lordosis, without scoliosis  apparent.   Thoracic spine palpation:  without tenderness of spinal processes   Lumbar spine palpation: without tenderness of lumbar area; without tightness of lumbar muscles    Range of Motion:   Lumbar flexion, forward flexion is normal without pain or tenderness    Lumbar extension is full without pain or tenderness   Left lateral bend is normal without pain or tenderness   Right lateral bend is normal without pain or tenderness   Straight leg raising is normal  Strength & tone: normal   Stability overall normal stability Lymphatic: palpation is normal.  All other systems reviewed and are negative   The patient has been educated about the nature of the problem(s) and counseled on treatment options.  The patient appeared to understand what I have discussed and is in agreement with it.  Diagnosis: Lower back pain with right sided sciatica.  PLAN Call if any problems.  Precautions discussed.  Continue current medications.   Return to clinic to see neurosurgeon   Electronically Signed Sanjuana Kava, MD 10/15/201910:44 AM

## 2017-10-25 ENCOUNTER — Telehealth: Payer: Self-pay | Admitting: Orthopaedic Surgery

## 2017-10-25 NOTE — Telephone Encounter (Signed)
No

## 2017-10-25 NOTE — Telephone Encounter (Signed)
Patient called to ask if "Dr Luna Glasgow would give me just 1 more refill of pain medicine until the appointment in [facility] it may be hard to get to [place] due to vehicle not running right now.   Please advise.

## 2017-10-25 NOTE — Telephone Encounter (Signed)
Called back to patient - relayed. Voiced understanding.

## 2017-10-27 ENCOUNTER — Telehealth: Payer: Self-pay | Admitting: Family Medicine

## 2017-10-27 DIAGNOSIS — Z1211 Encounter for screening for malignant neoplasm of colon: Secondary | ICD-10-CM

## 2017-10-27 NOTE — Telephone Encounter (Signed)
Yes great thank you

## 2017-10-27 NOTE — Telephone Encounter (Signed)
Ok to place referral.

## 2017-10-27 NOTE — Telephone Encounter (Signed)
Pt states he is returning call to let provider know he would like to go ahead and schedule colonoscopy. Pt request office schedule and call him with appt date and time.

## 2017-10-30 NOTE — Telephone Encounter (Signed)
Referral to GI placed

## 2017-11-15 ENCOUNTER — Other Ambulatory Visit: Payer: Self-pay

## 2017-11-15 ENCOUNTER — Emergency Department (HOSPITAL_COMMUNITY)
Admission: EM | Admit: 2017-11-15 | Discharge: 2017-11-15 | Discharge status: Against Medical Advice | Payer: Medicare Other | Attending: Emergency Medicine | Admitting: Emergency Medicine

## 2017-11-15 ENCOUNTER — Encounter (HOSPITAL_COMMUNITY): Payer: Self-pay | Admitting: Emergency Medicine

## 2017-11-15 DIAGNOSIS — X58XXXA Exposure to other specified factors, initial encounter: Secondary | ICD-10-CM | POA: Diagnosis not present

## 2017-11-15 DIAGNOSIS — Z5321 Procedure and treatment not carried out due to patient leaving prior to being seen by health care provider: Secondary | ICD-10-CM | POA: Diagnosis not present

## 2017-11-15 DIAGNOSIS — Y939 Activity, unspecified: Secondary | ICD-10-CM | POA: Insufficient documentation

## 2017-11-15 DIAGNOSIS — Y929 Unspecified place or not applicable: Secondary | ICD-10-CM | POA: Insufficient documentation

## 2017-11-15 DIAGNOSIS — Y999 Unspecified external cause status: Secondary | ICD-10-CM | POA: Insufficient documentation

## 2017-11-15 DIAGNOSIS — S91101A Unspecified open wound of right great toe without damage to nail, initial encounter: Secondary | ICD-10-CM | POA: Insufficient documentation

## 2017-11-15 NOTE — ED Triage Notes (Signed)
Patient complaining of wound to bottom of right big toe x 1 week.

## 2017-11-15 NOTE — ED Triage Notes (Signed)
Registration called to inform triage that pt left due to not wanting to wait for the next available room.

## 2017-11-16 ENCOUNTER — Ambulatory Visit (INDEPENDENT_AMBULATORY_CARE_PROVIDER_SITE_OTHER): Payer: Self-pay

## 2017-11-16 DIAGNOSIS — Z1211 Encounter for screening for malignant neoplasm of colon: Secondary | ICD-10-CM

## 2017-11-16 MED ORDER — NA SULFATE-K SULFATE-MG SULF 17.5-3.13-1.6 GM/177ML PO SOLN: 1.0000 | ORAL | 0 refills | Status: DC

## 2017-11-16 NOTE — Progress Notes (Addendum)
Gastroenterology Pre-Procedure Review  Request Date:11/16/17 Requesting Physician: Dr.Harnett- no previous tcs  PATIENT REVIEW QUESTIONS: The patient responded to the following health history questions as indicated:    1. Diabetes Melitis: no 2. Joint replacements in the past 12 months: no 3. Major health problems in the past 3 months: yes (back problems- he is going to see ortho next week and will call if there are any upcoming plans for surgery,etc) 4. Has an artificial valve or MVP: no 5. Has a defibrillator: no 6. Has been advised in past to take antibiotics in advance of a procedure like teeth cleaning: no 7. Family history of colon cancer: no  8. Alcohol Use: no 9. History of sleep apnea: no  10. History of coronary artery or other vascular stents placed within the last 12 months: no 11. History of any prior anesthesia complications: no    MEDICATIONS & ALLERGIES:    Patient reports the following regarding taking any blood thinners:   Plavix? no Aspirin? no Coumadin? no Brilinta? no Xarelto? no Eliquis? no Pradaxa? no Savaysa? no Effient? no  Patient confirms/reports the following medications:  Current Outpatient Medications  Medication Sig Dispense Refill  . Acetaminophen (TYLENOL GO TABS EXTRA STRENGTH PO) Take by mouth as needed.     No current facility-administered medications for this visit.     Patient confirms/reports the following allergies:  Allergies  Allergen Reactions  . Tramadol Hives    No orders of the defined types were placed in this encounter.   AUTHORIZATION INFORMATION Primary Insurance: [employer] medicare,  [place] #:[id number] Pre-Cert / Josem Kaufmann required: no Pre-Cert / Auth #:  SCHEDULE INFORMATION: Procedure has been scheduled as follows:  Date: 01/22/18, Time: 9:30 Location: [facility] Dr.Rourk  This Gastroenterology Pre-Precedure Review Form is being routed to the following provider(s): Neil Crouch, PA

## 2017-11-16 NOTE — Progress Notes (Signed)
Ok to schedule.

## 2017-11-16 NOTE — Patient Instructions (Addendum)
Jacob Reed  1965/12/19 MRN: [id number]     Procedure Date: 01/12/2018 Time to register: 1:15 PM Place to register: [facility] Procedure Time: 2:15 PM Scheduled provider: Manus Rudd, MD    PREPARATION FOR COLONOSCOPY WITH SUPREP BOWEL PREP KIT  Note: Suprep Bowel Prep Kit is a split-dose (2day) regimen. Consumption of BOTH 6-ounce bottles is required for a complete prep.  Please notify us immediately if you are diabetic, take iron supplements, or if you are on Coumadin or any other blood thinners.                                                                                                                                                1 DAY BEFORE PROCEDURE:  DATE: 01/11/2018     clear liquids the entire day - NO SOLID FOOD.   At 6:00pm: Complete steps 1 through 4 below, using ONE (1) 6-ounce bottle, before going to bed. Step 1:  Pour ONE (1) 6-ounce bottle of SUPREP liquid into the mixing container.  Step 2:  Add cool drinking water to the 16 ounce line on the container and mix.  Note: Dilute the solution concentrate as directed prior to use. Step 3:  DRINK ALL the liquid in the container. Step 4:  You MUST drink an additional two (2) or more 16 ounce containers of water over the next one (1) hour.   Continue clear liquids.  DAY OF PROCEDURE:   DATE: 01/12/2018  If you take medications for your heart, blood pressure, or breathing, you may take these medications.   5 hours before your procedure at 9:30 AM Step 1:  Pour ONE (1) 6-ounce bottle of SUPREP liquid into the mixing container.  Step 2:  Add cool drinking water to the 16 ounce line on the container and mix.  Note: Dilute the solution concentrate as directed prior to use. Step 3:  DRINK ALL the liquid in the container. Step 4:  You MUST drink an additional two (2) or more 16 ounce containers of water over the next one (1) hour. You MUST complete the final glass of water at least 3 hours before your  colonoscopy.  Nothing by mouth past 11:00 AM  You may take your morning medications with sip of water unless we have instructed otherwise.    Please see below for Dietary Information.  CLEAR LIQUIDS INCLUDE:  Water Jello (NOT red in color)   Ice Popsicles (NOT red in color)   Tea (sugar ok, no milk/cream) Powdered fruit flavored drinks  Coffee (sugar ok, no milk/cream) Gatorade/ Lemonade/ Kool-Aid  (NOT red in color)   Juice: apple, white grape, white cranberry Soft drinks  Clear bullion, consomme, broth (fat free beef/chicken/vegetable)  Carbonated beverages (any kind)  Strained chicken noodle soup Hard Candy   Remember: Clear liquids are liquids that will allow you  to see your fingers on the other side of a clear glass. Be sure liquids are NOT red in color, and not cloudy, but CLEAR.  DO NOT EAT OR DRINK ANY OF THE FOLLOWING:  Dairy products of any kind   Cranberry juice Tomato juice / V8 juice   Grapefruit juice Orange juice     Red grape juice  Do not eat any solid foods, including such foods as: cereal, oatmeal, yogurt, fruits, vegetables, creamed soups, eggs, bread, crackers, pureed foods in a blender, etc.   HELPFUL HINTS FOR DRINKING PREP SOLUTION:   Make sure prep is extremely cold. Mix and refrigerate the the morning of the prep. You may also put in the freezer.   You may try mixing some Crystal Light or Country Time Lemonade if you prefer. Mix in small amounts; add more if necessary.  Try drinking through a straw  Rinse mouth with water or a mouthwash between glasses, to remove after-taste.  Try sipping on a cold beverage /ice/ popsicles between glasses of prep.  Place a piece of sugar-free hard candy in mouth between glasses.  If you become nauseated, try consuming smaller amounts, or stretch out the time between glasses. Stop for 30-60 minutes, then slowly start back drinking.     OTHER INSTRUCTIONS  You will need a responsible adult at least 53 years of  age to accompany you and drive you home. This person must remain in the waiting room during your procedure. The hospital will cancel your procedure if you do not have a responsible adult with you.   1. Wear loose fitting clothing that is easily removed. 2. Leave jewelry and other valuables at home.  3. Remove all body piercing jewelry and leave at home. 4. Total time from sign-in until discharge is approximately 2-3 hours. 5. You should go home directly after your procedure and rest. You can resume normal activities the day after your procedure. 6. The day of your procedure you should not:  Drive  Make legal decisions  Operate machinery  Drink alcohol  Return to work   You may call the office (Dept: [phone]) before 5:00pm, or page the doctor on call [phone]) after 5:00pm, for further instructions, if necessary.   Insurance Information YOU WILL NEED TO CHECK WITH YOUR INSURANCE COMPANY FOR THE BENEFITS OF COVERAGE YOU HAVE FOR THIS PROCEDURE.  UNFORTUNATELY, NOT ALL INSURANCE COMPANIES HAVE BENEFITS TO COVER ALL OR PART OF THESE TYPES OF PROCEDURES.  IT IS YOUR RESPONSIBILITY TO CHECK YOUR BENEFITS, HOWEVER, WE WILL BE GLAD TO ASSIST YOU WITH ANY CODES YOUR INSURANCE COMPANY MAY NEED.    PLEASE NOTE THAT MOST INSURANCE COMPANIES WILL NOT COVER A SCREENING COLONOSCOPY FOR PEOPLE UNDER THE AGE OF 50  IF YOU HAVE BCBS INSURANCE, YOU MAY HAVE BENEFITS FOR A SCREENING COLONOSCOPY BUT IF POLYPS ARE FOUND THE DIAGNOSIS WILL CHANGE AND THEN YOU MAY HAVE A DEDUCTIBLE THAT WILL NEED TO BE MET. SO PLEASE MAKE SURE YOU CHECK YOUR BENEFITS FOR A SCREENING COLONOSCOPY AS WELL AS A DIAGNOSTIC COLONOSCOPY.

## 2017-11-21 ENCOUNTER — Ambulatory Visit: Payer: Medicare Other | Admitting: Orthopaedic Surgery

## 2017-11-22 ENCOUNTER — Ambulatory Visit (INDEPENDENT_AMBULATORY_CARE_PROVIDER_SITE_OTHER): Payer: Medicare Other | Admitting: Orthopaedic Surgery

## 2017-11-22 ENCOUNTER — Encounter: Payer: Self-pay | Admitting: Orthopaedic Surgery

## 2017-11-22 VITALS — BP 136/98 | HR 75 | Ht 74.0 in | Wt 185.0 lb

## 2017-11-22 DIAGNOSIS — G8929 Other chronic pain: Secondary | ICD-10-CM | POA: Diagnosis not present

## 2017-11-22 DIAGNOSIS — M545 Low back pain: Secondary | ICD-10-CM | POA: Diagnosis not present

## 2017-11-22 MED ORDER — ACETAMINOPHEN-CODEINE #3 300-30 MG PO TABS: ORAL_TABLET | ORAL | 0 refills | Status: DC

## 2017-11-22 NOTE — Progress Notes (Signed)
Patient Jacob Reed, male DOB:03/14/1965, 52 y.o. ZES:[id number]  Chief Complaint  Patient presents with  . Back Pain    Lower back pain. Would like to know what MRI means.    HPI  Jacob Reed is a 52 y.o. male who has continued lower back pain.  He was not able to go to neurosurgeon secondary to transportation problems.  He has rearranged things and has an appointment for November 18, next Monday. He still has pain and has some days worse than other days.  He has no new trauma.  He has no paresthesias or weakness.   Body mass index is 23.75 kg/m.  ROS  Review of Systems  Respiratory: Negative for cough and shortness of breath.   Cardiovascular: Negative for chest pain and leg swelling.  Musculoskeletal: Positive for arthralgias, back pain and neck pain.  Psychiatric/Behavioral: The patient is nervous/anxious.   All other systems reviewed and are negative.   All other systems reviewed and are negative.  The following is a summary of the past history medically, past history surgically, known current medicines, social history and family history.  This information is gathered electronically by the computer from prior information and documentation.  I review this each visit and have found including this information at this point in the chart is beneficial and informative.    Past Medical History:  Diagnosis Date  . Anxiety   . Arthritis   . Chronic neck and back pain   . Dental caries   . Sinusitis     Past Surgical History:  Procedure Laterality Date  . INGUINAL HERNIA REPAIR Right 10/01/2012   Procedure: HERNIA REPAIR INGUINAL ADULT;  Surgeon: Scherry Ran, MD;  Location: [facility];  Service: General;  Laterality: Right;  . TOOTH EXTRACTION    . VASECTOMY      Family History  Problem Relation Age of Onset  . Arthritis Mother   . Heart disease Father   . Arthritis Father     Social History Social History   Tobacco Use  . Smoking status: Former Research scientist (life sciences)  .  Smokeless tobacco: Current User    Types: Snuff  Substance Use Topics  . Alcohol use: No  . Drug use: No    Allergies  Allergen Reactions  . Tramadol Hives    Current Outpatient Medications  Medication Sig Dispense Refill  . Acetaminophen (TYLENOL GO TABS EXTRA STRENGTH PO) Take by mouth as needed.    Marland Kitchen acetaminophen-codeine (TYLENOL #3) 300-30 MG tablet One every six hours as needed for pain. 28 tablet 0  . Na Sulfate-K Sulfate-Mg Sulf (SUPREP BOWEL PREP KIT) 17.5-3.13-1.6 GM/177ML SOLN Take 1 kit by mouth as directed. 1 Bottle 0   No current facility-administered medications for this visit.      Physical Exam  Blood pressure (!) 136/98, pulse 75, height '6\' 2"'  (1.88 m), weight 185 lb (83.9 kg).  Constitutional: overall normal hygiene, normal nutrition, well developed, normal grooming, normal body habitus. Assistive device:none  Musculoskeletal: gait and station Limp none, muscle tone and strength are normal, no tremors or atrophy is present.  .  Neurological: coordination overall normal.  Deep tendon reflex/nerve stretch intact.  Sensation normal.  Cranial nerves II-XII intact.   Skin:   Normal overall no scars, lesions, ulcers or rashes. No psoriasis.  Psychiatric: Alert and oriented x 3.  Recent memory intact, remote memory unclear.  Normal mood and affect. Well groomed.  Good eye contact.  Cardiovascular: overall no swelling, no varicosities,  no edema bilaterally, normal temperatures of the legs and arms, no clubbing, cyanosis and good capillary refill.  Lymphatic: palpation is normal.  Spine/Pelvis examination:  Inspection:  Overall, sacoiliac joint benign and hips nontender; without crepitus or defects.   Thoracic spine inspection: Alignment normal without kyphosis present   Lumbar spine inspection:  Alignment  with normal lumbar lordosis, without scoliosis apparent.   Thoracic spine palpation:  without tenderness of spinal processes   Lumbar spine palpation:  without tenderness of lumbar area; without tightness of lumbar muscles    Range of Motion:   Lumbar flexion, forward flexion is normal without pain or tenderness    Lumbar extension is full without pain or tenderness   Left lateral bend is normal without pain or tenderness   Right lateral bend is normal without pain or tenderness   Straight leg raising is normal  Strength & tone: normal   Stability overall normal stability All other systems reviewed and are negative   The patient has been educated about the nature of the problem(s) and counseled on treatment options.  The patient appeared to understand what I have discussed and is in agreement with it.  Encounter Diagnosis  Name Primary?  . Chronic midline low back pain without sciatica Yes    PLAN Call if any problems.  Precautions discussed.  Continue current medications.   Return to clinic 1 month   Keep appointment with neurosurgeon next week.  Electronically Signed Sanjuana Kava, MD 11/13/20199:52 AM

## 2017-11-27 DIAGNOSIS — G8929 Other chronic pain: Secondary | ICD-10-CM | POA: Diagnosis not present

## 2017-11-27 DIAGNOSIS — M545 Low back pain: Secondary | ICD-10-CM | POA: Diagnosis not present

## 2017-11-27 DIAGNOSIS — R030 Elevated blood-pressure reading, without diagnosis of hypertension: Secondary | ICD-10-CM | POA: Diagnosis not present

## 2017-12-06 ENCOUNTER — Encounter: Payer: Self-pay | Admitting: Family Medicine

## 2017-12-06 ENCOUNTER — Other Ambulatory Visit: Payer: Self-pay

## 2017-12-06 ENCOUNTER — Ambulatory Visit (INDEPENDENT_AMBULATORY_CARE_PROVIDER_SITE_OTHER): Payer: Medicare Other | Admitting: Family Medicine

## 2017-12-06 VITALS — BP 130/82 | HR 62 | Temp 98.7°F | Resp 16 | Ht 74.0 in | Wt 184.0 lb

## 2017-12-06 DIAGNOSIS — Z113 Encounter for screening for infections with a predominantly sexual mode of transmission: Secondary | ICD-10-CM | POA: Diagnosis not present

## 2017-12-06 DIAGNOSIS — L84 Corns and callosities: Secondary | ICD-10-CM | POA: Diagnosis not present

## 2017-12-06 LAB — URINALYSIS, ROUTINE W REFLEX MICROSCOPIC
BILIRUBIN URINE: NEGATIVE
GLUCOSE, UA: NEGATIVE
Hgb urine dipstick: NEGATIVE
Ketones, ur: NEGATIVE
LEUKOCYTES UA: NEGATIVE
Nitrite: NEGATIVE
PH: 5.5 (ref 5.0–8.0)
Protein, ur: NEGATIVE
SPECIFIC GRAVITY, URINE: 1.028 (ref 1.001–1.03)

## 2017-12-06 NOTE — Progress Notes (Signed)
   Subjective:    Patient ID: Jacob Reed, male    DOB: May 17, 1965, 52 y.o.   MRN: [id number]  Patient presents for Follow-up (is not fasting)  Pt here to f/u   He has chronic back pain- seen by neurosurgery told nothing can be done due to arthritis , told to take NSAIDs, also on Tylenol #3  Has colonoscopy scheduled for January    His ex girlfriend was arrested, he has had unprotected sex, request STD screen  Right great toe has a spot on it, has been there for a month or so, no specific injury   Review Of Systems:  GEN- denies fatigue, fever, weight loss,weakness, recent illness HEENT- denies eye drainage, change in vision, nasal discharge, CVS- denies chest pain, palpitations RESP- denies SOB, cough, wheeze ABD- denies N/V, change in stools, abd pain GU- denies dysuria, hematuria, dribbling, incontinence MSK- +joint pain, muscle aches, injury Neuro- denies headache, dizziness, syncope, seizure activity       Objective:    BP 130/82   Pulse 62   Temp 98.7 F (37.1 C) (Oral)   Resp 16   Ht 6\' 2"  (1.88 m)   Wt 184 lb (83.5 kg)   SpO2 97%   BMI 23.62 kg/m  GEN- NAD, alert and oriented x3 HEENT- PERRL, EOMI, non injected sclera, pink conjunctiva, MMM, oropharynx clear Neck- Supple, no thyromegaly CVS- RRR, no murmur RESP-CTAB ABD-NABS,soft,NT,ND EXT- No edema Right foot preulceratie callus- on great toe with bruising/bleeding beneath skin  Pulses- Radial, DP- 2+        Assessment & Plan:      Problem List Items Addressed This Visit    None    Visit Diagnoses    Screen for STD (sexually transmitted disease)    -  Primary   Relevant Orders   C. trachomatis/N. gonorrhoeae RNA (Completed)   HIV Antibody (routine testing w rflx)   RPR   Urinalysis, Routine w reflex microscopic (Completed)   Trichomonas vaginalis RNA, Ql,Males   Pre-ulcerative calluses          Note: This dictation was prepared with Dragon dictation along with smaller phrase  technology. Any transcriptional errors that result from this process are unintentional.

## 2017-12-06 NOTE — Patient Instructions (Addendum)
Referral to foot doctor F/U 3  months for physical - come fasting

## 2017-12-07 ENCOUNTER — Encounter: Payer: Self-pay | Admitting: Family Medicine

## 2017-12-07 LAB — C. TRACHOMATIS/N. GONORRHOEAE RNA
C. trachomatis RNA, TMA: NOT DETECTED
N. gonorrhoeae RNA, TMA: NOT DETECTED

## 2017-12-08 LAB — HIV ANTIBODY (ROUTINE TESTING W REFLEX): HIV 1&2 Ab, 4th Generation: NONREACTIVE

## 2017-12-08 LAB — RPR: RPR Ser Ql: NONREACTIVE

## 2017-12-08 LAB — TRICHOMONAS VAGINALIS RNA, QL,MALES: Trichomonas vaginalis RNA: NOT DETECTED

## 2017-12-11 ENCOUNTER — Telehealth: Payer: Self-pay | Admitting: Family Medicine

## 2017-12-11 NOTE — Telephone Encounter (Signed)
Patient calling about lab results. 

## 2017-12-12 NOTE — Telephone Encounter (Signed)
Please refer to lab for further information.

## 2017-12-19 ENCOUNTER — Ambulatory Visit: Payer: Medicare Other | Admitting: Orthopaedic Surgery

## 2017-12-19 ENCOUNTER — Encounter: Payer: Self-pay | Admitting: Orthopaedic Surgery

## 2017-12-25 ENCOUNTER — Other Ambulatory Visit: Payer: Self-pay

## 2017-12-25 ENCOUNTER — Encounter (HOSPITAL_COMMUNITY): Payer: Self-pay | Admitting: Emergency Medicine

## 2017-12-25 ENCOUNTER — Emergency Department (HOSPITAL_COMMUNITY): Payer: Medicare Other

## 2017-12-25 ENCOUNTER — Emergency Department (HOSPITAL_COMMUNITY)
Admission: EM | Admit: 2017-12-25 | Discharge: 2017-12-25 | Disposition: A | Discharge status: Home / Self Care | Payer: Medicare Other | Attending: Emergency Medicine | Admitting: Emergency Medicine

## 2017-12-25 DIAGNOSIS — Y9389 Activity, other specified: Secondary | ICD-10-CM | POA: Insufficient documentation

## 2017-12-25 DIAGNOSIS — Y999 Unspecified external cause status: Secondary | ICD-10-CM | POA: Insufficient documentation

## 2017-12-25 DIAGNOSIS — S6992XA Unspecified injury of left wrist, hand and finger(s), initial encounter: Secondary | ICD-10-CM | POA: Diagnosis not present

## 2017-12-25 DIAGNOSIS — Z87891 Personal history of nicotine dependence: Secondary | ICD-10-CM | POA: Insufficient documentation

## 2017-12-25 DIAGNOSIS — X500XXA Overexertion from strenuous movement or load, initial encounter: Secondary | ICD-10-CM | POA: Diagnosis not present

## 2017-12-25 DIAGNOSIS — M79645 Pain in left finger(s): Secondary | ICD-10-CM | POA: Diagnosis not present

## 2017-12-25 DIAGNOSIS — Z23 Encounter for immunization: Secondary | ICD-10-CM | POA: Diagnosis not present

## 2017-12-25 DIAGNOSIS — Y929 Unspecified place or not applicable: Secondary | ICD-10-CM | POA: Insufficient documentation

## 2017-12-25 MED ORDER — TETANUS-DIPHTH-ACELL PERTUSSIS 5-2.5-18.5 LF-MCG/0.5 IM SUSP
0.5000 mL | Freq: Once | INTRAMUSCULAR | Status: AC
  Administered 2017-12-25: 0.5 mL via INTRAMUSCULAR
  Filled 2017-12-25: qty 0.5

## 2017-12-25 NOTE — ED Provider Notes (Signed)
[facility] EMERGENCY DEPARTMENT Provider Note   CSN: [id number] Arrival date & time: 12/25/17  1135     History   Chief Complaint Chief Complaint  Patient presents with  . Finger Injury    HPI Jacob Reed is a 52 y.o. male w/ a hx of anxiety/depression who presents to the ED with complaints of injury to L 2nd digit which occurred shortly PTA. Patient states that he was helping move furniture, he was trying to grab hold of a cloth recliner when he felt a sharp pain in the distal L 2nd digit. There was a small puncture wound w/ bleeding. He held pressure with resolution of bleeding. He is concerned something is in his finger. The area is mildly uncomfortable. No alleviating/aggravating factors. Unknown last tetanus. Denies numbness, tingling or weakness, no other areas of injury. He is unsure what cut the finger and did not see an obvious FB.   HPI  Past Medical History:  Diagnosis Date  . Anxiety   . Arthritis   . Chronic neck and back pain   . Dental caries   . Sinusitis     Patient Active Problem List   Diagnosis Date Noted  . 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 DDD (degenerative disc disease), lumbar 09/22/2015  . DDD (degenerative disc disease), cervical 09/22/2015  . Insomnia 06/29/2013  . Tinnitus 06/26/2013  . Dizziness and giddiness 04/24/2013  . External hemorrhoid 04/24/2013  . Routine general medical examination at a health care facility 04/24/2013  . Depression with anxiety 11/13/2011  . Arthritis 11/13/2011  . Seasonal allergies 11/13/2011    Past Surgical History:  Procedure Laterality Date  . INGUINAL HERNIA REPAIR Right 10/01/2012   Procedure: HERNIA REPAIR INGUINAL ADULT;  Surgeon: Scherry Ran, MD;  Location: [facility];  Service: General;  Laterality: Right;  . TOOTH EXTRACTION    . VASECTOMY          Home Medications    Prior to Admission medications   Medication Sig Start Date End Date Taking? Authorizing Provider    Acetaminophen (TYLENOL GO TABS EXTRA STRENGTH PO) Take by mouth as needed.    [provider]  acetaminophen-codeine (TYLENOL #3) 300-30 MG tablet One every six hours as needed for pain. 11/22/17   Sanjuana Kava, MD  Na Sulfate-K Sulfate-Mg Sulf (SUPREP BOWEL PREP KIT) 17.5-3.13-1.6 GM/177ML SOLN Take 1 kit by mouth as directed. Patient not taking: Reported on 12/06/2017 11/16/17   Mahala Menghini, PA-C    Family History Family History  Problem Relation Age of Onset  . Arthritis Mother   . Heart disease Father   . Arthritis Father     Social History Social History   Tobacco Use  . Smoking status: Former Research scientist (life sciences)  . Smokeless tobacco: Current User    Types: Snuff  Substance Use Topics  . Alcohol use: No  . Drug use: No     Allergies   Tramadol   Review of Systems Review of Systems  Constitutional: Negative for chills and fever.  Skin: Positive for wound.  Neurological: Negative for weakness and numbness.     Physical Exam Updated Vital Signs BP 125/74 (BP Location: Right Arm)   Pulse 89   Temp 98 F (36.7 C) (Oral)   Resp 16   Ht '6\' 2"'  (1.88 m)   Wt 81.6 kg   SpO2 98%   BMI 23.11 kg/m   Physical Exam Vitals signs and nursing note reviewed.  Constitutional:  General: He is not in acute distress.    Appearance: He is well-developed.  HENT:     Head: Normocephalic and atraumatic.  Eyes:     General:        Right eye: No discharge.        Left eye: No discharge.     Conjunctiva/sclera: Conjunctivae normal.  Cardiovascular:     Pulses:          Radial pulses are 2+ on the right side and 2+ on the left side.  Musculoskeletal:     Comments: L hand: there is a very superficial somewhat of a puncture type wound to the distal aspect of the palmar surface of the 2nd distal phalanx. No active bleeding. No appreciable visible/palpable FB. Full AROM to all IP/MCP joints. Minimal tenderness to palpation over the wound. Otherwise nontender.   Skin:     Capillary Refill: Capillary refill takes less than 2 seconds.  Neurological:     Mental Status: He is alert.     Comments: Clear speech. Sensation grossly intact to bilateral upper extremities. 5/5 symmetric grip strength.   Psychiatric:        Behavior: Behavior normal.        Thought Content: Thought content normal.        ED Treatments / Results  Labs (all labs ordered are listed, but only abnormal results are displayed) Labs Reviewed - No data to display  EKG None  Radiology Dg Finger Index Left  Result Date: 12/25/2017 CLINICAL DATA:  Patient reports foreign body. Sharp sensation in tip of finger. EXAM: LEFT INDEX FINGER 2+V COMPARISON:  None. FINDINGS: No fracture or bony abnormality identified. No radiopaque foreign body noted. No bony erosion. IMPRESSION: No radiopaque foreign body identified on this study. Electronically Signed   By: Dorise Bullion III M.D   On: 12/25/2017 12:04    Procedures Procedures (including critical care time) EMERGENCY DEPARTMENT US SOFT TISSUE INTERPRETATION "Study: Limited Soft Tissue Ultrasound"  INDICATIONS: Pain Multiple views of the body part were obtained in real-time with a multi-frequency linear probe  PERFORMED BY: Myself IMAGES ARCHIVED?: No SIDE:Left BODY PART:finger INTERPRETATION:  No appreciable FB   Medications Ordered in ED Medications  Tdap (BOOSTRIX) injection 0.5 mL (0.5 mLs Intramuscular Given 12/25/17 1401)     Initial Impression / Assessment and Plan / ED Course  I have reviewed the triage vital signs and the nursing notes.  Pertinent labs & imaging results that were available during my care of the patient were reviewed by me and considered in my medical decision making (see chart for details).   Patient presents to the ED with L 2nd finger injury and concern for FB. Exam w/ very superficial wound to palmar surface of the distal phalanx. No active bleeding. No appreciable visible/palpable FB on exam. Xray  negative for fracture/dislocation or radiopaque FB. Bedside US without evidence of FB. Tetanus updated. Appears safe for discharge with PCP follow up. I discussed results, treatment plan, need for follow-up, and return precautions with the patient. Provided opportunity for questions, patient confirmed understanding and is in agreement with plan.    Final Clinical Impressions(s) / ED Diagnoses   Final diagnoses:  Injury of finger of left hand, initial encounter    ED Discharge Orders    None       Amaryllis Dyke, PA-C 12/25/17 1408    Elnora Morrison, MD 12/25/17 [phone]

## 2017-12-25 NOTE — Discharge Instructions (Addendum)
You were seen in the ER today for a finger injury. Your xray and ultrasound did not show foreign body. We updated your tetanus. Please keep the area clean and dry. See your primary care in 3 days for a recheck of the wound. Return to the Er for new or worsening symptoms including but not limited to surrounding redness, pus draining, fever, or any other concerns.

## 2017-12-25 NOTE — ED Triage Notes (Signed)
Pt reports foreign body in LT index finger. Pt states he was moving furniture when he felt a sharp sensation.

## 2017-12-27 ENCOUNTER — Encounter: Payer: Self-pay | Admitting: Orthopaedic Surgery

## 2017-12-27 ENCOUNTER — Ambulatory Visit (INDEPENDENT_AMBULATORY_CARE_PROVIDER_SITE_OTHER): Payer: Medicare Other | Admitting: Orthopaedic Surgery

## 2017-12-27 VITALS — BP 121/82 | HR 76 | Ht 74.0 in | Wt 189.0 lb

## 2017-12-27 DIAGNOSIS — G8929 Other chronic pain: Secondary | ICD-10-CM | POA: Diagnosis not present

## 2017-12-27 DIAGNOSIS — M545 Low back pain: Secondary | ICD-10-CM

## 2017-12-27 MED ORDER — ACETAMINOPHEN-CODEINE #3 300-30 MG PO TABS: ORAL_TABLET | ORAL | 0 refills | Status: DC

## 2017-12-27 NOTE — Progress Notes (Signed)
Patient Jacob Reed, male DOB:11-28-1965, 52 y.o. [id number]  Chief Complaint  Patient presents with  . Back Pain    HPI  Jacob Reed is a 52 y.o. male who has chronic lower back pain with no paresthesias.  He is taking his medicine and doing his exercises.  He has no weakness.  He has no redness.  He has no new trauma.  The cold weather has made him worse.   Body mass index is 24.27 kg/m.  ROS  Review of Systems  All other systems reviewed and are negative.  The following is a summary of the past history medically, past history surgically, known current medicines, social history and family history.  This information is gathered electronically by the computer from prior information and documentation.  I review this each visit and have found including this information at this point in the chart is beneficial and informative.    Past Medical History:  Diagnosis Date  . Anxiety   . Arthritis   . Chronic neck and back pain   . Dental caries   . Sinusitis     Past Surgical History:  Procedure Laterality Date  . INGUINAL HERNIA REPAIR Right 10/01/2012   Procedure: HERNIA REPAIR INGUINAL ADULT;  Surgeon: Scherry Ran, MD;  Location: [facility];  Service: General;  Laterality: Right;  . TOOTH EXTRACTION    . VASECTOMY      Family History  Problem Relation Age of Onset  . Arthritis Mother   . Heart disease Father   . Arthritis Father     Social History Social History   Tobacco Use  . Smoking status: Former Research scientist (life sciences)  . Smokeless tobacco: Current User    Types: Snuff  Substance Use Topics  . Alcohol use: No  . Drug use: No    Allergies  Allergen Reactions  . Tramadol Hives    Current Outpatient Medications  Medication Sig Dispense Refill  . Acetaminophen (TYLENOL GO TABS EXTRA STRENGTH PO) Take by mouth as needed.    Marland Kitchen acetaminophen-codeine (TYLENOL #3) 300-30 MG tablet One every six hours as needed for pain. 28 tablet 0  . Na Sulfate-K Sulfate-Mg  Sulf (SUPREP BOWEL PREP KIT) 17.5-3.13-1.6 GM/177ML SOLN Take 1 kit by mouth as directed. (Patient not taking: Reported on 12/06/2017) 1 Bottle 0   No current facility-administered medications for this visit.      Physical Exam  Blood pressure 121/82, pulse 76, height _0  (1.88 m), weight 189 lb (85.7 kg).  Constitutional: overall normal hygiene, normal nutrition, well developed, normal grooming, normal body habitus. Assistive device:none  Musculoskeletal: gait and station Limp none, muscle tone and strength are normal, no tremors or atrophy is present.  .  Neurological: coordination overall normal.  Deep tendon reflex/nerve stretch intact.  Sensation normal.  Cranial nerves II-XII intact.   Skin:   Normal overall no scars, lesions, ulcers or rashes. No psoriasis.  Psychiatric: Alert and oriented x 3.  Recent memory intact, remote memory unclear.  Normal mood and affect. Well groomed.  Good eye contact.  Cardiovascular: overall no swelling, no varicosities, no edema bilaterally, normal temperatures of the legs and arms, no clubbing, cyanosis and good capillary refill.  Lymphatic: palpation is normal.  Spine/Pelvis examination:  Inspection:  Overall, sacoiliac joint benign and hips nontender; without crepitus or defects.   Thoracic spine inspection: Alignment normal without kyphosis present   Lumbar spine inspection:  Alignment  with normal lumbar lordosis, without scoliosis apparent.   Thoracic spine  palpation:  without tenderness of spinal processes   Lumbar spine palpation: without tenderness of lumbar area; without tightness of lumbar muscles    Range of Motion:   Lumbar flexion, forward flexion is normal without pain or tenderness    Lumbar extension is full without pain or tenderness   Left lateral bend is normal without pain or tenderness   Right lateral bend is normal without pain or tenderness   Straight leg raising is normal  Strength & tone: normal   Stability  overall normal stability  All other systems reviewed and are negative   The patient has been educated about the nature of the problem(s) and counseled on treatment options.  The patient appeared to understand what I have discussed and is in agreement with it.  Encounter Diagnosis  Name Primary?  . Chronic midline low back pain without sciatica Yes    PLAN Call if any problems.  Precautions discussed.  Continue current medications.   Return to clinic 6 weeks   I have reviewed the [place] web site prior to prescribing narcotic medicine for this patient.    Electronically Signed Sanjuana Kava, MD 12/18/201910:37 AM

## 2018-01-09 ENCOUNTER — Telehealth: Payer: Self-pay

## 2018-01-09 NOTE — Telephone Encounter (Signed)
VM left, pt wants to R/s his TCS scheduled for 01/12/17. Please call pt at [phone]

## 2018-01-09 NOTE — Telephone Encounter (Signed)
JL triage patient. Spoke with patient and he states he has a lot of things to do and needs to r/s. He is now on for 02/28/17 at 8:30am. Lynnae Sandhoff in endo and made aware. I have mailed new instructions to him. FYI to [employer]

## 2018-01-11 NOTE — Telephone Encounter (Signed)
Noted! Thank you

## 2018-01-22 ENCOUNTER — Emergency Department (HOSPITAL_COMMUNITY)
Admission: EM | Admit: 2018-01-22 | Discharge: 2018-01-22 | Discharge status: Against Medical Advice | Payer: Medicare Other | Attending: Emergency Medicine | Admitting: Emergency Medicine

## 2018-01-22 ENCOUNTER — Other Ambulatory Visit: Payer: Self-pay

## 2018-01-22 ENCOUNTER — Encounter (HOSPITAL_COMMUNITY): Payer: Self-pay | Admitting: Emergency Medicine

## 2018-01-22 DIAGNOSIS — Z5321 Procedure and treatment not carried out due to patient leaving prior to being seen by health care provider: Secondary | ICD-10-CM | POA: Insufficient documentation

## 2018-01-22 DIAGNOSIS — M25541 Pain in joints of right hand: Secondary | ICD-10-CM | POA: Diagnosis not present

## 2018-01-22 NOTE — ED Triage Notes (Signed)
Pt C/O palm of right hand itching and burning. Pt states this has been going since November.

## 2018-01-26 ENCOUNTER — Ambulatory Visit: Payer: Medicare Other | Admitting: Family Medicine

## 2018-01-30 ENCOUNTER — Other Ambulatory Visit: Payer: Self-pay

## 2018-01-30 ENCOUNTER — Encounter: Payer: Self-pay | Admitting: Family Medicine

## 2018-01-30 ENCOUNTER — Ambulatory Visit (INDEPENDENT_AMBULATORY_CARE_PROVIDER_SITE_OTHER): Payer: Medicare Other | Admitting: Family Medicine

## 2018-01-30 VITALS — BP 128/82 | HR 66 | Temp 97.9°F | Resp 14 | Ht 74.0 in | Wt 189.0 lb

## 2018-01-30 DIAGNOSIS — L309 Dermatitis, unspecified: Secondary | ICD-10-CM

## 2018-01-30 MED ORDER — CLOBETASOL PROPIONATE 0.05 % EX CREA: 1.0000 "application " | TOPICAL_CREAM | Freq: Two times a day (BID) | CUTANEOUS | 0 refills | Status: DC

## 2018-01-30 NOTE — Patient Instructions (Addendum)
Steroid cream twice a day  Use a lotion on hands ( [place], Eucerin) Use gloves  F/U 3 months for physical
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
## 2018-02-07 ENCOUNTER — Encounter: Payer: Self-pay | Admitting: Orthopaedic Surgery

## 2018-02-07 ENCOUNTER — Ambulatory Visit (INDEPENDENT_AMBULATORY_CARE_PROVIDER_SITE_OTHER): Payer: Medicare Other | Admitting: Orthopaedic Surgery

## 2018-02-07 VITALS — BP 127/78 | HR 68 | Ht 74.0 in | Wt 187.0 lb

## 2018-02-07 DIAGNOSIS — M545 Low back pain: Secondary | ICD-10-CM | POA: Diagnosis not present

## 2018-02-07 DIAGNOSIS — G8929 Other chronic pain: Secondary | ICD-10-CM | POA: Diagnosis not present

## 2018-02-07 MED ORDER — ACETAMINOPHEN-CODEINE #3 300-30 MG PO TABS: ORAL_TABLET | ORAL | 0 refills | Status: DC

## 2018-02-07 NOTE — Progress Notes (Signed)
Patient Jacob Reed, male DOB:1965-08-03, 53 y.o. [id number]  Chief Complaint  Patient presents with  . Back Pain    HPI  KIYOSHI SCHAAB is a 53 y.o. male who has chronic lower back pain.  He has had more pain with the cold weather.  He has no new trauma, no paresthesias or weakness.  He is taking his medicine and doing his exercises.   Body mass index is 24.01 kg/m.  ROS  Review of Systems  Respiratory: Negative for cough and shortness of breath.   Cardiovascular: Negative for chest pain and leg swelling.  Musculoskeletal: Positive for arthralgias, back pain and neck pain.  Psychiatric/Behavioral: The patient is nervous/anxious.   All other systems reviewed and are negative.   All other systems reviewed and are negative.  The following is a summary of the past history medically, past history surgically, known current medicines, social history and family history.  This information is gathered electronically by the computer from prior information and documentation.  I review this each visit and have found including this information at this point in the chart is beneficial and informative.    Past Medical History:  Diagnosis Date  . Anxiety   . Arthritis   . Chronic neck and back pain   . Dental caries   . Sinusitis     Past Surgical History:  Procedure Laterality Date  . INGUINAL HERNIA REPAIR Right 10/01/2012   Procedure: HERNIA REPAIR INGUINAL ADULT;  Surgeon: Scherry Ran, MD;  Location: [facility];  Service: General;  Laterality: Right;  . TOOTH EXTRACTION    . VASECTOMY      Family History  Problem Relation Age of Onset  . Arthritis Mother   . Heart disease Father   . Arthritis Father     Social History Social History   Tobacco Use  . Smoking status: Former Research scientist (life sciences)  . Smokeless tobacco: Current User    Types: Snuff  Substance Use Topics  . Alcohol use: No  . Drug use: No    Allergies  Allergen Reactions  . Tramadol Hives    Current  Outpatient Medications  Medication Sig Dispense Refill  . acetaminophen (TYLENOL) 500 MG tablet Take 500 mg by mouth daily as needed for moderate pain.    Marland Kitchen acetaminophen-codeine (TYLENOL #3) 300-30 MG tablet One every six hours as needed for pain. 28 tablet 0  . clobetasol cream (TEMOVATE) 7.01 % Apply 1 application topically 2 (two) times daily. 30 g 0  . loratadine (CLARITIN) 10 MG tablet Take 10 mg by mouth daily as needed for allergies.    . Na Sulfate-K Sulfate-Mg Sulf (SUPREP BOWEL PREP KIT) 17.5-3.13-1.6 GM/177ML SOLN Take 1 kit by mouth as directed. (Patient not taking: Reported on 01/30/2018) 1 Bottle 0   No current facility-administered medications for this visit.      Physical Exam  Blood pressure 127/78, pulse 68, height '6\' 2"'  (1.88 m), weight 187 lb (84.8 kg).  Constitutional: overall normal hygiene, normal nutrition, well developed, normal grooming, normal body habitus. Assistive device:none  Musculoskeletal: gait and station Limp none, muscle tone and strength are normal, no tremors or atrophy is present.  .  Neurological: coordination overall normal.  Deep tendon reflex/nerve stretch intact.  Sensation normal.  Cranial nerves II-XII intact.   Skin:   Normal overall no scars, lesions, ulcers or rashes. No psoriasis.  Psychiatric: Alert and oriented x 3.  Recent memory intact, remote memory unclear.  Normal mood and affect. Well groomed.  Good eye contact.  Cardiovascular: overall no swelling, no varicosities, no edema bilaterally, normal temperatures of the legs and arms, no clubbing, cyanosis and good capillary refill.  Lymphatic: palpation is normal.  Spine/Pelvis examination:  Inspection:  Overall, sacoiliac joint benign and hips nontender; without crepitus or defects.   Thoracic spine inspection: Alignment normal without kyphosis present   Lumbar spine inspection:  Alignment  with normal lumbar lordosis, without scoliosis apparent.   Thoracic spine palpation:   without tenderness of spinal processes   Lumbar spine palpation: without tenderness of lumbar area; without tightness of lumbar muscles    Range of Motion:   Lumbar flexion, forward flexion is normal without pain or tenderness    Lumbar extension is full without pain or tenderness   Left lateral bend is normal without pain or tenderness   Right lateral bend is normal without pain or tenderness   Straight leg raising is normal  Strength & tone: normal   Stability overall normal stability  All other systems reviewed and are negative   The patient has been educated about the nature of the problem(s) and counseled on treatment options.  The patient appeared to understand what I have discussed and is in agreement with it.  Encounter Diagnosis  Name Primary?  . Chronic midline low back pain without sciatica Yes    PLAN Call if any problems.  Precautions discussed.  Continue current medications.   Return to clinic 2 months   I have reviewed the [place] web site prior to prescribing narcotic medicine for this patient.   Electronically Signed Sanjuana Kava, MD 1/29/20203:44 PM

## 2018-02-28 ENCOUNTER — Encounter: Payer: Medicare Other | Admitting: Family Medicine

## 2018-02-28 ENCOUNTER — Encounter (HOSPITAL_COMMUNITY): Admission: RE | Payer: Self-pay | Source: Home / Self Care

## 2018-02-28 ENCOUNTER — Ambulatory Visit (HOSPITAL_COMMUNITY): Admission: RE | Admit: 2018-02-28 | Payer: Medicare Other | Source: Home / Self Care | Admitting: Internal Medicine

## 2018-02-28 SURGERY — COLONOSCOPY
Anesthesia: Moderate Sedation

## 2018-03-12 ENCOUNTER — Ambulatory Visit: Payer: Medicare Other | Admitting: Family Medicine

## 2018-03-14 ENCOUNTER — Encounter: Payer: Self-pay | Admitting: Family Medicine

## 2018-04-02 ENCOUNTER — Other Ambulatory Visit: Payer: Self-pay

## 2018-04-02 ENCOUNTER — Other Ambulatory Visit: Payer: Medicare Other

## 2018-04-02 ENCOUNTER — Ambulatory Visit (INDEPENDENT_AMBULATORY_CARE_PROVIDER_SITE_OTHER): Payer: Medicare Other | Admitting: Family Medicine

## 2018-04-02 ENCOUNTER — Encounter: Payer: Self-pay | Admitting: Family Medicine

## 2018-04-02 VITALS — BP 118/82 | HR 80 | Temp 98.9°F | Resp 16 | Ht 74.0 in | Wt 188.2 lb

## 2018-04-02 DIAGNOSIS — Z113 Encounter for screening for infections with a predominantly sexual mode of transmission: Secondary | ICD-10-CM

## 2018-04-02 NOTE — Patient Instructions (Addendum)
F/U 4 months for PHYSICAL

## 2018-04-02 NOTE — Progress Notes (Signed)
   Subjective:    Patient ID: Jacob Reed, male    DOB: 08/17/1965, 53 y.o.   MRN: [id number]  Patient presents for [facility] TRANSMITTED DISEASE  Patient here for STD screening.  He does not have any current symptoms.  States that he was last sexually active 1 month ago. His last testing was done in November 2019  Review Of Systems:  GEN- denies fatigue, fever, weight loss,weakness, recent illness HEENT- denies eye drainage, change in vision, nasal discharge, CVS- denies chest pain, palpitations RESP- denies SOB, cough, wheeze ABD- denies N/V, change in stools, abd pain GU- denies dysuria, hematuria, dribbling, incontinence MSK- denies joint pain, muscle aches, injury Neuro- denies headache, dizziness, syncope, seizure activity       Objective:    BP 118/82   Pulse 80   Temp 98.9 F (37.2 C)   Resp 16   Ht 6\' 2"  (1.88 m)   Wt 188 lb 3.2 oz (85.4 kg)   BMI 24.16 kg/m  GEN- NAD, alert and oriented x3 HEENT- PERRL, EOMI, non injected sclera, pink conjunctiva, MMM, oropharynx clear CVS- RRR, no murmur RESP-CTAB ABD-NABS,soft,NT,ND Pulses- Radial 2+        Assessment & Plan:      Problem List Items Addressed This Visit    None    Visit Diagnoses    Screen for STD (sexually transmitted disease)    -  Primary   High risk sexual activity, STD screenig done   Relevant Orders   Hepatitis C antibody   HIV Antibody (routine testing w rflx)   RPR   Trichomonas vaginalis RNA, Ql,Males   C. trachomatis/N. gonorrhoeae RNA      Note: This dictation was prepared with Dragon dictation along with smaller Company secretary. Any transcriptional errors that result from this process are unintentional.

## 2018-04-03 LAB — HIV ANTIBODY (ROUTINE TESTING W REFLEX): HIV 1&2 Ab, 4th Generation: NONREACTIVE

## 2018-04-03 LAB — HEPATITIS C ANTIBODY
Hepatitis C Ab: NONREACTIVE
SIGNAL TO CUT-OFF: 0.01 (ref <1.00)

## 2018-04-03 LAB — RPR: RPR Ser Ql: NONREACTIVE

## 2018-04-04 LAB — C. TRACHOMATIS/N. GONORRHOEAE RNA
C. trachomatis RNA, TMA: NOT DETECTED
N. gonorrhoeae RNA, TMA: NOT DETECTED

## 2018-04-04 LAB — TRICHOMONAS VAGINALIS RNA, QL,MALES: Trichomonas vaginalis RNA: NOT DETECTED

## 2018-04-04 NOTE — Congregational Nurse Program (Unsigned)
  Dept: [phone]   Congregational Nurse Program Note  Date of Encounter: 04/04/2018  Past Medical History: Past Medical History:  Diagnosis Date  . Anxiety   . Arthritis   . Chronic neck and back pain   . Dental caries   . Sinusitis     Encounter Details: CNP Questionnaire - 04/04/18 1010      Questionnaire   Patient Status  Not Applicable    Race  White or Caucasian    Location Patient Served At  [employer], [employer];Medicare    Uninsured  Not Applicable    Food  Yes, have food insecurities    Housing/Utilities  Yes, have permanent housing    Transportation  No transportation needs    Interpersonal Safety  Yes, feel physically and emotionally safe where you currently live    Medication  No medication insecurities    Medical Provider  --   Dr. Buelah Manis   Referrals  Not Applicable    ED Visit Averted  Not Applicable    Life-Saving Intervention Made  Not Applicable     05/08/7679 B/P 118/74 Pulse 96 .Has a PCP Dr. Buelah Manis. Takes X-Strength Tylenol for Arthritis. Marko Plume [phone]

## 2018-04-10 ENCOUNTER — Ambulatory Visit: Payer: Medicare Other | Admitting: Orthopaedic Surgery

## 2018-05-02 ENCOUNTER — Encounter: Payer: Medicare Other | Admitting: Family Medicine

## 2018-06-01 ENCOUNTER — Other Ambulatory Visit: Payer: Self-pay

## 2018-06-01 ENCOUNTER — Ambulatory Visit (INDEPENDENT_AMBULATORY_CARE_PROVIDER_SITE_OTHER): Payer: Medicare Other | Admitting: Family Medicine

## 2018-06-01 VITALS — BP 118/70 | HR 77 | Temp 98.7°F | Resp 16 | Ht 74.0 in | Wt 189.0 lb

## 2018-06-01 DIAGNOSIS — Z13220 Encounter for screening for lipoid disorders: Secondary | ICD-10-CM | POA: Diagnosis not present

## 2018-06-01 DIAGNOSIS — E162 Hypoglycemia, unspecified: Secondary | ICD-10-CM

## 2018-06-01 DIAGNOSIS — R7309 Other abnormal glucose: Secondary | ICD-10-CM | POA: Diagnosis not present

## 2018-06-01 NOTE — Patient Instructions (Addendum)
We will call with lab results  F/u 6 months for physical

## 2018-06-01 NOTE — Progress Notes (Signed)
   Subjective:    Patient ID: Jacob Reed, male    DOB: 1965/09/25, 53 y.o.   MRN: [id number]  Patient presents for Diabetes (pt thinks he is diabetic, weakness, blood sugar dropped on his job)    He is concerned about his blood sugar. He has been working recently and gets sweaty and feels weak. 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  He has not been eating much states roommate makes him pay extra even though they share cost for groceries? ALso his ex wife has been coming around, trying to get his stimulus money. Denies any LOC, no chest pain, no SOB, no fever, no URI symptoms  Has never checked his sugar during the episodes   Note weight the same since Jan 2020    Review Of Systems:  GEN- denies fatigue, fever, weight loss,+weakness, recent illness HEENT- denies eye drainage, change in vision, nasal discharge, CVS- denies chest pain, palpitations RESP- denies SOB, cough, wheeze ABD- denies N/V, change in stools, abd pain MSK- denies joint pain, muscle aches, injury Neuro- denies headache, dizziness, syncope, seizure activity       Objective:    BP 118/70   Pulse 77   Temp 98.7 F (37.1 C)   Resp 16   Ht 6\' 2"  (1.88 m)   Wt 189 lb (85.7 kg)   SpO2 96%   BMI 24.27 kg/m  GEN- NAD, alert and oriented x3 HEENT- PERRL, EOMI, non injected sclera, pink conjunctiva, MMM, oropharynx clear Neck- Supple, no thyromegaly CVS- RRR, no murmur RESP-CTAB Psych- normal affect and mood EXT- No edema Pulses- Radial 2+        Assessment & Plan:      Problem List Items Addressed This Visit    None    Visit Diagnoses    Hypoglycemia    -  Primary   Appears well, no weight change, difficult to tell in his home life what is really going on. Does not appear malnourished on exam. Has not had recent glucose or lipid  will check these. Discussed moving to a different location, he moves often unfortunately. He denies any physical abuse from the room-mate    Relevant Orders   CBC with Differential/Platelet (Completed)   Comprehensive metabolic panel (Completed)   Hemoglobin A1c (Completed)   Screening cholesterol level       Relevant Orders   Lipid panel (Completed)      Note: This dictation was prepared with Dragon dictation along with smaller phrase technology. Any transcriptional errors that result from this process are unintentional.

## 2018-06-02 LAB — COMPREHENSIVE METABOLIC PANEL
AG Ratio: 1.7 (calc) (ref 1.0–2.5)
ALT: 14 U/L (ref 9–46)
AST: 16 U/L (ref 10–35)
Albumin: 4.7 g/dL (ref 3.6–5.1)
Alkaline phosphatase (APISO): 99 U/L (ref 35–144)
BUN: 17 mg/dL (ref 7–25)
CO2: 24 mmol/L (ref 20–32)
Calcium: 9.3 mg/dL (ref 8.6–10.3)
Chloride: 104 mmol/L (ref 98–110)
Creat: 1.18 mg/dL (ref 0.70–1.33)
Globulin: 2.7 g/dL (calc) (ref 1.9–3.7)
Glucose, Bld: 89 mg/dL (ref 65–99)
Potassium: 4.1 mmol/L (ref 3.5–5.3)
Sodium: 139 mmol/L (ref 135–146)
Total Bilirubin: 0.5 mg/dL (ref 0.2–1.2)
Total Protein: 7.4 g/dL (ref 6.1–8.1)

## 2018-06-02 LAB — CBC WITH DIFFERENTIAL/PLATELET
Absolute Monocytes: 518 cells/uL (ref 200–950)
Basophils Absolute: 59 cells/uL (ref 0–200)
Basophils Relative: 0.8 %
Eosinophils Absolute: 89 cells/uL (ref 15–500)
Eosinophils Relative: 1.2 %
HCT: 46.1 % (ref 38.5–50.0)
Hemoglobin: 15.9 g/dL (ref 13.2–17.1)
Lymphs Abs: 1798 cells/uL (ref 850–3900)
MCH: 30.5 pg (ref 27.0–33.0)
MCHC: 34.5 g/dL (ref 32.0–36.0)
MCV: 88.5 fL (ref 80.0–100.0)
MPV: 11 fL (ref 7.5–12.5)
Monocytes Relative: 7 %
Neutro Abs: 4936 cells/uL (ref 1500–7800)
Neutrophils Relative %: 66.7 %
Platelets: 186 10*3/uL (ref 140–400)
RBC: 5.21 10*6/uL (ref 4.20–5.80)
RDW: 12.7 % (ref 11.0–15.0)
Total Lymphocyte: 24.3 %
WBC: 7.4 10*3/uL (ref 3.8–10.8)

## 2018-06-02 LAB — LIPID PANEL
Cholesterol: 153 mg/dL (ref <200)
HDL: 47 mg/dL (ref >=40)
LDL Cholesterol (Calc): 88 mg/dL (calc)
Non-HDL Cholesterol (Calc): 106 mg/dL (calc) (ref <130)
Total CHOL/HDL Ratio: 3.3 (calc) (ref <5.0)
Triglycerides: 89 mg/dL (ref <150)

## 2018-06-02 LAB — HEMOGLOBIN A1C
Hgb A1c MFr Bld: 5.8 % of total Hgb — ABNORMAL HIGH (ref <5.7)
Mean Plasma Glucose: 120 (calc)
eAG (mmol/L): 6.6 (calc)

## 2018-06-03 ENCOUNTER — Encounter: Payer: Self-pay | Admitting: Family Medicine

## 2018-06-07 ENCOUNTER — Telehealth: Payer: Self-pay | Admitting: Radiology

## 2018-06-07 ENCOUNTER — Telehealth: Payer: Self-pay | Admitting: Orthopaedic Surgery

## 2018-06-07 NOTE — Telephone Encounter (Signed)
Patient asks if you could call him.  He is actually here with his wife for an appt today and Jacob Reed speak to you then. This is about medication.

## 2018-06-07 NOTE — Telephone Encounter (Signed)
No narcotics.  I have not seen for some time, more than three months.

## 2018-06-07 NOTE — Telephone Encounter (Signed)
Patient requested pain medication but Dr. Luna Glasgow declined the request.  I called Jacob Reed to let me know this and see if he wanted to schedule an appointment to see Dr. Luna Glasgow.  He said he had an appointment before but it canceled.  He said we had wanted to do a visit over the phone.  I told him that Dr. Luna Glasgow could still do a phone appointment.  He became aggravated and hung the phone up on me.  I didn't try to call him back

## 2018-06-22 ENCOUNTER — Other Ambulatory Visit: Payer: Self-pay

## 2018-06-22 ENCOUNTER — Encounter (HOSPITAL_COMMUNITY): Payer: Self-pay | Admitting: Emergency Medicine

## 2018-06-22 ENCOUNTER — Emergency Department (HOSPITAL_COMMUNITY)
Admission: EM | Admit: 2018-06-22 | Discharge: 2018-06-22 | Disposition: A | Discharge status: Home / Self Care | Payer: Medicare Other | Attending: Emergency Medicine | Admitting: Emergency Medicine

## 2018-06-22 DIAGNOSIS — K0889 Other specified disorders of teeth and supporting structures: Secondary | ICD-10-CM | POA: Diagnosis not present

## 2018-06-22 DIAGNOSIS — K121 Other forms of stomatitis: Secondary | ICD-10-CM

## 2018-06-22 DIAGNOSIS — Z87891 Personal history of nicotine dependence: Secondary | ICD-10-CM | POA: Diagnosis not present

## 2018-06-22 MED ORDER — MAGIC MOUTHWASH W/LIDOCAINE: 5.0000 mL | Freq: Three times a day (TID) | ORAL | 0 refills | Status: DC | PRN

## 2018-06-22 MED ORDER — PENICILLIN V POTASSIUM 500 MG PO TABS: 500.0000 mg | ORAL_TABLET | Freq: Three times a day (TID) | ORAL | 0 refills | Status: DC

## 2018-06-22 MED ORDER — IBUPROFEN 600 MG PO TABS: 600.0000 mg | ORAL_TABLET | Freq: Four times a day (QID) | ORAL | 0 refills | Status: DC | PRN

## 2018-06-22 NOTE — Discharge Instructions (Addendum)
Take the medication as directed.  Call your dentist to arrange a follow-up appt soon.

## 2018-06-22 NOTE — ED Provider Notes (Signed)
[facility] EMERGENCY DEPARTMENT Provider Note   CSN: [id number] Arrival date & time: 06/22/18  1754     History   Chief Complaint Chief Complaint  Patient presents with   Dental Pain    HPI Jacob Reed is a 53 y.o. male.     HPI   Jacob Reed is a 53 y.o. male who presents to the Emergency Department complaining of upper and lower dental pain for several days.  He also complains of "bumps on my tongue."  He states the dental pain began first and he woke up noticing several painful areas along the tip of his tongue.  He states these are worse with drinking certain liquids and eating solid food.  He states he has been unable to see his dentist due to the COVID virus.  He denies facial swelling, fever, chills, neck pain, difficulty swallowing opening and closing his mouth.   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   DDD (degenerative disc disease), lumbar 09/22/2015   DDD (degenerative disc disease), cervical 09/22/2015   Insomnia 06/29/2013   Tinnitus 06/26/2013   Dizziness and giddiness 04/24/2013   External hemorrhoid 04/24/2013   Routine general medical examination at a health care facility 04/24/2013   Depression with anxiety 11/13/2011   Arthritis 11/13/2011   Seasonal allergies 11/13/2011    Past Surgical History:  Procedure Laterality Date   INGUINAL HERNIA REPAIR Right 10/01/2012   Procedure: HERNIA REPAIR INGUINAL ADULT;  Surgeon: Scherry Ran, MD;  Location: [facility];  Service: General;  Laterality: Right;   TOOTH EXTRACTION     VASECTOMY          Home Medications    Prior to Admission medications   Medication Sig Start Date End Date Taking? Authorizing Provider  acetaminophen (TYLENOL) 500 MG tablet Take 500 mg by mouth daily as  needed for moderate pain.    [provider]  acetaminophen-codeine (TYLENOL #3) 300-30 MG tablet One every six hours as needed for pain. 02/07/18   Sanjuana Kava, MD  clobetasol cream (TEMOVATE) 3.76 % Apply 1 application topically 2 (two) times daily. 01/30/18   Alycia Rossetti, MD    Family History Family History  Problem Relation Age of Onset   Arthritis Mother    Heart disease Father    Arthritis Father     Social History Social History   Tobacco Use   Smoking status: Former Smoker   Smokeless tobacco: Current User    Types: Snuff  Substance Use Topics   Alcohol use: No   Drug use: No     Allergies   Tramadol   Review of Systems Review of Systems  Constitutional: Negative for appetite change and fever.  HENT: Positive for dental problem and mouth sores. Negative for congestion, facial swelling, sore throat and trouble swallowing.   Eyes: Negative for pain and visual disturbance.  Respiratory: Negative for cough and shortness of breath.   Cardiovascular: Negative for chest pain.  Gastrointestinal: Negative for abdominal pain, nausea and vomiting.  Musculoskeletal: Negative for neck pain and neck stiffness.  Skin: Negative for rash.  Neurological: Negative for dizziness and headaches.  Hematological: Negative for adenopathy.     Physical Exam Updated Vital Signs BP  121/85 (BP Location: Right Arm)    Pulse (!) 108    Temp 98.5 F (36.9 C) (Oral)    Resp 20    SpO2 97%   Physical Exam Vitals signs and nursing note reviewed.  Constitutional:      Appearance: Normal appearance.  HENT:     Head:     Jaw: There is normal jaw occlusion.     Right Ear: Tympanic membrane and ear canal normal.     Left Ear: Tympanic membrane and ear canal normal.     Mouth/Throat:     Mouth: Mucous membranes are moist. Oral lesions present.     Dentition: Dental tenderness and dental caries present.     Pharynx: Oropharynx is clear. Uvula midline.     Comments:  Pt has multiple dental caries with ttp of the upper bilateral lateral incisors and right lower bicuspid.  Poor dental hygiene.  Several small ulcerative appearing lesions to the distal tongue.  No edema, no trismus.  No lesions of the buccal mucosa, lips or palate.  No leukoplakia.   Neck:     Musculoskeletal: Normal range of motion. No neck rigidity.  Cardiovascular:     Rate and Rhythm: Normal rate and regular rhythm.     Pulses: Normal pulses.  Pulmonary:     Effort: Pulmonary effort is normal.     Breath sounds: Normal breath sounds.  Lymphadenopathy:     Cervical: No cervical adenopathy.  Skin:    General: Skin is warm.     Findings: No rash.  Neurological:     Mental Status: Mental status is at baseline.     Motor: No weakness.      ED Treatments / Results  Labs (all labs ordered are listed, but only abnormal results are displayed) Labs Reviewed - No data to display  EKG    Radiology No results found.  Procedures Procedures (including critical care time)  Medications Ordered in ED Medications - No data to display   Initial Impression / Assessment and Plan / ED Course  I have reviewed the triage vital signs and the nursing notes.  Pertinent labs & imaging results that were available during my care of the patient were reviewed by me and considered in my medical decision making (see chart for details).        Pt is well appearing.  Airway patent without edema.  No facial symptoms or trismus.  No concerning sx's for Ludwig's angina.  Oral lesions likely viral stomatitis.  Will treat with magic mouthwash, ibuprofen and PEN VK.  Blood work from 06/01/18 reviewed and kidney functions were wnml.  Final Clinical Impressions(s) / ED Diagnoses   Final diagnoses:  Pain, dental  Stomatitis    ED Discharge Orders    None       Kem Parkinson, PA-C 06/22/18 2015    Francine Graven, DO 06/27/18 1454

## 2018-06-22 NOTE — ED Triage Notes (Signed)
Patient reports dental pain "for the past few days." C/o "bumps on his tongue." C/o soreness around his bottom lip.

## 2018-06-27 ENCOUNTER — Other Ambulatory Visit: Payer: Self-pay

## 2018-06-27 NOTE — Patient Outreach (Signed)
[facility]) Care Management  06/27/2018  Jacob Reed [birth year] [id number]   Telephone Screen  Referral Date: 06/27/2018 Referral Source: [facility]. Referral Reason: " has to be out of his apartment by the 3rd/4th of the month, has not found another place,needs assistance" Insurance: [facility] Medicare   Outreach attempt #1 to patient. Spoke with patient and dicussed referral source and reason. Patient reports that his current landlord is "throwing him out." He voices that he has to be out by the 3rd/4th of next month. He states landlord is kicking him out because he won't drink and do drugs anymore like they do. Patient states that he has been looking into several possible alternative locations but has not been able to find housing. He voices that he does not have anyone he can stay with at present. He reports that he is unsure if he qualifies for section 8 housing. He states that he can only afford to pay about $400/month in rent. He is wanting to stay in [place] area but reports that he will also consider the [place] area. He reports that he is independent with ADLs/IADLs except he is unable to drive. He states that his license expired and he has not renewed them. He is using [facility] to get back and forth to MD appts. Patient reports that he does not have any chronic conditions that he is dealing with and voices he is "very healthy." he reports that he takes no prescription meds and only takes Tylenol occasionally. He denies any further [facility] needs or concerns at this time. Patient gave verbal consent for [facility] SW referral for possible assistance with housing.      Plan: RN CM will send [facility] SW referral for possible housing assistance/resources.    Enzo Montgomery, RN,BSN,CCM [place] [facility] Telephonic Care Management Coordinator Direct Phone: [phone] Toll Free: [phone] Fax: [phone]

## 2018-06-28 ENCOUNTER — Other Ambulatory Visit: Payer: Self-pay

## 2018-06-28 NOTE — Patient Outreach (Addendum)
[facility]) Care Management  06/28/2018  Jacob Reed [birth year] [id number]   Social work referral received from [employer], [employer].   "Referral from: [facility].  Patient needs assistance with finding housing-states he has to be out of current place by the 3rd/4th of month" BSW attempted to contact patient several times today but got disconnected each time.   Will attempt to reach him again within four business days.     Addendum:  Received return call from patient.  Patient reports that he currently rents a room and needs to move out due to conflict between him and the landlord.  He stated that a friend is helping him with locating other housing; they are going to an apartment complex today(he couldn't recall the name) to inquire about availability.  BSW agreed to send additional housing resources.  Patient prefers to remain in [place].  BSW explained that lower income and/or senior housing facilities typically have a wait list.  Patient voiced understanding.  Information/applications for the following were mailed to patient:  [facility], [place], and [facility] Will follow up within the next two weeks to ensure receipt of resources.   Ronn Melena, [place] Social Worker [phone]

## 2018-07-02 ENCOUNTER — Ambulatory Visit: Payer: Medicare Other

## 2018-07-10 ENCOUNTER — Other Ambulatory Visit: Payer: Self-pay

## 2018-07-10 NOTE — Patient Outreach (Signed)
[facility]) Care Management  07/10/2018  Jacob Reed [birth year] [id number]   Successful outreach to patient today to ensure receipt of housing resources mailed on 06/28/18.  Patient confirmed receipt but stated that he intends to relocate to [place].  Stated that he will keep resources in case he decides to come back.  BSW closing case at this time.  Ronn Melena, BSW Social Worker [phone]

## 2018-08-07 ENCOUNTER — Other Ambulatory Visit: Payer: Self-pay

## 2018-08-08 ENCOUNTER — Encounter: Payer: Medicare Other | Admitting: Family Medicine

## 2018-08-29 ENCOUNTER — Encounter: Payer: Medicare Other | Admitting: Family Medicine

## 2018-09-03 ENCOUNTER — Ambulatory Visit: Payer: Medicare Other | Admitting: Family Medicine

## 2018-09-10 ENCOUNTER — Ambulatory Visit: Payer: Medicare Other | Admitting: Family Medicine

## 2018-09-26 ENCOUNTER — Other Ambulatory Visit: Payer: Self-pay

## 2018-09-26 ENCOUNTER — Emergency Department (HOSPITAL_COMMUNITY)
Admission: EM | Admit: 2018-09-26 | Discharge: 2018-09-26 | Disposition: A | Discharge status: Home / Self Care | Payer: Medicare Other | Attending: Emergency Medicine | Admitting: Emergency Medicine

## 2018-09-26 ENCOUNTER — Encounter (HOSPITAL_COMMUNITY): Payer: Self-pay | Admitting: Emergency Medicine

## 2018-09-26 ENCOUNTER — Emergency Department (HOSPITAL_COMMUNITY): Payer: Medicare Other

## 2018-09-26 DIAGNOSIS — F17290 Nicotine dependence, other tobacco product, uncomplicated: Secondary | ICD-10-CM | POA: Insufficient documentation

## 2018-09-26 DIAGNOSIS — M7989 Other specified soft tissue disorders: Secondary | ICD-10-CM | POA: Diagnosis not present

## 2018-09-26 DIAGNOSIS — M25561 Pain in right knee: Secondary | ICD-10-CM | POA: Insufficient documentation

## 2018-09-26 MED ORDER — NAPROXEN 500 MG PO TABS: 500.0000 mg | ORAL_TABLET | Freq: Two times a day (BID) | ORAL | 0 refills | Status: DC

## 2018-09-26 MED ORDER — NAPROXEN 250 MG PO TABS
500.0000 mg | ORAL_TABLET | Freq: Once | ORAL | Status: AC
  Administered 2018-09-26: 500 mg via ORAL
  Filled 2018-09-26: qty 2

## 2018-09-26 NOTE — ED Notes (Signed)
To Rad 

## 2018-09-26 NOTE — ED Notes (Signed)
Out of room to BR  Pt ambulates heel to toe without any interruption of ease of gait

## 2018-09-26 NOTE — Discharge Instructions (Signed)
Please read and follow all provided instructions.  You have been seen today for knee pain.   Tests performed today include: An x-ray of the affected area - does NOT show any broken bones or dislocations.  Vital signs. See below for your results today.   Home care instructions: -- *PRICE in the first 24-48 hours after injury: Protect (with brace, splint, sling), if given by your provider Rest Ice- Do not apply ice pack directly to your skin, place towel or similar between your skin and ice/ice pack. Apply ice for 20 min, then remove for 40 min while awake Compression- Wear brace, elastic bandage, splint as directed by your provider Elevate affected extremity above the level of your heart when not walking around for the first 24-48 hours   Medications:  - Naproxen is a nonsteroidal anti-inflammatory medication that will help with pain and swelling. Be sure to take this medication as prescribed with food, 1 pill every 12 hours,  It should be taken with food, as it can cause stomach upset, and more seriously, stomach bleeding. Do not take other nonsteroidal anti-inflammatory medications with this such as Advil, Motrin, Aleve, Mobic, Goodie Powder, or Motrin.    You make take Tylenol per over the counter dosing with these medications.   We have prescribed you new medication(s) today. Discuss the medications prescribed today with your pharmacist as they can have adverse effects and interactions with your other medicines including over the counter and prescribed medications. Seek medical evaluation if you start to experience new or abnormal symptoms after taking one of these medicines, seek care immediately if you start to experience difficulty breathing, feeling of your throat closing, facial swelling, or rash as these could be indications of a more serious allergic reaction   Follow-up instructions: Please follow-up with your primary care provider or the provided orthopedic physician (bone  specialist) if you continue to have significant pain in 1 week. In this case you may have a more severe injury that requires further care.   Return instructions:  Please return if your digits or extremity are numb or tingling, appear gray or blue, or you have severe pain (also elevate the extremity and loosen splint or wrap if you were given one) Please return if you have redness or fevers.  Please return to the Emergency Department if you experience worsening symptoms.  Please return if you have any other emergent concerns. Additional Information:  Your vital signs today were: BP 123/73 (BP Location: Right Arm)    Pulse 98    Temp 98.4 F (36.9 C) (Oral)    Resp 18    Ht 6\' 2"  (1.88 m)    Wt 81.6 kg    SpO2 99%    BMI 23.11 kg/m  If your blood pressure (BP) was elevated above 135/85 this visit, please have this repeated by your doctor within one month. ---------------

## 2018-09-26 NOTE — ED Provider Notes (Signed)
[facility] EMERGENCY DEPARTMENT Provider Note   CSN: [id number] Arrival date & time: 09/26/18  1801     History   Chief Complaint Chief Complaint  Patient presents with   Knee Pain    HPI Jacob Reed is a 53 y.o. male with a hx of anxiety, depression, & degenerative disc disease who presents to the ED with complaints of right knee pain that began yesterday.  Patient states that the pain is to the outside of his right knee, constant, worse with movement, no alleviating factors.  Tried Tylenol without relief.  Has had some associated swelling as well.  He states he does a lot of heavy lifting at work but does not recall a specific injury or trauma to the knee.  He states he had issues with a clicking for several years.  He has not seen an orthopedist in the past.  Denies fever, chills, redness, numbness, tingling, or weakness.    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   DDD (degenerative disc disease), lumbar 09/22/2015   DDD (degenerative disc disease), cervical 09/22/2015   Insomnia 06/29/2013   Tinnitus 06/26/2013   Dizziness and giddiness 04/24/2013   External hemorrhoid 04/24/2013   Routine general medical examination at a health care facility 04/24/2013   Depression with anxiety 11/13/2011   Arthritis 11/13/2011   Seasonal allergies 11/13/2011    Past Surgical History:  Procedure Laterality Date   INGUINAL HERNIA REPAIR Right 10/01/2012   Procedure: HERNIA REPAIR INGUINAL ADULT;  Surgeon: Scherry Ran, MD;  Location: [facility];  Service: General;  Laterality: Right;   TOOTH EXTRACTION     VASECTOMY          Home Medications    Prior to Admission medications   Medication Sig Start Date End Date Taking? Authorizing Provider  acetaminophen  (TYLENOL) 500 MG tablet Take 500 mg by mouth daily as needed for moderate pain.    [provider]  acetaminophen-codeine (TYLENOL #3) 300-30 MG tablet One every six hours as needed for pain. 02/07/18   Sanjuana Kava, MD  clobetasol cream (TEMOVATE) [id number] % Apply 1 application topically 2 (two) times daily. 01/30/18   Alycia Rossetti, MD  ibuprofen (ADVIL) 600 MG tablet Take 1 tablet (600 mg total) by mouth every 6 (six) hours as needed for moderate pain. Take with food 06/22/18   Triplett, Tammy, PA-C  magic mouthwash w/lidocaine SOLN Take 5 mLs by mouth 3 (three) times daily as needed for mouth pain. Swish and spit, do not swallow 06/22/18   Triplett, Tammy, PA-C  penicillin v potassium (VEETID) 500 MG tablet Take 1 tablet (500 mg total) by mouth 3 (three) times daily. 06/22/18   Kem Parkinson, PA-C    Family History Family History  Problem Relation Age of Onset   Arthritis Mother    Heart disease Father    Arthritis Father     Social History Social History   Tobacco Use   Smoking status: Former Smoker   Smokeless tobacco: Current User    Types: Snuff  Substance Use Topics   Alcohol use: No   Drug use: No     Allergies   Tramadol   Review of Systems Review of Systems  Constitutional: Negative for chills and fever.  Respiratory: Negative for shortness of breath.   Cardiovascular: Negative for chest pain.  Musculoskeletal: Positive for arthralgias and joint swelling.  Skin: Negative for color change and wound.  Neurological: Negative for weakness and numbness.     Physical Exam Updated Vital Signs BP 123/73 (BP Location: Right Arm)    Pulse 98    Temp 98.4 F (36.9 C) (Oral)    Resp 18    Ht 6\' 2"  (1.88 m)    Wt 81.6 kg    SpO2 99%    BMI 23.11 kg/m   Physical Exam Vitals signs and nursing note reviewed.  Constitutional:      General: He is not in acute distress.    Appearance: He is not ill-appearing or toxic-appearing.  HENT:     Head:  Normocephalic and atraumatic.  Cardiovascular:     Pulses:          Dorsalis pedis pulses are 2+ on the right side and 2+ on the left side.       Posterior tibial pulses are 2+ on the right side and 2+ on the left side.  Pulmonary:     Effort: Pulmonary effort is normal.  Musculoskeletal:     Comments: Lower extremities: No obvious deformity, appreciable swelling, edema, erythema, ecchymosis, warmth, or open wounds. Patient has intact AROM to bilateral hips, knees, ankles, and all digits. RLE. Tender to palpation to the lateral joint line & lateral femoral condyle. Some tenderness to the patella as well. Otherwise nontender. NVI distally.   Skin:    General: Skin is warm and dry.     Capillary Refill: Capillary refill takes less than 2 seconds.  Neurological:     Mental Status: He is alert.     Comments: Alert. Clear speech. Sensation grossly intact to bilateral lower extremities. 5/5 strength with knee flexion/extension & ankle plantar/dorsiflexion bilaterally. Patient ambulatory, no foot drop noted.   Psychiatric:        Mood and Affect: Mood normal.        Behavior: Behavior normal.    ED Treatments / Results  Labs (all labs ordered are listed, but only abnormal results are displayed) Labs Reviewed - No data to display  EKG None  Radiology Dg Knee Complete 4 Views Right  Result Date: 09/26/2018 CLINICAL DATA:  Pain and swelling EXAM: RIGHT KNEE - COMPLETE 4+ VIEW COMPARISON:  September 15, 2007 FINDINGS: Frontal, lateral, and bilateral oblique views were obtained. There is no evident fracture or dislocation. There is no joint effusion. The joint spaces appear unremarkable. No erosive changes. There is a small spur along the anterior superior patella. IMPRESSION: Small anterior superior patellar spur, likely representing mild distal quadriceps tendinosis. No appreciable joint space narrowing or erosion. No fracture or dislocation. No appreciable joint effusion. Electronically Signed    By: Lowella Grip III M.D.   On: 09/26/2018 20:04    Procedures Procedures (including critical care time)  Medications Ordered in ED Medications  naproxen (NAPROSYN) tablet 500 mg (500 mg Oral Given 09/26/18 2041)     Initial Impression / Assessment and Plan / ED Course  I have reviewed the triage vital signs and the nursing notes.  Pertinent labs & imaging results that were available during my care of the patient were reviewed by me and considered in my medical decision making (see chart for details).   Patient presents to the emergency department with complaints of right knee pain.  Patient is nontoxic-appearing,  no apparent distress, vitals WNL.  Patient is afebrile, no erythema, no warmth -not consistent with septic joint.  Exam also does not seem typical of gout.  X-ray without fracture or dislocation, there is some mild distal quadriceps tendinosis noted, no effusion.  Neurovascular intact distally.  Offered knee immobilizer, patient would prefer knee sleeve, this was applied in the emergency department.  Will discharge with naproxen, last creatinine WNL, with orthopedic follow-up. I discussed results, treatment plan, need for follow-up, and return precautions with the patient. Provided opportunity for questions, patient confirmed understanding and is in agreement with plan.    Final Clinical Impressions(s) / ED Diagnoses   Final diagnoses:  Acute pain of right knee    ED Discharge Orders         Ordered    naproxen (NAPROSYN) 500 MG tablet  2 times daily     09/26/18 2034           Amaryllis Dyke, PA-C 09/26/18 2045    Milton Ferguson, MD 09/27/18 1230

## 2018-09-26 NOTE — ED Triage Notes (Signed)
Pt states that his right knee is swollen up he states that he hurt it at work yesterday.

## 2018-10-23 ENCOUNTER — Ambulatory Visit (INDEPENDENT_AMBULATORY_CARE_PROVIDER_SITE_OTHER): Payer: Medicare Other | Admitting: Orthopaedic Surgery

## 2018-10-23 ENCOUNTER — Other Ambulatory Visit: Payer: Self-pay

## 2018-10-23 ENCOUNTER — Encounter: Payer: Self-pay | Admitting: Orthopaedic Surgery

## 2018-10-23 VITALS — BP 116/81 | HR 72 | Ht 74.0 in | Wt 193.0 lb

## 2018-10-23 DIAGNOSIS — G8929 Other chronic pain: Secondary | ICD-10-CM | POA: Diagnosis not present

## 2018-10-23 DIAGNOSIS — M25561 Pain in right knee: Secondary | ICD-10-CM | POA: Diagnosis not present

## 2018-10-23 NOTE — Progress Notes (Signed)
Patient Jacob Reed, male DOB:[birth year], 53 y.o. [id number]  Chief Complaint  Patient presents with  . Knee Pain    right     HPI  Jacob Reed is a 53 y.o. male who has developed right knee pain. He went to the ER on 09-26-2018 for this.  He had X-rays.  I have reviewed the notes and the x-rays and report.  He continues to have right knee pain, swelling, popping and giving way.  He says nothing helps.  He has no other injury or problem.     Body mass index is 24.78 kg/m.  ROS  Review of Systems  Respiratory: Negative for cough and shortness of breath.   Cardiovascular: Negative for chest pain and leg swelling.  Musculoskeletal: Positive for arthralgias, back pain and neck pain.  Psychiatric/Behavioral: The patient is nervous/anxious.   All other systems reviewed and are negative.   All other systems reviewed and are negative.  The following is a summary of the past history medically, past history surgically, known current medicines, social history and family history.  This information is gathered electronically by the computer from prior information and documentation.  I review this each visit and have found including this information at this point in the chart is beneficial and informative.    Past Medical History:  Diagnosis Date  . Anxiety   . Arthritis   . Chronic neck and back pain   . Dental caries   . Sinusitis     Past Surgical History:  Procedure Laterality Date  . INGUINAL HERNIA REPAIR Right 10/01/2012   Procedure: HERNIA REPAIR INGUINAL ADULT;  Surgeon: Scherry Ran, MD;  Location: [facility];  Service: General;  Laterality: Right;  . TOOTH EXTRACTION    . VASECTOMY      Family History  Problem Relation Age of Onset  . Arthritis Mother   . Heart disease Father   . Arthritis Father     Social History Social History   Tobacco Use  . Smoking status: Former Research scientist (life sciences)  . Smokeless tobacco: Current User    Types: Snuff  Substance Use Topics   . Alcohol use: No  . Drug use: No    Allergies  Allergen Reactions  . Tramadol Hives    Current Outpatient Medications  Medication Sig Dispense Refill  . acetaminophen (TYLENOL) 500 MG tablet Take 500 mg by mouth daily as needed for moderate pain.    . naproxen (NAPROSYN) 500 MG tablet Take 1 tablet (500 mg total) by mouth 2 (two) times daily. 10 tablet 0   No current facility-administered medications for this visit.      Physical Exam  Blood pressure 116/81, pulse 72, height 6\' 2"  (1.88 m), weight 193 lb (87.5 kg).  Constitutional: overall normal hygiene, normal nutrition, well developed, normal grooming, normal body habitus. Assistive device:none  Musculoskeletal: gait and station Limp right, muscle tone and strength are normal, no tremors or atrophy is present.  .  Neurological: coordination overall normal.  Deep tendon reflex/nerve stretch intact.  Sensation normal.  Cranial nerves II-XII intact.   Skin:   Normal overall no scars, lesions, ulcers or rashes. No psoriasis.  Psychiatric: Alert and oriented x 3.  Recent memory intact, remote memory unclear.  Normal mood and affect. Well groomed.  Good eye contact.  Cardiovascular: overall no swelling, no varicosities, no edema bilaterally, normal temperatures of the legs and arms, no clubbing, cyanosis and good capillary refill.  Lymphatic: palpation is normal.  Right knee  has no effusion today, has some crepitus, ROM 0 to 110, medial joint line pain and medial positive [place].  NV intact.  Limp right.  All other systems reviewed and are negative   The patient has been educated about the nature of the problem(s) and counseled on treatment options.  The patient appeared to understand what I have discussed and is in agreement with it.  Encounter Diagnosis  Name Primary?  . Chronic pain of right knee Yes   PROCEDURE NOTE:  The patient requests injections of the right knee , verbal consent was obtained.  The right  knee was prepped appropriately after time out was performed.   Sterile technique was observed and injection of 1 cc of Depo-Medrol 40 mg with several cc's of plain xylocaine. Anesthesia was provided by ethyl chloride and a 20-gauge needle was used to inject the knee area. The injection was tolerated well.  A band aid dressing was applied.  The patient was advised to apply ice later today and tomorrow to the injection sight as needed.   PLAN Call if any problems.  Precautions discussed.  Continue current medications.   Return to clinic 2 weeks   Get MRI of the right knee.  Electronically Signed Sanjuana Kava, MD 10/13/202011:00 AM

## 2018-10-23 NOTE — Addendum Note (Signed)
Addended by: Derek Mound A on: 10/23/2018 11:07 AM   Modules accepted: Orders

## 2018-10-29 ENCOUNTER — Other Ambulatory Visit: Payer: Self-pay

## 2018-10-29 ENCOUNTER — Encounter: Payer: Self-pay | Admitting: Family Medicine

## 2018-10-29 ENCOUNTER — Ambulatory Visit (INDEPENDENT_AMBULATORY_CARE_PROVIDER_SITE_OTHER): Payer: Medicare Other | Admitting: Family Medicine

## 2018-10-29 DIAGNOSIS — M1711 Unilateral primary osteoarthritis, right knee: Secondary | ICD-10-CM

## 2018-10-29 DIAGNOSIS — R7309 Other abnormal glucose: Secondary | ICD-10-CM | POA: Diagnosis not present

## 2018-10-29 DIAGNOSIS — M1710 Unilateral primary osteoarthritis, unspecified knee: Secondary | ICD-10-CM | POA: Insufficient documentation

## 2018-10-29 DIAGNOSIS — E7439 Other disorders of intestinal carbohydrate absorption: Secondary | ICD-10-CM

## 2018-10-29 NOTE — Assessment & Plan Note (Signed)
Recheck A1c.  Also discussed dietary changes he has gained some weight advised to cut out sugar processed foods as much as possible

## 2018-10-29 NOTE — Assessment & Plan Note (Addendum)
I am not going to ntervene regarding his knee advised him to follow-up with his orthopedist He should get MRI so they can figure out the pathology or injury to the joint

## 2018-10-29 NOTE — Patient Instructions (Addendum)
We will call with lab results  F/U 6 months  

## 2018-10-29 NOTE — Progress Notes (Signed)
   Subjective:    Patient ID: Jacob Reed, male    DOB: 23-Apr-1965, 53 y.o.   MRN: [id number]  Patient presents for Follow-up   Pt here to f/u. He has MRI scheduled for tomorrow for knee pain. He has swelling on the knee and chronic pain. Given steroid shot on 10/13, he wanted my opinion about his knee and going for MRI   He has official divorce now which is relieved    He was at the [employer] told his sugar was high , he had A1C 5.8% back in May   Lipid was normal in May               Review Of Systems:  GEN- denies fatigue, fever, weight loss,weakness, recent illness HEENT- denies eye drainage, change in vision, nasal discharge, CVS- denies chest pain, palpitations RESP- denies SOB, cough, wheeze ABD- denies N/V, change in stools, abd pain GU- denies dysuria, hematuria, dribbling, incontinence MSK- +joint pain, muscle aches, injury Neuro- denies headache, dizziness, syncope, seizure activity       Objective:    BP 120/62   Pulse 90   Temp 98.7 F (37.1 C) (Oral)   Resp 14   Ht 6\' 2"  (1.88 m)   Wt 190 lb (86.2 kg)   SpO2 97%   BMI 24.39 kg/m  GEN- NAD, alert and oriented x3 HEENT- PERRL, EOMI, non injected sclera, pink conjunctiva, MMM, oropharynx clear CVS- RRR, no murmur RESP-CTAB EXT- No edema Pulses- Radial 2+        Assessment & Plan:      Problem List Items Addressed This Visit      Unprioritized   Glucose intolerance    Recheck A1c.  Also discussed dietary changes he has gained some weight advised to cut out sugar processed foods as much as possible      Relevant Orders   Basic metabolic panel   Hemoglobin A1c   OA (osteoarthritis) of knee    I am not going to ntervene regarding his knee advised him to follow-up with his orthopedist He should get MRI so they can figure out the pathology or injury to the joint         Note: This dictation was prepared with Dragon dictation along with smaller phrase technology. Any  transcriptional errors that result from this process are unintentional.

## 2018-10-30 ENCOUNTER — Ambulatory Visit (HOSPITAL_COMMUNITY)
Admission: RE | Admit: 2018-10-30 | Discharge: 2018-10-30 | Disposition: A | Discharge status: Home / Self Care | Payer: Medicare Other | Source: Ambulatory Visit | Attending: Orthopaedic Surgery | Admitting: Orthopaedic Surgery

## 2018-10-30 DIAGNOSIS — M25561 Pain in right knee: Secondary | ICD-10-CM | POA: Diagnosis not present

## 2018-10-30 DIAGNOSIS — G8929 Other chronic pain: Secondary | ICD-10-CM | POA: Diagnosis not present

## 2018-10-30 LAB — BASIC METABOLIC PANEL
BUN: 18 mg/dL (ref 7–25)
CO2: 26 mmol/L (ref 20–32)
Calcium: 9.4 mg/dL (ref 8.6–10.3)
Chloride: 104 mmol/L (ref 98–110)
Creat: 1.08 mg/dL (ref 0.70–1.33)
Glucose, Bld: 88 mg/dL (ref 65–99)
Potassium: 4.9 mmol/L (ref 3.5–5.3)
Sodium: 139 mmol/L (ref 135–146)

## 2018-10-30 LAB — HEMOGLOBIN A1C
Hgb A1c MFr Bld: 5.6 % of total Hgb (ref <5.7)
Mean Plasma Glucose: 114 (calc)
eAG (mmol/L): 6.3 (calc)

## 2018-11-01 ENCOUNTER — Encounter: Payer: Self-pay | Admitting: *Deleted

## 2018-11-06 ENCOUNTER — Ambulatory Visit (INDEPENDENT_AMBULATORY_CARE_PROVIDER_SITE_OTHER): Payer: Medicare Other | Admitting: Orthopaedic Surgery

## 2018-11-06 ENCOUNTER — Other Ambulatory Visit: Payer: Self-pay

## 2018-11-06 ENCOUNTER — Encounter: Payer: Self-pay | Admitting: Orthopaedic Surgery

## 2018-11-06 VITALS — BP 136/80 | HR 89 | Temp 96.8°F | Ht 74.0 in | Wt 190.0 lb

## 2018-11-06 DIAGNOSIS — M25561 Pain in right knee: Secondary | ICD-10-CM

## 2018-11-06 DIAGNOSIS — G8929 Other chronic pain: Secondary | ICD-10-CM | POA: Diagnosis not present

## 2018-11-06 MED ORDER — NAPROXEN 500 MG PO TABS: 500.0000 mg | ORAL_TABLET | Freq: Two times a day (BID) | ORAL | 5 refills | Status: DC

## 2018-11-06 NOTE — Progress Notes (Signed)
Patient [id number] Jacob Reed, male DOB:[birth year], 53 y.o. [id number]  Chief Complaint  Patient presents with  . Knee Pain    Chronic pain of right knee.    HPI  Jacob Reed is a 53 y.o. male who has right knee pain and swelling.  He had MRI which showed: IMPRESSION: 1. Limited examination due to patient motion. 2. Intact ligamentous structures and no acute bony findings. 3. No meniscal tears. 4. No significant degenerative changes. 5. Moderate to large joint effusion and medial patellar plica. 6. Mild thickening and inflammation of the quadriceps fat pad.  I have explained the findings to him.  He does not need surgery.  I will continue his Naprosyn.   Body mass index is 24.39 kg/m.  ROS  Review of Systems  Constitutional: Positive for activity change.  Musculoskeletal: Positive for arthralgias, gait problem and joint swelling.  Psychiatric/Behavioral: The patient is nervous/anxious.   All other systems reviewed and are negative.   All other systems reviewed and are negative.  The following is a summary of the past history medically, past history surgically, known current medicines, social history and family history.  This information is gathered electronically by the computer from prior information and documentation.  I review this each visit and have found including this information at this point in the chart is beneficial and informative.    Past Medical History:  Diagnosis Date  . Anxiety   . Arthritis   . Chronic neck and back pain   . Dental caries   . Sinusitis     Past Surgical History:  Procedure Laterality Date  . INGUINAL HERNIA REPAIR Right 10/01/2012   Procedure: HERNIA REPAIR INGUINAL ADULT;  Surgeon: Scherry Ran, MD;  Location: [facility];  Service: General;  Laterality: Right;  . TOOTH EXTRACTION    . VASECTOMY      Family History  Problem Relation Age of Onset  . Arthritis Mother   . Heart disease Father   . Arthritis Father      Social History Social History   Tobacco Use  . Smoking status: Former Research scientist (life sciences)  . Smokeless tobacco: Current User    Types: Snuff  Substance Use Topics  . Alcohol use: No  . Drug use: No    Allergies  Allergen Reactions  . Tramadol Hives    Current Outpatient Medications  Medication Sig Dispense Refill  . acetaminophen (TYLENOL) 500 MG tablet Take 500 mg by mouth daily as needed for moderate pain.    . naproxen (NAPROSYN) 500 MG tablet Take 1 tablet (500 mg total) by mouth 2 (two) times daily with a meal. 60 tablet 5   No current facility-administered medications for this visit.      Physical Exam  Blood pressure 136/80, pulse 89, temperature (!) 96.8 F (36 C), height 6\' 2"  (1.88 m), weight 190 lb (86.2 kg).  Constitutional: overall normal hygiene, normal nutrition, well developed, normal grooming, normal body habitus. Assistive device:none  Musculoskeletal: gait and station Limp right, muscle tone and strength are normal, no tremors or atrophy is present.  .  Neurological: coordination overall normal.  Deep tendon reflex/nerve stretch intact.  Sensation normal.  Cranial nerves II-XII intact.   Skin:   Normal overall no scars, lesions, ulcers or rashes. No psoriasis.  Psychiatric: Alert and oriented x 3.  Recent memory intact, remote memory unclear.  Normal mood and affect. Well groomed.  Good eye contact.  Cardiovascular: overall no swelling, no varicosities, no edema bilaterally, normal temperatures  of the legs and arms, no clubbing, cyanosis and good capillary refill.  Lymphatic: palpation is normal.  Right knee has slight effusion, ROM 0 to 110, stable, slight limp right.  No distal edema.  NV intact.  All other systems reviewed and are negative   The patient has been educated about the nature of the problem(s) and counseled on treatment options.  The patient appeared to understand what I have discussed and is in agreement with it.  Encounter Diagnosis   Name Primary?  . Chronic pain of right knee Yes    PLAN Call if any problems.  Precautions discussed.  Continue current medications. Continue Naprosyn, renewed.  Return to clinic 6 weeks   Electronically Signed Sanjuana Kava, MD 10/27/20209:51 AM

## 2018-11-08 ENCOUNTER — Telehealth: Payer: Self-pay | Admitting: Family Medicine

## 2018-11-08 NOTE — Telephone Encounter (Signed)
Call placed to patient and discussed lab results.   Verbalized understanding.

## 2018-11-08 NOTE — Telephone Encounter (Signed)
Patient calling to get results over the phone on his lab work  [phone]

## 2018-12-18 ENCOUNTER — Ambulatory Visit: Payer: Medicare Other | Admitting: Orthopaedic Surgery

## 2018-12-18 ENCOUNTER — Encounter: Payer: Self-pay | Admitting: Orthopaedic Surgery

## 2018-12-25 ENCOUNTER — Ambulatory Visit: Payer: Medicare Other | Admitting: Family Medicine

## 2019-02-01 ENCOUNTER — Ambulatory Visit (INDEPENDENT_AMBULATORY_CARE_PROVIDER_SITE_OTHER): Payer: Medicare Other | Admitting: Family Medicine

## 2019-02-01 ENCOUNTER — Other Ambulatory Visit: Payer: Self-pay

## 2019-02-01 ENCOUNTER — Encounter: Payer: Self-pay | Admitting: Family Medicine

## 2019-02-01 VITALS — BP 120/80 | HR 69 | Temp 97.8°F | Resp 16 | Ht 74.0 in | Wt 201.4 lb

## 2019-02-01 DIAGNOSIS — M1711 Unilateral primary osteoarthritis, right knee: Secondary | ICD-10-CM

## 2019-02-01 DIAGNOSIS — F418 Other specified anxiety disorders: Secondary | ICD-10-CM | POA: Diagnosis not present

## 2019-02-01 DIAGNOSIS — F5101 Primary insomnia: Secondary | ICD-10-CM | POA: Diagnosis not present

## 2019-02-01 MED ORDER — TRAZODONE HCL 50 MG PO TABS: 50.0000 mg | ORAL_TABLET | Freq: Every evening | ORAL | 3 refills | Status: DC | PRN

## 2019-02-01 NOTE — Progress Notes (Signed)
   Subjective:    Patient ID: Jacob Reed, male    DOB: 07/28/1965, 54 y.o.   MRN: [id number]  Patient presents for Follow-up (asked patient states he is just here to see how he is doing no specific concerns)   Pt here to f/u chronic medical problems  No speicific concerns  Medications reviewed    Normal lipids in May 2020    He is followed by Orthopedics- has chronic right knee pain  On Naprosyn   GAD/ depression- no current meds, he exercies to help with stress, he also prays which helps him He does not sleep well, he is willing to try something else to help with sleep    Weight is up 10lbs , he has been eating out a lot the past few months       Review Of Systems:  GEN- denies fatigue, fever, weight loss,weakness, recent illness HEENT- denies eye drainage, change in vision, nasal discharge, CVS- denies chest pain, palpitations RESP- denies SOB, cough, wheeze ABD- denies N/V, change in stools, abd pain GU- denies dysuria, hematuria, dribbling, incontinence MSK- denies joint pain, muscle aches, injury Neuro- denies headache, dizziness, syncope, seizure activity       Objective:    BP 120/80   Pulse 69   Temp 97.8 F (36.6 C) (Temporal)   Resp 16   Ht 6\' 2"  (1.88 m)   Wt 201 lb 6 oz (91.3 kg)   SpO2 98%   BMI 25.86 kg/m  GEN- NAD, alert and oriented x3 HEENT- PERRL, EOMI, non injected sclera, pink conjunctiva, MMM, oropharynx clear Neck- Supple, no thyromegaly CVS- RRR, no murmur RESP-CTAB Psych- normal affect and mood  Pulses- Radial 2+        Assessment & Plan:      Problem List Items Addressed This Visit      Unprioritized   Depression with anxiety    Long term history With previous issues with benzos will not prescribe again Given trazodone to try for sleep/mood      Relevant Medications   traZODone (DESYREL) 50 MG tablet   Insomnia   OA (osteoarthritis) of knee - Primary    Continue f/u with ortho as needed Has NSAID         Note: This dictation was prepared with Dragon dictation along with smaller phrase technology. Any transcriptional errors that result from this process are unintentional.

## 2019-02-01 NOTE — Patient Instructions (Signed)
F/u 4 MONTHS FOR PHYSICAL Start trazodone for sleep

## 2019-02-03 ENCOUNTER — Encounter: Payer: Self-pay | Admitting: Family Medicine

## 2019-02-03 NOTE — Assessment & Plan Note (Signed)
Long term history With previous issues with benzos will not prescribe again Given trazodone to try for sleep/mood

## 2019-02-03 NOTE — Assessment & Plan Note (Signed)
Continue f/u with ortho as needed Has NSAID

## 2019-04-08 ENCOUNTER — Other Ambulatory Visit: Payer: Medicare Other

## 2019-04-08 ENCOUNTER — Other Ambulatory Visit: Payer: Self-pay | Admitting: Family Medicine

## 2019-04-08 DIAGNOSIS — E7439 Other disorders of intestinal carbohydrate absorption: Secondary | ICD-10-CM

## 2019-04-08 DIAGNOSIS — Z79899 Other long term (current) drug therapy: Secondary | ICD-10-CM

## 2019-04-29 ENCOUNTER — Ambulatory Visit (INDEPENDENT_AMBULATORY_CARE_PROVIDER_SITE_OTHER): Payer: Medicare Other | Admitting: Family Medicine

## 2019-04-29 ENCOUNTER — Encounter: Payer: Self-pay | Admitting: Family Medicine

## 2019-04-29 ENCOUNTER — Other Ambulatory Visit: Payer: Self-pay

## 2019-04-29 VITALS — BP 126/64 | HR 74 | Temp 98.1°F | Resp 14 | Ht 74.0 in | Wt 200.0 lb

## 2019-04-29 DIAGNOSIS — Z125 Encounter for screening for malignant neoplasm of prostate: Secondary | ICD-10-CM | POA: Diagnosis not present

## 2019-04-29 DIAGNOSIS — Z136 Encounter for screening for cardiovascular disorders: Secondary | ICD-10-CM | POA: Diagnosis not present

## 2019-04-29 DIAGNOSIS — Z113 Encounter for screening for infections with a predominantly sexual mode of transmission: Secondary | ICD-10-CM

## 2019-04-29 DIAGNOSIS — Z13220 Encounter for screening for lipoid disorders: Secondary | ICD-10-CM | POA: Diagnosis not present

## 2019-04-29 DIAGNOSIS — Z0000 Encounter for general adult medical examination without abnormal findings: Secondary | ICD-10-CM

## 2019-04-29 LAB — CBC WITH DIFFERENTIAL/PLATELET
Hemoglobin: 15.2 g/dL (ref 13.2–17.1)
Lymphs Abs: 1627 cells/uL (ref 850–3900)
Platelets: 202 10*3/uL (ref 140–400)

## 2019-04-29 MED ORDER — TRAZODONE HCL 50 MG PO TABS: 50.0000 mg | ORAL_TABLET | Freq: Every evening | ORAL | 3 refills | Status: DC | PRN

## 2019-04-29 NOTE — Patient Instructions (Addendum)
CANCEL Physical for MAY  F/u 1 YEAR FOR pHYSICAL  Trazodone sent to pharmacy

## 2019-04-29 NOTE — Progress Notes (Signed)
Subjective:    Patient ID: Jacob Reed, male    DOB: 12-Jun-1965, 54 y.o.   MRN: [id number] Subjective:   Patient presents for Medicare Annual/Subsequent preventive examination.  He has no particular concerns today.  He states that he never received the trazodone but would like to try it??  Would like STD screening again. Review Past Medical/Family/Social: Per EMR    Risk Factors  Current exercise habits: walks some  Dietary issues discussed: No major issues  Cardiac risk factors: none   Depression Screen  (Note: if answer to either of the following is "Yes", a more complete depression screening is indicated)  Over the past two weeks, have you felt down, depressed or hopeless? No Over the past two weeks, have you felt little interest or pleasure in doing things? No Have you lost interest or pleasure in daily life? No Do you often feel hopeless? No Do you cry easily over simple problems? No   Activities of Daily Living  In your present state of health, do you have any difficulty performing the following activities?:  Driving? No  Managing money? No  Feeding yourself? No  Getting from bed to chair? No  Climbing a flight of stairs? No  Preparing food and eating?: No  Bathing or showering? No  Getting dressed: No  Getting to the toilet? No  Using the toilet:No  Moving around from place to place: No  In the past year have you fallen or had a near fall?:No  Are you sexually active? No  Do you have more than one partner? No   Hearing Difficulties: No  Do you often ask people to speak up or repeat themselves? No  Do you experience ringing or noises in your ears? No Do you have difficulty understanding soft or whispered voices? No  Do you feel that you have a problem with memory? No Do you often misplace items? No  Do you feel safe at home? Yes  Cognitive Testing  Alert? Yes Normal Appearance?Yes  Oriented to person? Yes Place? Yes  Time? Yes  Recall of three objects?  Yes  Can perform simple calculations? Yes  Displays appropriate judgment?Yes  Can read the correct time from a watch face?Yes   List the Names of Other Physician/Practitioners you currently use:  Orthopedics    Screening Tests / Date Colonoscopy - Declined  COVID-19 vaccine- Decline                     Influenza Vaccine Declined  Shingles- Declined  Tetanus/tdap 2019 UTD   GEN- denies fatigue, fever, weight loss,weakness, recent illness HEENT- denies eye drainage, change in vision, nasal discharge, CVS- denies chest pain, palpitations RESP- denies SOB, cough, wheeze ABD- denies N/V, change in stools, abd pain GU- denies dysuria, hematuria, dribbling, incontinence MSK- denies joint pain, muscle aches, injury Neuro- denies headache, dizziness, syncope, seizure activity   Physical: vitals reviewed  GEN- NAD, alert and oriented x3 HEENT- PERRL, EOMI, non injected sclera, pink conjunctiva, MMM, oropharynx clear Neck- Supple, no thryomegaly CVS- RRR, no murmur RESP-CTAB ABD-NABS,soft,NT,ND  EXT- No edema Pulses- Radial, DP- 2+   Assessment:    Annual wellness medicare exam   Plan:    During the course of the visit the patient was educated and counseled about appropriate screening and preventive services including:   STD screening screening to be done. Patient declines colonoscopy or Cologuard Clines immunizations including COVID-19 vaccine  Fasting labs obtained including PSA which I discussed with  him.  Chronic insomnia with some anxiety mood issues I have sent trazodone to the pharmacy again for him to try.  Unclear if he actually never received the medication or just did not take it.  Right knee pain per orthopedics  Fall/CAGE screen neg  PHQ score 5     Diet review for nutrition referral? Yes ____ Not Indicated __x__  Patient Instructions (the written plan) was given to the patient.  Medicare Attestation  I have personally reviewed:  The patient's  medical and social history  Their use of alcohol, tobacco or illicit drugs  Their current medications and supplements  The patient's functional ability including ADLs,fall risks, home safety risks, cognitive, and hearing and visual impairment  Diet and physical activities  Evidence for depression or mood disorders  The patient's weight, height, BMI, and visual acuity have been recorded in the chart. I have made referrals, counseling, and provided education to the patient based on review of the above and I have provided the patient with a written personalized care plan for preventive services.

## 2019-04-30 LAB — COMPREHENSIVE METABOLIC PANEL
AG Ratio: 1.9 (calc) (ref 1.0–2.5)
ALT: 21 U/L (ref 9–46)
AST: 24 U/L (ref 10–35)
Albumin: 4.5 g/dL (ref 3.6–5.1)
Alkaline phosphatase (APISO): 99 U/L (ref 35–144)
BUN: 12 mg/dL (ref 7–25)
CO2: 26 mmol/L (ref 20–32)
Calcium: 9.4 mg/dL (ref 8.6–10.3)
Chloride: 104 mmol/L (ref 98–110)
Creat: 1.27 mg/dL (ref 0.70–1.33)
Globulin: 2.4 g/dL (calc) (ref 1.9–3.7)
Glucose, Bld: 100 mg/dL — ABNORMAL HIGH (ref 65–99)
Potassium: 4.9 mmol/L (ref 3.5–5.3)
Sodium: 139 mmol/L (ref 135–146)
Total Bilirubin: 0.7 mg/dL (ref 0.2–1.2)
Total Protein: 6.9 g/dL (ref 6.1–8.1)

## 2019-04-30 LAB — HIV ANTIBODY (ROUTINE TESTING W REFLEX): HIV 1&2 Ab, 4th Generation: NONREACTIVE

## 2019-04-30 LAB — LIPID PANEL
Cholesterol: 157 mg/dL (ref <200)
HDL: 41 mg/dL (ref >=40)
LDL Cholesterol (Calc): 92 mg/dL (calc)
Non-HDL Cholesterol (Calc): 116 mg/dL (calc) (ref <130)
Total CHOL/HDL Ratio: 3.8 (calc) (ref <5.0)
Triglycerides: 147 mg/dL (ref <150)

## 2019-04-30 LAB — CBC WITH DIFFERENTIAL/PLATELET
Absolute Monocytes: 511 cells/uL (ref 200–950)
Basophils Absolute: 43 cells/uL (ref 0–200)
Basophils Relative: 0.6 %
Eosinophils Absolute: 101 cells/uL (ref 15–500)
Eosinophils Relative: 1.4 %
HCT: 46 % (ref 38.5–50.0)
MCH: 29.6 pg (ref 27.0–33.0)
MCHC: 33 g/dL (ref 32.0–36.0)
MCV: 89.7 fL (ref 80.0–100.0)
MPV: 11.3 fL (ref 7.5–12.5)
Monocytes Relative: 7.1 %
Neutro Abs: 4918 cells/uL (ref 1500–7800)
Neutrophils Relative %: 68.3 %
RBC: 5.13 10*6/uL (ref 4.20–5.80)
RDW: 13 % (ref 11.0–15.0)
Total Lymphocyte: 22.6 %
WBC: 7.2 10*3/uL (ref 3.8–10.8)

## 2019-04-30 LAB — RPR: RPR Ser Ql: NONREACTIVE

## 2019-04-30 LAB — PSA: PSA: 2.1 ng/mL (ref <=4.0)

## 2019-05-01 LAB — TRICHOMONAS VAGINALIS RNA, QL,MALES: Trichomonas vaginalis RNA: NOT DETECTED

## 2019-05-01 LAB — C. TRACHOMATIS/N. GONORRHOEAE RNA
C. trachomatis RNA, TMA: NOT DETECTED
N. gonorrhoeae RNA, TMA: NOT DETECTED

## 2019-06-03 ENCOUNTER — Encounter: Payer: Medicare Other | Admitting: Family Medicine

## 2019-07-17 ENCOUNTER — Emergency Department (HOSPITAL_COMMUNITY)
Admission: EM | Admit: 2019-07-17 | Discharge: 2019-07-17 | Disposition: A | Discharge status: Home / Self Care | Payer: Medicare Other | Attending: Emergency Medicine | Admitting: Emergency Medicine

## 2019-07-17 ENCOUNTER — Other Ambulatory Visit: Payer: Self-pay

## 2019-07-17 ENCOUNTER — Encounter (HOSPITAL_COMMUNITY): Payer: Self-pay | Admitting: Emergency Medicine

## 2019-07-17 DIAGNOSIS — Y9289 Other specified places as the place of occurrence of the external cause: Secondary | ICD-10-CM | POA: Insufficient documentation

## 2019-07-17 DIAGNOSIS — S60551A Superficial foreign body of right hand, initial encounter: Secondary | ICD-10-CM | POA: Diagnosis not present

## 2019-07-17 DIAGNOSIS — Y9389 Activity, other specified: Secondary | ICD-10-CM | POA: Insufficient documentation

## 2019-07-17 DIAGNOSIS — Z87891 Personal history of nicotine dependence: Secondary | ICD-10-CM | POA: Insufficient documentation

## 2019-07-17 DIAGNOSIS — Y999 Unspecified external cause status: Secondary | ICD-10-CM | POA: Insufficient documentation

## 2019-07-17 DIAGNOSIS — Y939 Activity, unspecified: Secondary | ICD-10-CM | POA: Diagnosis not present

## 2019-07-17 DIAGNOSIS — M795 Residual foreign body in soft tissue: Secondary | ICD-10-CM | POA: Diagnosis not present

## 2019-07-17 DIAGNOSIS — X16XXXA Contact with hot heating appliances, radiators and pipes, initial encounter: Secondary | ICD-10-CM | POA: Diagnosis not present

## 2019-07-17 NOTE — ED Provider Notes (Signed)
[facility] Emergency Department Provider Note MRN:  [id number]  Arrival date & time: 07/17/19     Chief Complaint   foreign body right hand   History of Present Illness   Jacob Reed is a 54 y.o. year-old male with a history of chronic back pain presenting to the ED with chief complaint of foreign body in hand.  Patient explains that he was at work and he picked up an Web designer without gloves and now he feels a foreign body sensation in his right hand.  Denies any other injuries, no falls.  Symptoms are mild, constant, no exacerbating relieving factors.  Described as a mild pain.  Review of Systems  A complete 10 system review of systems was obtained and all systems are negative except as noted in the HPI and PMH.   Patient's Health History    Past Medical History:  Diagnosis Date   Anxiety    Arthritis    Chronic neck and back pain    Dental caries    Sinusitis     Past Surgical History:  Procedure Laterality Date   INGUINAL HERNIA REPAIR Right 10/01/2012   Procedure: HERNIA REPAIR INGUINAL ADULT;  Surgeon: Scherry Ran, MD;  Location: [facility];  Service: General;  Laterality: Right;   TOOTH EXTRACTION     VASECTOMY      Family History  Problem Relation Age of Onset   Arthritis Mother    Heart disease Father    Arthritis Father     Social History   Socioeconomic History   Marital status: Legally Separated    Spouse name: Not on file   Number of children: Not on file   Years of education: Not on file   Highest education level: Not on file  Occupational History   Not on file  Tobacco Use   Smoking status: Former Smoker   Smokeless tobacco: Current User    Types: Snuff  Vaping Use   Vaping Use: Never used  Substance and Sexual Activity   Alcohol use: No   Drug use: No   Sexual activity: Yes  Other Topics Concern   Not on file  Social History Narrative   Not on file   Social Determinants of  Health   Financial Resource Strain:    Difficulty of Paying Living Expenses:   Food Insecurity:    Worried About Charity fundraiser in the Last Year:    Arboriculturist in the Last Year:   Transportation Needs:    Film/video editor (Medical):    Lack of Transportation (Non-Medical):   Physical Activity:    Days of Exercise per Week:    Minutes of Exercise per Session:   Stress:    Feeling of Stress :   Social Connections:    Frequency of Communication with Friends and Family:    Frequency of Social Gatherings with Friends and Family:    Attends Religious Services:    Active Member of Clubs or Organizations:    Attends Archivist Meetings:    Marital Status:   Intimate Partner Violence:    Fear of Current or Ex-Partner:    Emotionally Abused:    Physically Abused:    Sexually Abused:      Physical Exam   Vitals:   07/17/19 0232  BP: 124/82  Pulse: 74  Resp: 18  Temp: 98.8 F (37.1 C)  SpO2: 99%    CONSTITUTIONAL: Well-appearing, NAD NEURO:  Alert  and oriented x 3, no focal deficits EYES:  eyes equal and reactive ENT/NECK:  no LAD, no JVD CARDIO: Regular rate, well-perfused, normal S1 and S2 PULM:  CTAB no wheezing or rhonchi GI/GU:  normal bowel sounds, non-distended, non-tender MSK/SPINE:  No gross deformities, no edema SKIN: Abrasion to the right thenar eminence PSYCH:  Appropriate speech and behavior  *Additional and/or pertinent findings included in MDM below  Diagnostic and Interventional Summary    EKG Interpretation  Date/Time:    Ventricular Rate:    PR Interval:    QRS Duration:   QT Interval:    QTC Calculation:   R Axis:     Text Interpretation:        Labs Reviewed - No data to display  No orders to display    Medications - No data to display   Procedures  /  Critical Care Procedures  ED Course and Medical Decision Making  I have reviewed the triage vital signs, the nursing notes, and pertinent  available records from the EMR.  Listed above are laboratory and imaging tests that I personally ordered, reviewed, and interpreted and then considered in my medical decision making (see below for details).      Upon my initial evaluation patient shows me the foreign body that he pulled out of his hand while he was waiting for evaluation.  I palpated the wound and I do not feel any other foreign body.  Appears to be a piece of wood that he took out.  I recommended x-ray to exclude any remaining foreign body but he declined this, would like to go home.  Advised return to the emergency department with any worsening of condition.  Tetanus up-to-date.    Barth Kirks. Sedonia Small, MD [place] [email or web address]  Final Clinical Impressions(s) / ED Diagnoses     ICD-10-CM   1. Foreign body (FB) in soft tissue  M79.5     ED Discharge Orders    None       Discharge Instructions Discussed with and Provided to Patient:     Discharge Instructions     You were evaluated in the Emergency Department and after careful evaluation, we did not find any emergent condition requiring admission or further testing in the hospital.  Your exam/testing today is overall reassuring.  Please return to the Emergency Department if you experience any worsening of your condition.  We encourage you to follow up with a primary care provider.  Thank you for allowing [place] to be a part of your care.      Maudie Flakes, MD 07/17/19 [phone]

## 2019-07-17 NOTE — ED Notes (Signed)
Pt ambulatory to waiting room. Pt verbalized understanding of discharge instructions.   

## 2019-07-17 NOTE — Discharge Instructions (Addendum)
You were evaluated in the Emergency Department and after careful evaluation, we did not find any emergent condition requiring admission or further testing in the hospital.  Your exam/testing today is overall reassuring.  Please return to the Emergency Department if you experience any worsening of your condition.  We encourage you to follow up with a primary care provider.  Thank you for allowing us to be a part of your care. 

## 2019-07-17 NOTE — ED Triage Notes (Signed)
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 Right hand is red inside the palm and swollen.

## 2019-11-05 IMAGING — DX DG ELBOW COMPLETE 3+V*L*
4 series · 4 of 4 positions shown · non-contrast
Comparison: None.

CLINICAL DATA: Left elbow pain, posterior and ulnar side worse.
left arm caught between a tv and the wall. Redness noted. Initial
encounter.

EXAM:
LEFT ELBOW - COMPLETE 3+ VIEW

[elbow obl (1 of 3)]
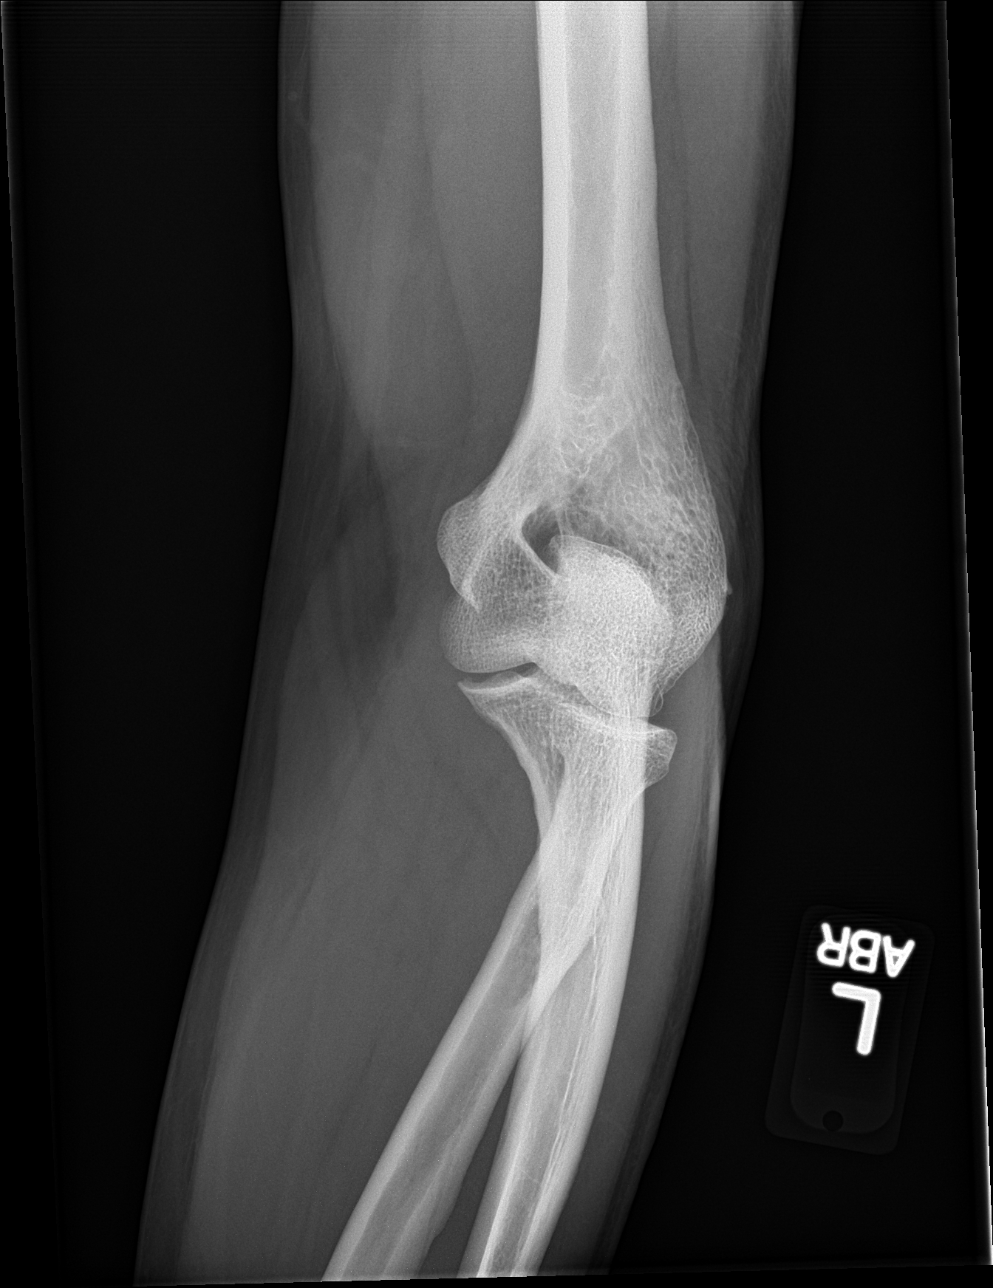

[elbow obl (2 of 3)]
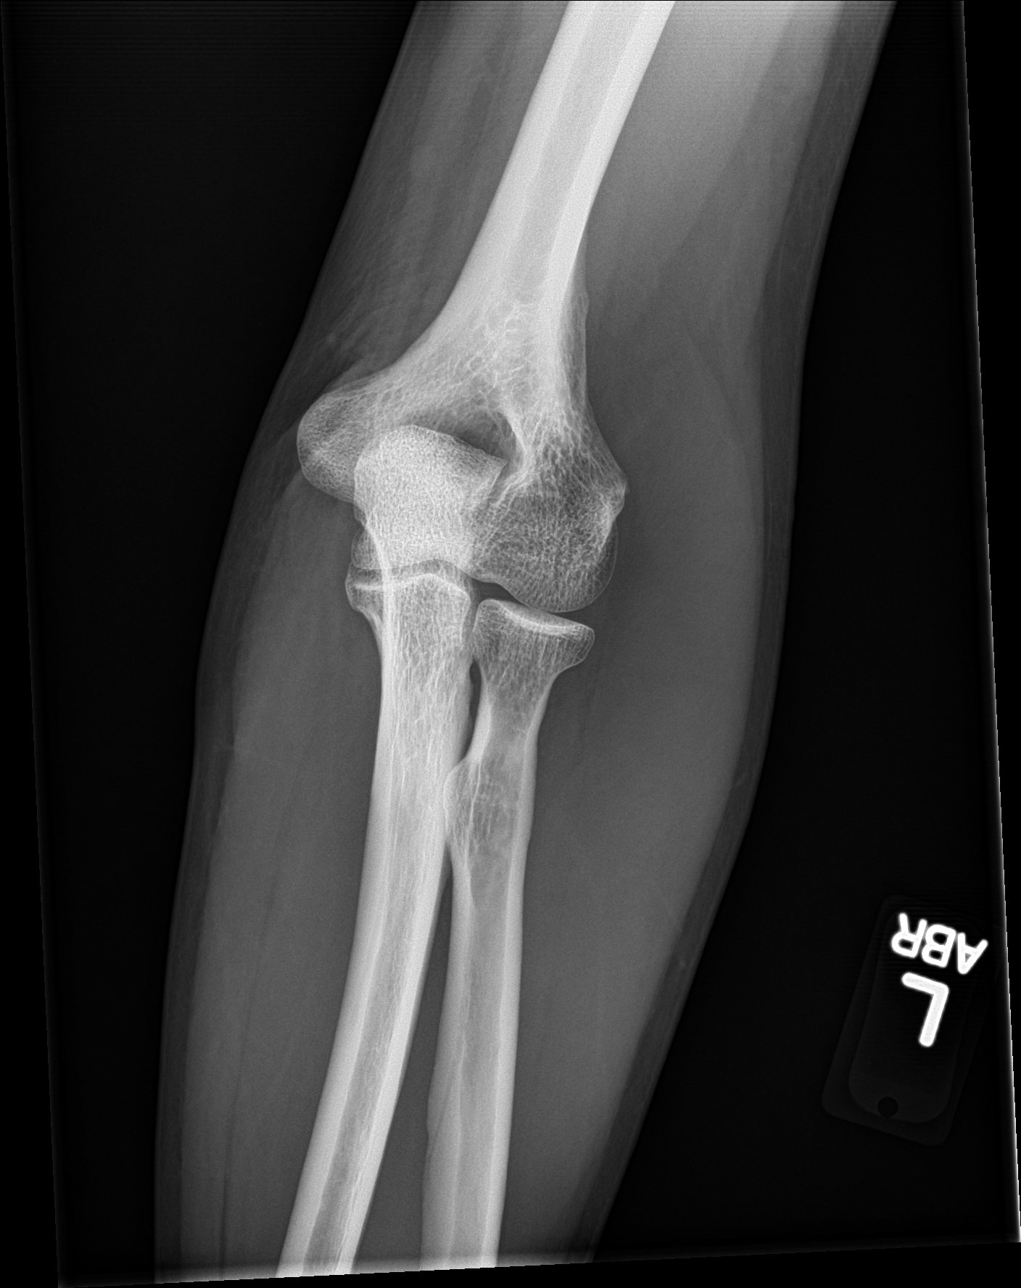

[elbow lat]
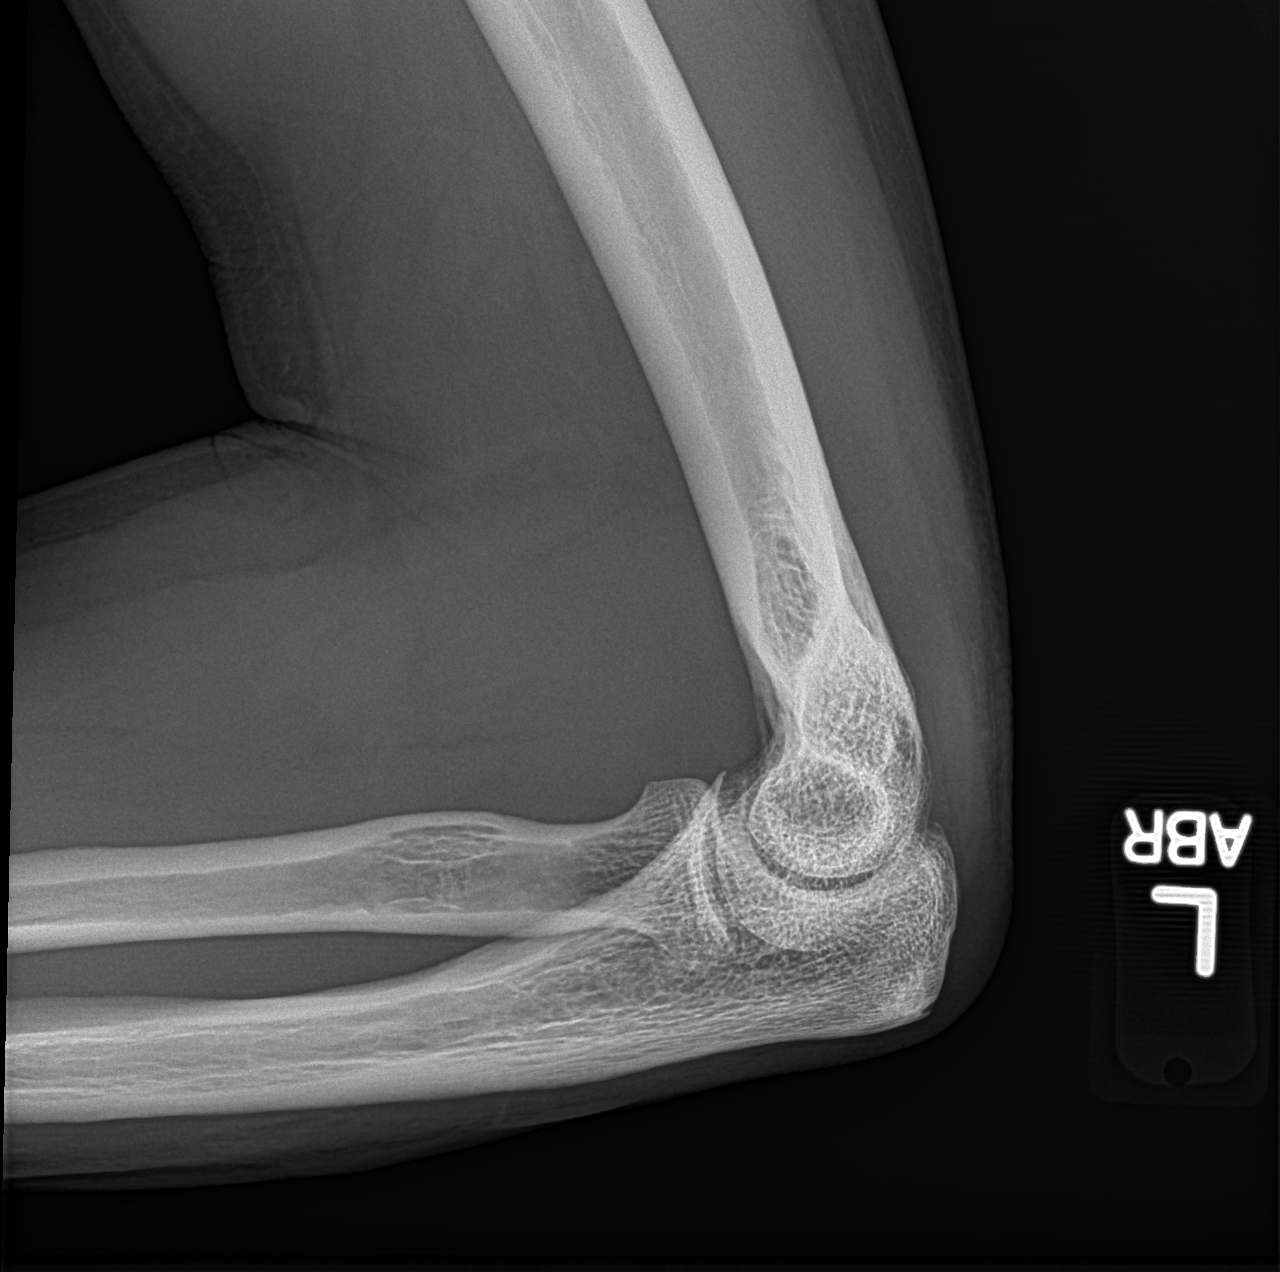

[elbow obl (3 of 3)]
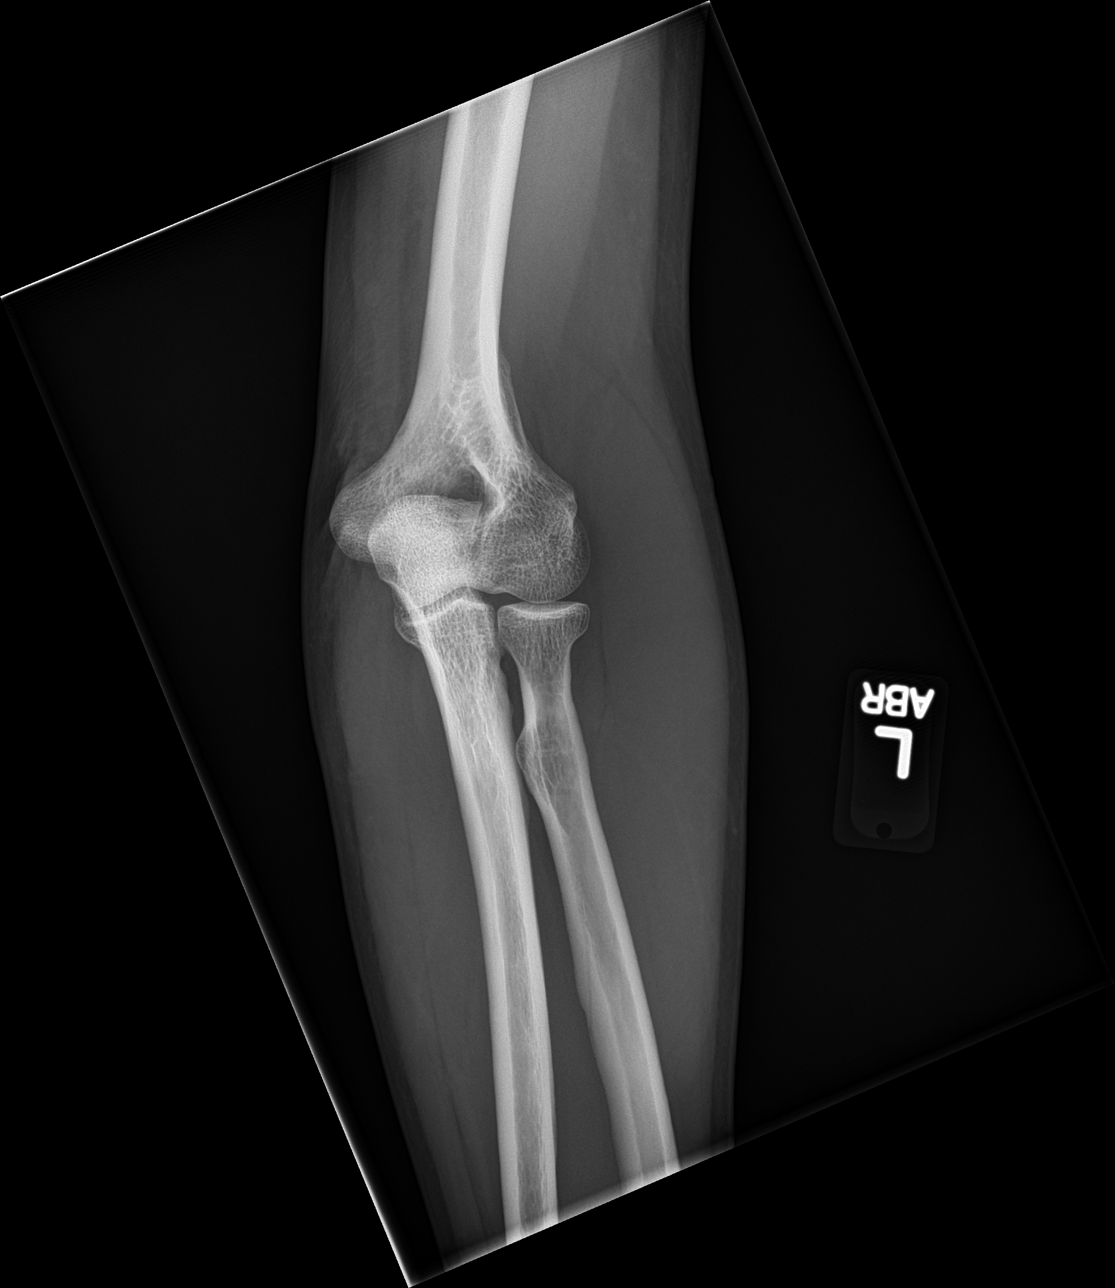

[4 of 4 positions shown; findings below may reference images not displayed]

FINDINGS: There is no evidence of fracture, dislocation, or joint effusion.
There is no evidence of arthropathy or other focal bone abnormality.
Soft tissues are unremarkable.
IMPRESSION: Negative.

## 2020-04-06 ENCOUNTER — Other Ambulatory Visit: Payer: Self-pay

## 2020-04-06 ENCOUNTER — Emergency Department (HOSPITAL_COMMUNITY)
Admission: EM | Admit: 2020-04-06 | Discharge: 2020-04-06 | Disposition: A | Discharge status: Home / Self Care | Payer: Medicare Other | Attending: Emergency Medicine | Admitting: Emergency Medicine

## 2020-04-06 ENCOUNTER — Encounter (HOSPITAL_COMMUNITY): Payer: Self-pay | Admitting: *Deleted

## 2020-04-06 DIAGNOSIS — Z87891 Personal history of nicotine dependence: Secondary | ICD-10-CM | POA: Insufficient documentation

## 2020-04-06 DIAGNOSIS — X500XXA Overexertion from strenuous movement or load, initial encounter: Secondary | ICD-10-CM | POA: Insufficient documentation

## 2020-04-06 DIAGNOSIS — Y93F2 Activity, caregiving, lifting: Secondary | ICD-10-CM | POA: Insufficient documentation

## 2020-04-06 DIAGNOSIS — S39012A Strain of muscle, fascia and tendon of lower back, initial encounter: Secondary | ICD-10-CM | POA: Insufficient documentation

## 2020-04-06 DIAGNOSIS — S3992XA Unspecified injury of lower back, initial encounter: Secondary | ICD-10-CM | POA: Diagnosis present

## 2020-04-06 MED ORDER — METHOCARBAMOL 500 MG PO TABS
500.0000 mg | ORAL_TABLET | Freq: Once | ORAL | Status: AC
  Administered 2020-04-06: 500 mg via ORAL
  Filled 2020-04-06: qty 1

## 2020-04-06 MED ORDER — NAPROXEN 250 MG PO TABS
500.0000 mg | ORAL_TABLET | Freq: Once | ORAL | Status: AC
  Administered 2020-04-06: 500 mg via ORAL
  Filled 2020-04-06: qty 2

## 2020-04-06 MED ORDER — METHOCARBAMOL 500 MG PO TABS: 500.0000 mg | ORAL_TABLET | Freq: Three times a day (TID) | ORAL | 0 refills | Status: DC

## 2020-04-06 MED ORDER — NAPROXEN 500 MG PO TABS: 500.0000 mg | ORAL_TABLET | Freq: Two times a day (BID) | ORAL | 0 refills | Status: DC

## 2020-04-06 NOTE — ED Triage Notes (Signed)
Pt with lower back pain since this morning.  Pt admits to cleaning out a lot of storage units recently.  Has taken tylenol at home.

## 2020-04-06 NOTE — Discharge Instructions (Addendum)
Alternate ice and heat to your lower back.  Avoid twisting, bending or heavy lifting for 7 days.  Follow-up with the clinic listed if needed, return here if your symptoms worsen

## 2020-04-06 NOTE — ED Provider Notes (Signed)
[facility] EMERGENCY DEPARTMENT Provider Note   CSN: [id number] Arrival date & time: 04/06/20  1951     History Chief Complaint  Patient presents with  . Back Pain    Jacob Reed is a 55 y.o. male.  HPI      Jacob Reed is a 55 y.o. male with past medical history of chronic neck and low back pain, who presents to the Emergency Department complaining of low back pain that began earlier today after lifting heavy boxes.  He states that he has been cleaning out storage units recently and believes this has contributed to his back pain.  He has been taking Tylenol at home without relief.  He describes an aching pain to the middle portion of his lower back.  Pain is aggravated with movement and improves at rest.  He denies any numbness or weakness of his lower extremities, abdominal pain, urine or bowel changes, and pain radiating into his lower extremities.  No recent fever or chills.  He states pain is similar to previous episodes of "pulled muscles in my back."   Past Medical History:  Diagnosis Date  . Anxiety   . Arthritis   . Chronic neck and back pain   . Dental caries   . Sinusitis     Patient Active Problem List   Diagnosis Date Noted  . Glucose intolerance 10/29/2018  . OA (osteoarthritis) of knee 10/29/2018  . 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 DDD (degenerative disc disease), lumbar 09/22/2015  . DDD (degenerative disc disease), cervical 09/22/2015  . Insomnia 06/29/2013  . Tinnitus 06/26/2013  . Dizziness and giddiness 04/24/2013  . External hemorrhoid 04/24/2013  . Depression with anxiety 11/13/2011  . Arthritis 11/13/2011  . Seasonal allergies 11/13/2011    Past Surgical History:  Procedure Laterality Date  . INGUINAL HERNIA REPAIR Right 10/01/2012   Procedure: HERNIA REPAIR INGUINAL ADULT;  Surgeon: Scherry Ran, MD;  Location: [facility];  Service: General;  Laterality: Right;  . TOOTH EXTRACTION    . VASECTOMY          Family History  Problem Relation Age of Onset  . Arthritis Mother   . Heart disease Father   . Arthritis Father     Social History   Tobacco Use  . Smoking status: Former Research scientist (life sciences)  . Smokeless tobacco: Current User    Types: Snuff  Vaping Use  . Vaping Use: Never used  Substance Use Topics  . Alcohol use: No  . Drug use: No    Home Medications Prior to Admission medications   Medication Sig Start Date End Date Taking? Authorizing Provider  methocarbamol (ROBAXIN) 500 MG tablet Take 1 tablet (500 mg total) by mouth 3 (three) times daily. 04/06/20  Yes Chee Kinslow, PA-C  naproxen (NAPROSYN) 500 MG tablet Take 1 tablet (500 mg total) by mouth 2 (two) times daily. Take with food 04/06/20  Yes Bayan Kushnir, PA-C  acetaminophen (TYLENOL) 500 MG tablet Take 500 mg by mouth daily as needed for moderate pain.    [provider]  loratadine (CLARITIN) 10 MG tablet Take 10 mg by mouth daily.    [provider]  traZODone (DESYREL) 50 MG tablet Take 1 tablet (50 mg total) by mouth at bedtime as needed for sleep. 04/29/19   Alycia Rossetti, MD    Allergies    Tramadol  Review of Systems   Review of Systems  Constitutional: Negative for fever.  Respiratory: Negative for  shortness of breath.   Gastrointestinal: Negative for abdominal pain, constipation and vomiting.  Genitourinary: Negative for decreased urine volume, difficulty urinating, dysuria, flank pain and hematuria.  Musculoskeletal: Positive for back pain. Negative for joint swelling.  Skin: Negative for rash.  Neurological: Negative for dizziness, weakness, numbness and headaches.    Physical Exam Updated Vital Signs BP 121/86 (BP Location: Left Arm)   Pulse 86   Temp 98.1 F (36.7 C) (Oral)   Resp 18   Ht 6\' 2"  (1.88 m)   Wt 90.7 kg   SpO2 99%   BMI 25.68 kg/m   Physical Exam Vitals and nursing note reviewed.  Constitutional:      Appearance: Normal appearance. He is not  ill-appearing or toxic-appearing.  HENT:     Head: Normocephalic.  Neck:     Thyroid: No thyromegaly.     Meningeal: Kernig's sign absent.  Cardiovascular:     Rate and Rhythm: Normal rate and regular rhythm.     Pulses: Normal pulses.  Pulmonary:     Effort: Pulmonary effort is normal.     Breath sounds: Normal breath sounds. No wheezing.  Abdominal:     Palpations: Abdomen is soft.     Tenderness: There is no abdominal tenderness. There is no guarding or rebound.  Musculoskeletal:        General: Normal range of motion.     Cervical back: Normal range of motion. No tenderness.     Lumbar back: No swelling. Normal range of motion. Negative right straight leg raise test and negative left straight leg raise test.     Right lower leg: No edema.     Left lower leg: No edema.     Comments: Mild tenderness of the lower lumbar spine and right lower lumbar paraspinal muscles.  Negative straight leg raise bilaterally.   Skin:    General: Skin is warm.     Capillary Refill: Capillary refill takes less than 2 seconds.     Findings: No rash.  Neurological:     Mental Status: He is alert and oriented to person, place, and time.     ED Results / Procedures / Treatments   Labs (all labs ordered are listed, but only abnormal results are displayed) Labs Reviewed - No data to display  EKG None  Radiology No results found.  Procedures Procedures   Medications Ordered in ED Medications  methocarbamol (ROBAXIN) tablet 500 mg (500 mg Oral Given 04/06/20 2112)  naproxen (NAPROSYN) tablet 500 mg (500 mg Oral Given 04/06/20 2112)    ED Course  I have reviewed the triage vital signs and the nursing notes.  Pertinent labs & imaging results that were available during my care of the patient were reviewed by me and considered in my medical decision making (see chart for details).    MDM Rules/Calculators/A&P                          Patient here with low back pain secondary to lifting  heavy boxes earlier today.  History of chronic low back pain.  No reported fall, urine or bowel incontinence abdominal pain or radicular symptoms.  On exam, he is well-appearing, ambulatory with steady gait.  No focal neuro deficits.  No concerning symptoms for cauda equina. Symptoms felt to be musculoskeletal.  No indication for emergent imaging at this time.  Patient agrees to conservative treatment with ice, heat, NSAIDs and muscle relaxer.  He appears appropriate  for discharge home, agrees to outpatient follow-up if symptoms not improving.  Return precautions were discussed.   Final Clinical Impression(s) / ED Diagnoses Final diagnoses:  Strain of lumbar region, initial encounter    Rx / DC Orders ED Discharge Orders         Ordered    naproxen (NAPROSYN) 500 MG tablet  2 times daily        04/06/20 2101    methocarbamol (ROBAXIN) 500 MG tablet  3 times daily        04/06/20 2101           Kem Parkinson, PA-C 04/06/20 2339    Noemi Chapel, MD 04/11/20 [phone]

## 2020-04-06 NOTE — ED Notes (Signed)
Pt ambulatory to er room number 21, pt c/o back pain, pt states that he was moving some boxes this am and when he stood up had a sudden onset of pain, denies numbness or tingling in his legs, denies loss of bowel or bladder function.  Pt awaits md eval.

## 2020-04-29 ENCOUNTER — Encounter: Payer: Medicare Other | Admitting: Family Medicine

## 2020-06-10 ENCOUNTER — Ambulatory Visit (INDEPENDENT_AMBULATORY_CARE_PROVIDER_SITE_OTHER): Payer: Medicare Other | Admitting: Internal Medicine

## 2020-06-10 ENCOUNTER — Encounter: Payer: Self-pay | Admitting: Internal Medicine

## 2020-06-10 ENCOUNTER — Other Ambulatory Visit: Payer: Self-pay

## 2020-06-10 VITALS — BP 124/73 | HR 87 | Temp 98.5°F | Ht 74.0 in | Wt 193.0 lb

## 2020-06-10 DIAGNOSIS — J302 Other seasonal allergic rhinitis: Secondary | ICD-10-CM | POA: Diagnosis not present

## 2020-06-10 DIAGNOSIS — Z125 Encounter for screening for malignant neoplasm of prostate: Secondary | ICD-10-CM

## 2020-06-10 DIAGNOSIS — Z7689 Persons encountering health services in other specified circumstances: Secondary | ICD-10-CM | POA: Diagnosis not present

## 2020-06-10 DIAGNOSIS — Z2821 Immunization not carried out because of patient refusal: Secondary | ICD-10-CM | POA: Diagnosis not present

## 2020-06-10 DIAGNOSIS — M5136 Other intervertebral disc degeneration, lumbar region: Secondary | ICD-10-CM | POA: Diagnosis not present

## 2020-06-10 DIAGNOSIS — Z5320 Procedure and treatment not carried out because of patient's decision for unspecified reasons: Secondary | ICD-10-CM | POA: Diagnosis not present

## 2020-06-10 DIAGNOSIS — M1990 Unspecified osteoarthritis, unspecified site: Secondary | ICD-10-CM | POA: Diagnosis not present

## 2020-06-10 DIAGNOSIS — Z113 Encounter for screening for infections with a predominantly sexual mode of transmission: Secondary | ICD-10-CM

## 2020-06-10 DIAGNOSIS — F418 Other specified anxiety disorders: Secondary | ICD-10-CM

## 2020-06-10 MED ORDER — TRAZODONE HCL 50 MG PO TABS
50.0000 mg | ORAL_TABLET | Freq: Every evening | ORAL | 3 refills | Status: DC | PRN
Start: 2020-06-10 — End: 2020-10-16

## 2020-06-10 NOTE — Assessment & Plan Note (Signed)
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

## 2020-06-10 NOTE — Progress Notes (Signed)
New Patient Office Visit  Subjective:  Patient ID: Jacob Reed, male    DOB: 09-14-1965  Age: 55 y.o. MRN: [id number]  CC:  Chief Complaint  Patient presents with  . New Patient (Initial Visit)    Here to establish care. No complaints today.    HPI Jacob Reed is a 55 year old male with PMH of diffuse OA, DDD of lumbar spine and depression with anxiety who presents for establishing care. He is a former patient of Dr Buelah Manis.  He had been taking Trazodone for depression ith anxiety and insomnia. It is not clear whether he is actively taking the medication, as patient states that he tried it in the past and later said, he had it yesterday. Denies SI or HI.  He has h/o chronic low back pain, for which he was evaluated by Spine surgery in the past. He states that he was told that he is not a surgical candidate as he had arthritis in addition to spinal stenosis. Denies any numbness or tingling in the LE. He asks whether he can get Valium for back pain.  He denies any screening test for now including colonoscopy.  He denies taking any vaccine.  Past Medical History:  Diagnosis Date  . Anxiety   . Arthritis   . Chronic neck and back pain   . Dental caries   . Sinusitis     Past Surgical History:  Procedure Laterality Date  . INGUINAL HERNIA REPAIR Right 10/01/2012   Procedure: HERNIA REPAIR INGUINAL ADULT;  Surgeon: Scherry Ran, MD;  Location: [facility];  Service: General;  Laterality: Right;  . TOOTH EXTRACTION    . VASECTOMY      Family History  Problem Relation Age of Onset  . Arthritis Mother   . Heart disease Father   . Arthritis Father     Social History   Socioeconomic History  . Marital status: Legally Separated    Spouse name: Not on file  . Number of children: Not on file  . Years of education: Not on file  . Highest education level: Not on file  Occupational History  . Not on file  Tobacco Use  . Smoking status: Former Research scientist (life sciences)  . Smokeless  tobacco: Current User    Types: Snuff  Vaping Use  . Vaping Use: Never used  Substance and Sexual Activity  . Alcohol use: No  . Drug use: No  . Sexual activity: Yes  Other Topics Concern  . Not on file  Social History Narrative  . Not on file   Social Determinants of Health   Financial Resource Strain: Not on file  Food Insecurity: Not on file  Transportation Needs: Not on file  Physical Activity: Not on file  Stress: Not on file  Social Connections: Not on file  Intimate Partner Violence: Not on file    ROS Review of Systems  Constitutional: Negative for chills and fever.  HENT: Negative for congestion and sore throat.   Eyes: Negative for pain and discharge.  Respiratory: Negative for cough and shortness of breath.   Cardiovascular: Negative for chest pain and palpitations.  Gastrointestinal: Negative for constipation, diarrhea, nausea and vomiting.  Endocrine: Negative for polydipsia and polyuria.  Genitourinary: Negative for dysuria and hematuria.  Musculoskeletal: Positive for arthralgias and back pain. Negative for neck pain and neck stiffness.  Skin: Negative for rash.  Neurological: Negative for dizziness, weakness, numbness and headaches.  Psychiatric/Behavioral: Positive for sleep disturbance. Negative for agitation and behavioral  problems. The patient is nervous/anxious.     Objective:   Today's Vitals: BP 124/73 (BP Location: Right Arm, Patient Position: Sitting, Cuff Size: Large)   Pulse 87   Temp 98.5 F (36.9 C) (Temporal)   Ht 6' 2" (1.88 m)   Wt 193 lb (87.5 kg)   SpO2 97%   BMI 24.78 kg/m   Physical Exam Vitals reviewed.  Constitutional:      General: He is not in acute distress.    Appearance: He is not diaphoretic.  HENT:     Head: Normocephalic and atraumatic.     Nose: Nose normal.     Mouth/Throat:     Mouth: Mucous membranes are moist.  Eyes:     General: No scleral icterus.    Extraocular Movements: Extraocular movements  intact.  Cardiovascular:     Rate and Rhythm: Normal rate and regular rhythm.     Pulses: Normal pulses.     Heart sounds: Normal heart sounds. No murmur heard.   Pulmonary:     Breath sounds: Normal breath sounds. No wheezing or rales.  Musculoskeletal:     Cervical back: Neck supple. No tenderness.     Right lower leg: No edema.     Left lower leg: No edema.  Skin:    General: Skin is warm.     Findings: No rash.  Neurological:     General: No focal deficit present.     Mental Status: He is alert and oriented to person, place, and time.  Psychiatric:        Mood and Affect: Mood normal.        Behavior: Behavior normal.     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) Tylenol PRN for now      DDD (degenerative disc disease), lumbar    MRI lumbar spine reviewed - disc protrusion and lateral recess stenosis at L3-L4 Has had Spine surgery evaluation in the past Tylenol PRN If persistent, will refer to pain clinic         Other   Depression with anxiety    Long-term history Has had Benzos in the past, previous PCP stopped it Continue Trazodone PRN      Relevant Medications   traZODone (DESYREL) 50 MG tablet   Other Relevant Orders   TSH   Vitamin D (25 hydroxy)   Seasonal allergies    Continue Claritin       Other Visit Diagnoses       Screen for STD (sexually transmitted disease)       Relevant Orders   STD Screen (6)   Screening for prostate cancer       Relevant Orders   PSA   COVID-19 vaccination refused       Colonoscopy refused          Outpatient Encounter Medications as of 06/10/2020  Medication Sig  .  acetaminophen (TYLENOL) 500 MG tablet Take 500 mg by mouth daily as needed for moderate pain.  Marland Kitchen loratadine (CLARITIN) 10 MG tablet Take 10 mg by mouth daily.  . traZODone (DESYREL) 50 MG tablet Take 1 tablet (50  mg total) by mouth at bedtime as needed for sleep.  . [DISCONTINUED] methocarbamol (ROBAXIN) 500 MG tablet Take 1 tablet (500 mg total) by mouth 3 (three) times daily. (Patient not taking: Reported on 06/10/2020)  . [DISCONTINUED] naproxen (NAPROSYN) 500 MG tablet Take 1 tablet (500 mg total) by mouth 2 (two) times daily. Take with food (Patient not taking: Reported on 06/10/2020)  . [DISCONTINUED] traZODone (DESYREL) 50 MG tablet Take 1 tablet (50 mg total) by mouth at bedtime as needed for sleep. (Patient not taking: Reported on 06/10/2020)   No facility-administered encounter medications on file as of 06/10/2020.    Follow-up: Return in about 6 months (around 12/10/2020) for Annual physical.   Lindell Spar, MD

## 2020-06-10 NOTE — Patient Instructions (Signed)
Please start taking Trazodone as needed for sleep.  Please maintain simple sleep hygiene. - Maintain dark and non-noisy environment in the bedroom. - Please use the bedroom for sleep and sexual activity only. - Do not use electronic devices in the bedroom. - Please take dinner at least 2 hours before bedtime. - Please avoid caffeinated products in the evening, including coffee, soft drinks. - Please try to maintain the regular sleep-wake cycle - Go to bed and wake up at the same time.  Okay to take Tylenol up to 2 times in a day for pain/swelling.  Please get fasting blood tests done within a week.

## 2020-06-10 NOTE — Assessment & Plan Note (Signed)
MRI lumbar spine reviewed - disc protrusion and lateral recess stenosis at L3-L4 Has had Spine surgery evaluation in the past Tylenol PRN If persistent, will refer to pain clinic

## 2020-06-10 NOTE — Assessment & Plan Note (Signed)
Continue Claritin

## 2020-06-10 NOTE — Assessment & Plan Note (Signed)
Long-term history Has had Benzos in the past, previous PCP stopped it Continue Trazodone PRN

## 2020-06-15 DIAGNOSIS — Z7689 Persons encountering health services in other specified circumstances: Secondary | ICD-10-CM | POA: Diagnosis not present

## 2020-06-17 LAB — CMP14+EGFR
ALT: 19 IU/L (ref 0–44)
AST: 21 IU/L (ref 0–40)
Albumin/Globulin Ratio: 2 (ref 1.2–2.2)
Albumin: 4.5 g/dL (ref 3.8–4.9)
Alkaline Phosphatase: 115 IU/L (ref 44–121)
BUN/Creatinine Ratio: 10 (ref 9–20)
BUN: 12 mg/dL (ref 6–24)
Bilirubin Total: 0.3 mg/dL (ref 0.0–1.2)
CO2: 19 mmol/L — ABNORMAL LOW (ref 20–29)
Calcium: 9 mg/dL (ref 8.7–10.2)
Chloride: 104 mmol/L (ref 96–106)
Creatinine, Ser: 1.15 mg/dL (ref 0.76–1.27)
Globulin, Total: 2.2 g/dL (ref 1.5–4.5)
Glucose: 104 mg/dL — ABNORMAL HIGH (ref 65–99)
Potassium: 4 mmol/L (ref 3.5–5.2)
Sodium: 138 mmol/L (ref 134–144)
Total Protein: 6.7 g/dL (ref 6.0–8.5)
eGFR: 75 mL/min/{1.73_m2} (ref >59)

## 2020-06-17 LAB — PSA: Prostate Specific Ag, Serum: 2.8 ng/mL (ref 0.0–4.0)

## 2020-06-17 LAB — CBC WITH DIFFERENTIAL/PLATELET
Basophils Absolute: 0 10*3/uL (ref 0.0–0.2)
Basos: 1 %
EOS (ABSOLUTE): 0.1 10*3/uL (ref 0.0–0.4)
Eos: 1 %
Hematocrit: 41.3 % (ref 37.5–51.0)
Hemoglobin: 13.8 g/dL (ref 13.0–17.7)
Immature Grans (Abs): 0 10*3/uL (ref 0.0–0.1)
Immature Granulocytes: 0 %
Lymphocytes Absolute: 1.4 10*3/uL (ref 0.7–3.1)
Lymphs: 20 %
MCH: 30.1 pg (ref 26.6–33.0)
MCHC: 33.4 g/dL (ref 31.5–35.7)
MCV: 90 fL (ref 79–97)
Monocytes Absolute: 0.4 10*3/uL (ref 0.1–0.9)
Monocytes: 5 %
Neutrophils Absolute: 5.2 10*3/uL (ref 1.4–7.0)
Neutrophils: 73 %
Platelets: 216 10*3/uL (ref 150–450)
RBC: 4.58 x10E6/uL (ref 4.14–5.80)
RDW: 13.1 % (ref 11.6–15.4)
WBC: 7.2 10*3/uL (ref 3.4–10.8)

## 2020-06-17 LAB — STD SCREEN (6)
HCV Ab: 0.1 s/co ratio (ref 0.0–0.9)
HIV Screen 4th Generation wRfx: NONREACTIVE
HSV 1 Glycoprotein G Ab, IgG: 35.2 index — ABNORMAL HIGH (ref 0.00–0.90)
HSV 2 IgG, Type Spec: 1.27 index — ABNORMAL HIGH (ref 0.00–0.90)
Hepatitis B Surface Ag: NEGATIVE
RPR Ser Ql: NONREACTIVE

## 2020-06-17 LAB — LIPID PANEL
Chol/HDL Ratio: 3.7 ratio (ref 0.0–5.0)
Cholesterol, Total: 150 mg/dL (ref 100–199)
HDL: 41 mg/dL (ref >39)
LDL Chol Calc (NIH): 86 mg/dL (ref 0–99)
Triglycerides: 132 mg/dL (ref 0–149)
VLDL Cholesterol Cal: 23 mg/dL (ref 5–40)

## 2020-06-17 LAB — TSH: TSH: 2.28 u[IU]/mL (ref 0.450–4.500)

## 2020-06-17 LAB — HCV COMMENT:

## 2020-06-17 LAB — HSV-2 IGG SUPPLEMENTAL TEST: HSV-2 IgG Supplemental Test: NEGATIVE

## 2020-06-17 LAB — VITAMIN D 25 HYDROXY (VIT D DEFICIENCY, FRACTURES): Vit D, 25-Hydroxy: 27.3 ng/mL — ABNORMAL LOW (ref 30.0–100.0)

## 2020-06-22 ENCOUNTER — Encounter: Payer: Self-pay | Admitting: *Deleted

## 2020-08-12 ENCOUNTER — Other Ambulatory Visit: Payer: Self-pay

## 2020-08-12 ENCOUNTER — Encounter (HOSPITAL_COMMUNITY): Payer: Self-pay

## 2020-08-13 ENCOUNTER — Emergency Department (HOSPITAL_COMMUNITY)
Admission: EM | Admit: 2020-08-13 | Discharge: 2020-08-13 | Disposition: A | Discharge status: Home / Self Care | Payer: Medicare Other

## 2020-08-13 NOTE — ED Notes (Signed)
Pt called for triage, no answer

## 2020-08-22 ENCOUNTER — Other Ambulatory Visit: Payer: Self-pay

## 2020-08-22 ENCOUNTER — Emergency Department (HOSPITAL_COMMUNITY)
Admission: EM | Admit: 2020-08-22 | Discharge: 2020-08-22 | Disposition: A | Discharge status: Home / Self Care | Payer: Medicare Other | Attending: Emergency Medicine | Admitting: Emergency Medicine

## 2020-08-22 DIAGNOSIS — M79672 Pain in left foot: Secondary | ICD-10-CM | POA: Insufficient documentation

## 2020-08-22 DIAGNOSIS — Z5321 Procedure and treatment not carried out due to patient leaving prior to being seen by health care provider: Secondary | ICD-10-CM | POA: Diagnosis not present

## 2020-08-22 NOTE — ED Triage Notes (Signed)
Pt c/o left foot pain. Denies any recent injury. Pt states he believes its d/t wearing boots at work instead of tennis shoes.

## 2020-09-14 IMAGING — DX DG FINGER INDEX 2+V*L*
3 series · 3 of 3 positions shown · non-contrast
Comparison: None.

CLINICAL DATA: Patient reports foreign body. Sharp sensation in tip
of finger.

EXAM:
LEFT INDEX FINGER 2+V

[finger ap]
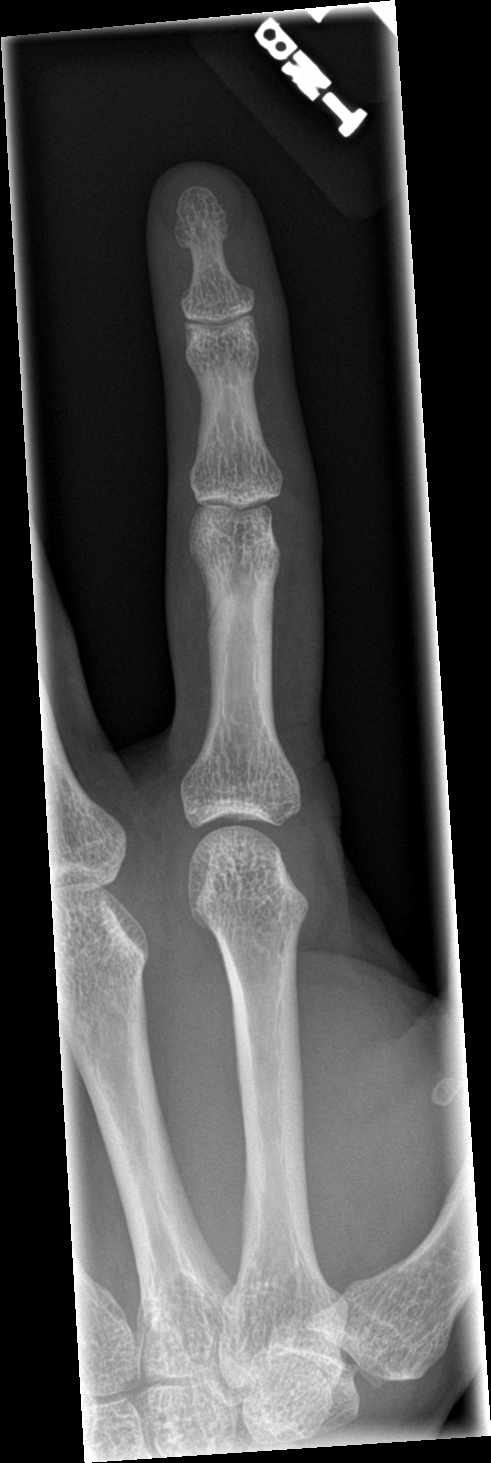

[finger obl]
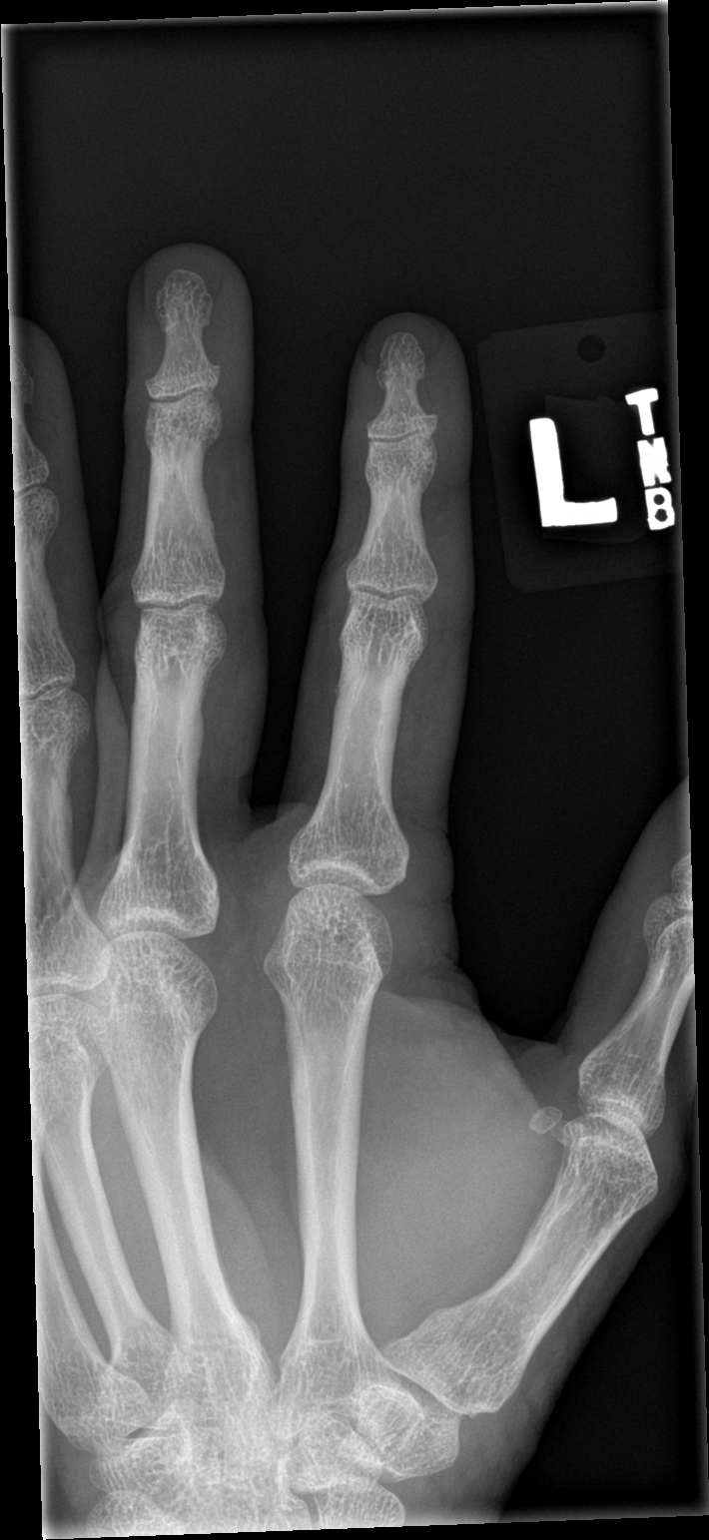

[finger lat]
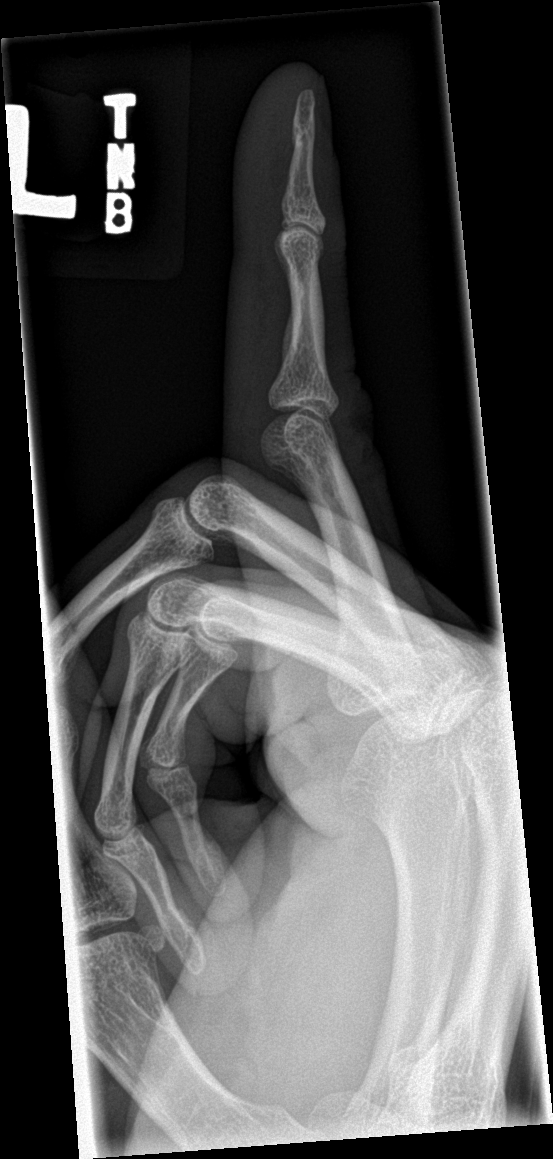

[3 of 3 positions shown; findings below may reference images not displayed]

FINDINGS: No fracture or bony abnormality identified. No radiopaque foreign
body noted. No bony erosion.
IMPRESSION: No radiopaque foreign body identified on this study.

## 2020-09-16 ENCOUNTER — Telehealth: Payer: Self-pay | Admitting: Internal Medicine

## 2020-09-16 NOTE — Telephone Encounter (Signed)
No answer unable to leave a message for patient to call back and schedule Medicare Annual Wellness Visit (AWV) in office.   If unable to come into the office for AWV,  please offer to do virtually or by telephone.  Last AWV: 04/29/2019  Please schedule at anytime with [place].  40 minute appointment  Any questions, please contact me at [phone]

## 2020-10-09 ENCOUNTER — Telehealth: Payer: Self-pay | Admitting: Internal Medicine

## 2020-10-09 NOTE — Telephone Encounter (Signed)
No answer unable to leave a message for patient to call back and schedule Medicare Annual Wellness Visit (AWV) in office.   If unable to come into the office for AWV,  please offer to do virtually or by telephone.  Last AWV: 04/29/2019  Please schedule at anytime with [place].  40 minute appointment  Any questions, please contact me at [phone]

## 2020-10-16 ENCOUNTER — Other Ambulatory Visit: Payer: Self-pay | Admitting: Internal Medicine

## 2020-10-16 DIAGNOSIS — F418 Other specified anxiety disorders: Secondary | ICD-10-CM

## 2020-10-21 ENCOUNTER — Other Ambulatory Visit: Payer: Self-pay

## 2020-10-21 ENCOUNTER — Ambulatory Visit (INDEPENDENT_AMBULATORY_CARE_PROVIDER_SITE_OTHER): Payer: Medicare Other | Admitting: Internal Medicine

## 2020-10-21 ENCOUNTER — Encounter: Payer: Self-pay | Admitting: Internal Medicine

## 2020-10-21 DIAGNOSIS — J0110 Acute frontal sinusitis, unspecified: Secondary | ICD-10-CM | POA: Diagnosis not present

## 2020-10-21 DIAGNOSIS — J302 Other seasonal allergic rhinitis: Secondary | ICD-10-CM | POA: Diagnosis not present

## 2020-10-21 MED ORDER — AMOXICILLIN-POT CLAVULANATE 875-125 MG PO TABS
1.0000 | ORAL_TABLET | Freq: Two times a day (BID) | ORAL | 0 refills | Status: DC
Start: 2020-10-21 — End: 2020-12-01

## 2020-10-21 MED ORDER — FLUTICASONE PROPIONATE 50 MCG/ACT NA SUSP
2.0000 | Freq: Every day | NASAL | 6 refills | Status: AC
Start: 2020-10-21 — End: ?

## 2020-10-21 NOTE — Progress Notes (Signed)
Virtual Visit via Telephone Note   This visit type was conducted due to national recommendations for restrictions regarding the COVID-19 Pandemic (e.g. social distancing) in an effort to limit this patient's exposure and mitigate transmission in our community.  Due to his co-morbid illnesses, this patient is at least at moderate risk for complications without adequate follow up.  This format is felt to be most appropriate for this patient at this time.  The patient did not have access to video technology/had technical difficulties with video requiring transitioning to audio format only (telephone).  All issues noted in this document were discussed and addressed.  No physical exam could be performed with this format.  Evaluation Performed:  Follow-up visit  Date:  10/21/2020   ID:  Jacob Reed, DOB 1965/09/04, MRN [id number]  Patient Location: Home Provider Location: Office/Clinic  Participants: Patient Location of Patient: Home Location of Provider: Telehealth Consent was obtain for visit to be over via telehealth. I verified that I am speaking with the correct person using two identifiers.  PCP:  Lindell Spar, MD   Chief Complaint: Nasal congestion, headache and cough  History of Present Illness:    Jacob Reed is a 55 y.o. male who has a televisit for complaint of cough, nasal congestion, sinus pressure and headache for last 10 days.  He has not had COVID test.  He denies any fever, chills, dyspnea or wheezing currently.  Of note, he takes Claritin for seasonal allergies.  The patient does not have symptoms concerning for COVID-19 infection (fever, chills, cough, or new shortness of breath).   Past Medical, Surgical, Social History, Allergies, and Medications have been Reviewed.  Past Medical History:  Diagnosis Date   Anxiety    Arthritis    Chronic neck and back pain    Dental caries    Sinusitis    Past Surgical History:  Procedure Laterality Date   INGUINAL  HERNIA REPAIR Right 10/01/2012   Procedure: HERNIA REPAIR INGUINAL ADULT;  Surgeon: Scherry Ran, MD;  Location: [facility];  Service: General;  Laterality: Right;   TOOTH EXTRACTION     VASECTOMY       Current Meds  Medication Sig   acetaminophen (TYLENOL) 500 MG tablet Take 500 mg by mouth daily as needed for moderate pain.   loratadine (CLARITIN) 10 MG tablet Take 10 mg by mouth daily.   traZODone (DESYREL) 50 MG tablet TAKE (1) TABLET BY MOUTH AT BEDTIME.   amoxicillin-clavulanate (AUGMENTIN) 875-125 MG tablet Take 1 tablet by mouth 2 (two) times daily.   fluticasone (FLONASE) 50 MCG/ACT nasal spray Place 2 sprays into both nostrils daily.     Allergies:   Tramadol   ROS:   Please see the history of present illness.     All other systems reviewed and are negative.   Labs/Other Tests and Data Reviewed:    Recent Labs: 06/15/2020: ALT 19; BUN 12; Creatinine, Ser 1.15; Hemoglobin 13.8; Platelets 216; Potassium 4.0; Sodium 138; TSH 2.280   Recent Lipid Panel Lab Results  Component Value Date/Time   CHOL 150 06/15/2020 09:21 AM   TRIG 132 06/15/2020 09:21 AM   HDL 41 06/15/2020 09:21 AM   CHOLHDL 3.7 06/15/2020 09:21 AM   CHOLHDL 3.8 04/29/2019 12:53 PM   LDLCALC 86 06/15/2020 09:21 AM   LDLCALC 92 04/29/2019 12:53 PM    Wt Readings from Last 3 Encounters:  06/10/20 193 lb (87.5 kg)  04/06/20 200 lb (90.7 kg)  07/17/19 200 lb (90.7 kg)     ASSESSMENT & PLAN:    Acute sinusitis Started Augmentin as he has persistent symptoms with symptomatic treatment Continue Robitussin or Mucinex for cough Nasal saline spray as needed for nasal congestion.  Seasonal allergies Continue Claritin Started Flonase  Time:   Today, I have spent 9 minutes reviewing the chart, including problem list, medications, and with the patient with telehealth technology discussing the above problems.   Medication Adjustments/Labs and Tests Ordered: Current medicines are reviewed at length  with the patient today.  Concerns regarding medicines are outlined above.   Tests Ordered: No orders of the defined types were placed in this encounter.   Medication Changes: Meds ordered this encounter  Medications   amoxicillin-clavulanate (AUGMENTIN) 875-125 MG tablet    Sig: Take 1 tablet by mouth 2 (two) times daily.    Dispense:  14 tablet    Refill:  0   fluticasone (FLONASE) 50 MCG/ACT nasal spray    Sig: Place 2 sprays into both nostrils daily.    Dispense:  16 g    Refill:  6     Note: This dictation was prepared with Dragon dictation along with smaller phrase technology. Similar sounding words can be transcribed inadequately or may not be corrected upon review. Any transcriptional errors that result from this process are unintentional.      Disposition:  Follow up  Signed, Lindell Spar, MD  10/21/2020 8:44 AM     [place]

## 2020-11-11 ENCOUNTER — Ambulatory Visit (INDEPENDENT_AMBULATORY_CARE_PROVIDER_SITE_OTHER): Payer: Medicare Other

## 2020-11-11 ENCOUNTER — Other Ambulatory Visit: Payer: Self-pay

## 2020-11-11 DIAGNOSIS — Z0000 Encounter for general adult medical examination without abnormal findings: Secondary | ICD-10-CM

## 2020-11-11 NOTE — Patient Instructions (Signed)
Jacob Reed , Thank you for taking time to come for your Medicare Wellness Visit. I appreciate your ongoing commitment to your health goals. Please review the following plan we discussed and let me know if I can assist you in the future.   Screening recommendations/referrals: Colonoscopy: due Recommended yearly ophthalmology/optometry visit for glaucoma screening and checkup Recommended yearly dental visit for hygiene and checkup  Vaccinations: Influenza vaccine: due Pneumococcal vaccine: due  Tdap vaccine: complete Shingles vaccine: due    Advanced directives: Patient declined     Next appointment: 1 year   Preventive Care 40-64 Years, Male Preventive care refers to lifestyle choices and visits with your health care provider that can promote health and wellness. What does preventive care include? A yearly physical exam. This is also called an annual well check. Dental exams once or twice a year. Routine eye exams. Ask your health care provider how often you should have your eyes checked. Personal lifestyle choices, including: Daily care of your teeth and gums. Regular physical activity. Eating a healthy diet. Avoiding tobacco and drug use. Limiting alcohol use. Practicing safe sex. Taking low-dose aspirin every day starting at age 66. What happens during an annual well check? The services and screenings done by your health care provider during your annual well check will depend on your age, overall health, lifestyle risk factors, and family history of disease. Counseling  Your health care provider may ask you questions about your: Alcohol use. Tobacco use. Drug use. Emotional well-being. Home and relationship well-being. Sexual activity. Eating habits. Work and work Statistician. Screening  You may have the following tests or measurements: Height, weight, and BMI. Blood pressure. Lipid and cholesterol levels. These may be checked every 5 years, or more frequently if  you are over 33 years old. Skin check. Lung cancer screening. You may have this screening every year starting at age 28 if you have a 30-pack-year history of smoking and currently smoke or have quit within the past 15 years. Fecal occult blood test (FOBT) of the stool. You may have this test every year starting at age 22. Flexible sigmoidoscopy or colonoscopy. You may have a sigmoidoscopy every 5 years or a colonoscopy every 10 years starting at age 54. Prostate cancer screening. Recommendations will vary depending on your family history and other risks. Hepatitis C blood test. Hepatitis B blood test. Sexually transmitted disease (STD) testing. Diabetes screening. This is done by checking your blood sugar (glucose) after you have not eaten for a while (fasting). You may have this done every 1-3 years. Discuss your test results, treatment options, and if necessary, the need for more tests with your health care provider. Vaccines  Your health care provider may recommend certain vaccines, such as: Influenza vaccine. This is recommended every year. Tetanus, diphtheria, and acellular pertussis (Tdap, Td) vaccine. You may need a Td booster every 10 years. Zoster vaccine. You may need this after age 81. Pneumococcal 13-valent conjugate (PCV13) vaccine. You may need this if you have certain conditions and have not been vaccinated. Pneumococcal polysaccharide (PPSV23) vaccine. You may need one or two doses if you smoke cigarettes or if you have certain conditions. Talk to your health care provider about which screenings and vaccines you need and how often you need them. This information is not intended to replace advice given to you by your health care provider. Make sure you discuss any questions you have with your health care provider. Document Released: 01/23/2015 Document Revised: 09/16/2015 Document Reviewed: 10/28/2014 Elsevier  Interactive Patient Education  2017 [place] Prevention  in the Home Falls can cause injuries. They can happen to people of all ages. There are many things you can do to make your home safe and to help prevent falls. What can I do on the outside of my home? Regularly fix the edges of walkways and driveways and fix any cracks. Remove anything that might make you trip as you walk through a door, such as a raised step or threshold. Trim any bushes or trees on the path to your home. Use bright outdoor lighting. Clear any walking paths of anything that might make someone trip, such as rocks or tools. Regularly check to see if handrails are loose or broken. Make sure that both sides of any steps have handrails. Any raised decks and porches should have guardrails on the edges. Have any leaves, snow, or ice cleared regularly. Use sand or salt on walking paths during winter. Clean up any spills in your garage right away. This includes oil or grease spills. What can I do in the bathroom? Use night lights. Install grab bars by the toilet and in the tub and shower. Do not use towel bars as grab bars. Use non-skid mats or decals in the tub or shower. If you need to sit down in the shower, use a plastic, non-slip stool. Keep the floor dry. Clean up any water that spills on the floor as soon as it happens. Remove soap buildup in the tub or shower regularly. Attach bath mats securely with double-sided non-slip rug tape. Do not have throw rugs and other things on the floor that can make you trip. What can I do in the bedroom? Use night lights. Make sure that you have a light by your bed that is easy to reach. Do not use any sheets or blankets that are too big for your bed. They should not hang down onto the floor. Have a firm chair that has side arms. You can use this for support while you get dressed. Do not have throw rugs and other things on the floor that can make you trip. What can I do in the kitchen? Clean up any spills right away. Avoid walking on wet  floors. Keep items that you use a lot in easy-to-reach places. If you need to reach something above you, use a strong step stool that has a grab bar. Keep electrical cords out of the way. Do not use floor polish or wax that makes floors slippery. If you must use wax, use non-skid floor wax. Do not have throw rugs and other things on the floor that can make you trip. What can I do with my stairs? Do not leave any items on the stairs. Make sure that there are handrails on both sides of the stairs and use them. Fix handrails that are broken or loose. Make sure that handrails are as long as the stairways. Check any carpeting to make sure that it is firmly attached to the stairs. Fix any carpet that is loose or worn. Avoid having throw rugs at the top or bottom of the stairs. If you do have throw rugs, attach them to the floor with carpet tape. Make sure that you have a light switch at the top of the stairs and the bottom of the stairs. If you do not have them, ask someone to add them for you. What else can I do to help prevent falls? Wear shoes that: Do not have high  heels. Have rubber bottoms. Are comfortable and fit you well. Are closed at the toe. Do not wear sandals. If you use a stepladder: Make sure that it is fully opened. Do not climb a closed stepladder. Make sure that both sides of the stepladder are locked into place. Ask someone to hold it for you, if possible. Clearly mark and make sure that you can see: Any grab bars or handrails. First and last steps. Where the edge of each step is. Use tools that help you move around (mobility aids) if they are needed. These include: Canes. Walkers. Scooters. Crutches. Turn on the lights when you go into a dark area. Replace any light bulbs as soon as they burn out. Set up your furniture so you have a clear path. Avoid moving your furniture around. If any of your floors are uneven, fix them. If there are any pets around you, be aware of  where they are. Review your medicines with your doctor. Some medicines can make you feel dizzy. This can increase your chance of falling. Ask your doctor what other things that you can do to help prevent falls. This information is not intended to replace advice given to you by your health care provider. Make sure you discuss any questions you have with your health care provider. Document Released: 10/23/2008 Document Revised: 06/04/2015 Document Reviewed: 01/31/2014 Elsevier Interactive Patient Education  2017 [employer].

## 2020-11-11 NOTE — Progress Notes (Signed)
Subjective:   Jacob Reed is a 55 y.o. male who presents for Medicare Annual/Subsequent preventive examination. I connected with  Wendie Simmer on 11/11/20 by a audio enabled telemedicine application and verified that I am speaking with the correct person using two identifiers.   I discussed the limitations, risks, security and privacy concerns of performing an evaluation and management service by telephone and the availability of in person appointments. I also discussed with the patient that there may be a patient responsible charge related to this service. The patient expressed understanding and verbally consented to this telephonic visit.   Review of Systems           Objective:    There were no vitals filed for this visit. There is no height or weight on file to calculate BMI.  Advanced Directives 07/17/2019 09/26/2018 06/22/2018 01/22/2018 12/25/2017 11/15/2017 09/24/2017  Does Patient Have a Medical Advance Directive? No No No No No No No  Would patient like information on creating a medical advance directive? - - No - Patient declined No - Patient declined No - Patient declined - -  Pre-existing out of facility DNR order (yellow form or pink MOST form) - - - - - - -    Current Medications (verified) Outpatient Encounter Medications as of 11/11/2020  Medication Sig   acetaminophen (TYLENOL) 500 MG tablet Take 500 mg by mouth daily as needed for moderate pain.   amoxicillin-clavulanate (AUGMENTIN) 875-125 MG tablet Take 1 tablet by mouth 2 (two) times daily.   fluticasone (FLONASE) 50 MCG/ACT nasal spray Place 2 sprays into both nostrils daily.   loratadine (CLARITIN) 10 MG tablet Take 10 mg by mouth daily.   traZODone (DESYREL) 50 MG tablet TAKE (1) TABLET BY MOUTH AT BEDTIME.   No facility-administered encounter medications on file as of 11/11/2020.    Allergies (verified) Tramadol   History: Past Medical History:  Diagnosis Date   Anxiety    Arthritis    Chronic neck  and back pain    Dental caries    Sinusitis    Past Surgical History:  Procedure Laterality Date   INGUINAL HERNIA REPAIR Right 10/01/2012   Procedure: HERNIA REPAIR INGUINAL ADULT;  Surgeon: Scherry Ran, MD;  Location: [facility];  Service: General;  Laterality: Right;   TOOTH EXTRACTION     VASECTOMY     Family History  Problem Relation Age of Onset   Arthritis Mother    Heart disease Father    Arthritis Father    Social History   Socioeconomic History   Marital status: Legally Separated    Spouse name: Not on file   Number of children: Not on file   Years of education: Not on file   Highest education level: Not on file  Occupational History   Not on file  Tobacco Use   Smoking status: Former   Smokeless tobacco: Current    Types: Snuff  Vaping Use   Vaping Use: Never used  Substance and Sexual Activity   Alcohol use: No   Drug use: No   Sexual activity: Yes  Other Topics Concern   Not on file  Social History Narrative   Not on file   Social Determinants of Health   Financial Resource Strain: Not on file  Food Insecurity: Not on file  Transportation Needs: Not on file  Physical Activity: Not on file  Stress: Not on file  Social Connections: Not on file    Tobacco Counseling Ready  to quit: Not Answered Counseling given: Not Answered   Clinical Intake:                 Diabetic?NO         Activities of Daily Living No flowsheet data found.  Patient Care Team: Lindell Spar, MD as PCP - General (Internal Medicine) Gala Romney Cristopher Estimable, MD as Consulting Physician (Gastroenterology)  Indicate any recent Medical Services you may have received from other than [facility] providers in the past year (date may be approximate).     Assessment:   This is a routine wellness examination for Shray.  Hearing/Vision screen No results found.  Dietary issues and exercise activities discussed:     Goals Addressed   None   Depression Screen PHQ  2/9 Scores 10/21/2020 06/10/2020 04/29/2019 06/27/2018 01/30/2018 12/06/2017 10/12/2017  PHQ - 2 Score 0 0 0 0 2 2 0  PHQ- 9 Score 0 - 5 - 9 9 -  Exception Documentation - - - - - - -    Fall Risk Fall Risk  10/21/2020 06/10/2020 04/29/2019 01/30/2018 12/06/2017  Falls in the past year? 0 0 0 0 0  Number falls in past yr: 0 0 - - -  Injury with Fall? 0 0 - - -  Risk for fall due to : No Fall Risks No Fall Risks No Fall Risks - -  Follow up Falls evaluation completed;Education provided;Falls prevention discussed Falls evaluation completed Falls evaluation completed Falls evaluation completed Falls evaluation completed    FALL RISK PREVENTION PERTAINING TO THE HOME:  Any stairs in or around the home? Yes  If so, are there any without handrails? Yes  Home free of loose throw rugs in walkways, pet beds, electrical cords, etc? Yes  Adequate lighting in your home to reduce risk of falls? Yes   ASSISTIVE DEVICES UTILIZED TO PREVENT FALLS:  Life alert? No  Use of a cane, walker or w/c? No  Grab bars in the bathroom? No  Shower chair or bench in shower? No  Elevated toilet seat or a handicapped toilet? No   TIMED UP AND GO:  Was the test performed? No .  Length of time to ambulate 10 feet: N/A sec.     Cognitive Function:        Immunizations Immunization History  Administered Date(s) Administered   Tdap 12/25/2017    TDAP status: Up to date  Flu Vaccine status: Due, Education has been provided regarding the importance of this vaccine. Advised may receive this vaccine at local pharmacy or Health Dept. Aware to provide a copy of the vaccination record if obtained from local pharmacy or Health Dept. Verbalized acceptance and understanding.  Pneumococcal vaccine status: Up to date  Covid-19 vaccine status: Declined, Education has been provided regarding the importance of this vaccine but patient still declined. Advised may receive this vaccine at local pharmacy or Health Dept.or  vaccine clinic. Aware to provide a copy of the vaccination record if obtained from local pharmacy or Health Dept. Verbalized acceptance and understanding.  Qualifies for Shingles Vaccine? Yes   Zostavax completed No   Shingrix Completed?: No.    Education has been provided regarding the importance of this vaccine. Patient has been advised to call insurance company to determine out of pocket expense if they have not yet received this vaccine. Advised may also receive vaccine at local pharmacy or Health Dept. Verbalized acceptance and understanding.  Screening Tests Health Maintenance  Topic Date Due   COVID-19 Vaccine (  1) Never done   Pneumococcal Vaccine 31-55 Years old (1 - PCV) Never done   Zoster Vaccines- Shingrix (1 of 2) Never done   COLONOSCOPY (Pts 45-50yrs Insurance coverage will need to be confirmed)  Never done   INFLUENZA VACCINE  Never done   TETANUS/TDAP  12/26/2027   Hepatitis C Screening  Completed   HIV Screening  Completed   HPV VACCINES  Aged Out    Health Maintenance  Health Maintenance Due  Topic Date Due   COVID-19 Vaccine (1) Never done   Pneumococcal Vaccine 10-75 Years old (1 - PCV) Never done   Zoster Vaccines- Shingrix (1 of 2) Never done   COLONOSCOPY (Pts 45-61yrs Insurance coverage will need to be confirmed)  Never done   INFLUENZA VACCINE  Never done    Colorectal cancer screening: No longer required.   Lung Cancer Screening: (Low Dose CT Chest recommended if Age 53-80 years, 30 pack-year currently smoking OR have quit w/in 15years.) does qualify.   Lung Cancer Screening Referral: NO  Additional Screening:  Hepatitis C Screening: does qualify; Completed 06/15/2020  Vision Screening: Recommended annual ophthalmology exams for early detection of glaucoma and other disorders of the eye. Is the patient up to date with their annual eye exam?  No  Who is the provider or what is the name of the office in which the patient attends annual eye exams?   If pt is not established with a provider, would they like to be referred to a provider to establish care? No .   Dental Screening: Recommended annual dental exams for proper oral hygiene  Community Resource Referral / Chronic Care Management: CRR required this visit?  No   CCM required this visit?  No      Plan:     I have personally reviewed and noted the following in the patient's chart:   Medical and social history Use of alcohol, tobacco or illicit drugs  Current medications and supplements including opioid prescriptions. Patient is not currently taking opioid prescriptions. Functional ability and status Nutritional status Physical activity Advanced directives List of other physicians Hospitalizations, surgeries, and ER visits in previous 12 months Vitals Screenings to include cognitive, depression, and falls Referrals and appointments  In addition, I have reviewed and discussed with patient certain preventive protocols, quality metrics, and best practice recommendations. A written personalized care plan for preventive services as well as general preventive health recommendations were provided to patient.     Quentin Angst, [place]   11/11/2020   Nurse Notes: This was a tele health visit with the patient at home. The provider was in the office and is Rutwik Patel,MD.

## 2020-12-01 ENCOUNTER — Encounter (INDEPENDENT_AMBULATORY_CARE_PROVIDER_SITE_OTHER): Payer: Self-pay

## 2020-12-01 ENCOUNTER — Other Ambulatory Visit: Payer: Self-pay

## 2020-12-01 ENCOUNTER — Encounter: Payer: Self-pay | Admitting: Internal Medicine

## 2020-12-01 ENCOUNTER — Ambulatory Visit (INDEPENDENT_AMBULATORY_CARE_PROVIDER_SITE_OTHER): Payer: Medicare Other | Admitting: Internal Medicine

## 2020-12-01 VITALS — BP 118/86 | HR 92 | Resp 18 | Ht 74.0 in | Wt 180.1 lb

## 2020-12-01 DIAGNOSIS — Z0001 Encounter for general adult medical examination with abnormal findings: Secondary | ICD-10-CM

## 2020-12-01 DIAGNOSIS — Z1211 Encounter for screening for malignant neoplasm of colon: Secondary | ICD-10-CM | POA: Diagnosis not present

## 2020-12-01 DIAGNOSIS — E559 Vitamin D deficiency, unspecified: Secondary | ICD-10-CM | POA: Diagnosis not present

## 2020-12-01 DIAGNOSIS — Z125 Encounter for screening for malignant neoplasm of prostate: Secondary | ICD-10-CM

## 2020-12-01 DIAGNOSIS — F5101 Primary insomnia: Secondary | ICD-10-CM

## 2020-12-01 DIAGNOSIS — Z2821 Immunization not carried out because of patient refusal: Secondary | ICD-10-CM

## 2020-12-01 DIAGNOSIS — F418 Other specified anxiety disorders: Secondary | ICD-10-CM

## 2020-12-01 MED ORDER — TRAZODONE HCL 50 MG PO TABS: ORAL_TABLET | ORAL | 1 refills | Status: DC

## 2020-12-01 NOTE — Patient Instructions (Signed)
Please continue taking Trazodone as prescribed.  Please maintain simple sleep hygiene. - Maintain dark and non-noisy environment in the bedroom. - Please use the bedroom for sleep and sexual activity only. - Do not use electronic devices in the bedroom. - Please take dinner at least 2 hours before bedtime. - Please avoid caffeinated products in the evening, including coffee, soft drinks. - Please try to maintain the regular sleep-wake cycle - Go to bed and wake up at the same time.  Please get fasting blood tests done before the next visit.

## 2020-12-03 DIAGNOSIS — Z125 Encounter for screening for malignant neoplasm of prostate: Secondary | ICD-10-CM | POA: Insufficient documentation

## 2020-12-03 NOTE — Assessment & Plan Note (Signed)
Needs to take trazodone as prescribed

## 2020-12-03 NOTE — Assessment & Plan Note (Signed)
Uncontrolled due to noncompliance Advised to take trazodone as prescribed Would avoid starting BZDs previous history of inadvertent use according to chart review.

## 2020-12-03 NOTE — Assessment & Plan Note (Signed)
Ordered PSA after discussing its limitations for prostate cancer screening, including false positive results leading additional investigations. 

## 2020-12-03 NOTE — Assessment & Plan Note (Addendum)
Physical exam as documented. Counseling done  re healthy lifestyle involving commitment to 150 minutes exercise per week, heart healthy diet, and attaining healthy weight.The importance of adequate sleep also discussed. Changes in health habits are decided on by the patient with goals and time frames  set for achieving them. Immunization and cancer screening needs are specifically addressed at this visit.  Ordered Cologuard for colon cancer screening. Denies any vaccines.

## 2020-12-03 NOTE — Progress Notes (Signed)
Established Patient Office Visit  Subjective:  Patient ID: Jacob Reed, male    DOB: 11/04/1965  Age: 55 y.o. MRN: [id number]  CC:  Chief Complaint  Patient presents with   Annual Exam    Annual exam has spot on right knuckle would liked looked at    HPI Jacob Reed is a 55 y.o. male with past medical history of diffuse OA, DDD of lumbar spine and depression with anxiety who presents for annual physical.  He reports having a spot on his right hand, which is itching/irritable.  He denies any recent injury.  It seems like he has been scratching the area chronically, which has caused local irritation.  Denies any insect bite.  He has h/o chronic low back pain, for which he was evaluated by Spine surgery in the past. He states that he was told that he is not a surgical candidate as he had arthritis in addition to spinal stenosis. Denies any numbness or tingling in the LE. He asks whether he can get Valium for back pain. Chart review suggests that he did not use Valium as instructed in the past.  He denies colonoscopy.  He agreed for cologuard for now.  He denies taking any vaccine.    Past Medical History:  Diagnosis Date   Anxiety    Arthritis    Chronic neck and back pain    Dental caries    Sinusitis     Past Surgical History:  Procedure Laterality Date   INGUINAL HERNIA REPAIR Right 10/01/2012   Procedure: HERNIA REPAIR INGUINAL ADULT;  Surgeon: Scherry Ran, MD;  Location: [facility];  Service: General;  Laterality: Right;   TOOTH EXTRACTION     VASECTOMY      Family History  Problem Relation Age of Onset   Arthritis Mother    Heart disease Father    Arthritis Father     Social History   Socioeconomic History   Marital status: Legally Separated    Spouse name: Not on file   Number of children: Not on file   Years of education: Not on file   Highest education level: Not on file  Occupational History   Not on file  Tobacco Use   Smoking status: Former    Smokeless tobacco: Current    Types: Snuff  Vaping Use   Vaping Use: Never used  Substance and Sexual Activity   Alcohol use: No   Drug use: No   Sexual activity: Yes  Other Topics Concern   Not on file  Social History Narrative   Not on file   Social Determinants of Health   Financial Resource Strain: Low Risk    Difficulty of Paying Living Expenses: Not hard at all  Food Insecurity: No Food Insecurity   Worried About Charity fundraiser in the Last Year: Never true   Arboriculturist in the Last Year: Never true  Transportation Needs: Unmet Transportation Needs   Lack of Transportation (Medical): Yes   Lack of Transportation (Non-Medical): Yes  Physical Activity: Sufficiently Active   Days of Exercise per Week: 5 days   Minutes of Exercise per Session: 30 min  Stress: No Stress Concern Present   Feeling of Stress : Not at all  Social Connections: Socially Integrated   Frequency of Communication with Friends and Family: Three times a week   Frequency of Social Gatherings with Friends and Family: Three times a week   Attends Religious Services: 1  to 4 times per year   Active Member of Clubs or Organizations: Yes   Attends Archivist Meetings: 1 to 4 times per year   Marital Status: Married  Human resources officer Violence: Not At Risk   Fear of Current or Ex-Partner: No   Emotionally Abused: No   Physically Abused: No   Sexually Abused: No    Outpatient Medications Prior to Visit  Medication Sig Dispense Refill   acetaminophen (TYLENOL) 500 MG tablet Take 500 mg by mouth daily as needed for moderate pain.     fluticasone (FLONASE) 50 MCG/ACT nasal spray Place 2 sprays into both nostrils daily. 16 g 6   loratadine (CLARITIN) 10 MG tablet Take 10 mg by mouth daily.     traZODone (DESYREL) 50 MG tablet TAKE (1) TABLET BY MOUTH AT BEDTIME. 30 tablet 0   amoxicillin-clavulanate (AUGMENTIN) 875-125 MG tablet Take 1 tablet by mouth 2 (two) times daily. (Patient not  taking: Reported on 12/01/2020) 14 tablet 0   No facility-administered medications prior to visit.    Allergies  Allergen Reactions   Tramadol Hives    ROS Review of Systems  Constitutional:  Negative for chills and fever.  HENT:  Negative for congestion and sore throat.   Eyes:  Negative for pain and discharge.  Respiratory:  Negative for cough and shortness of breath.   Cardiovascular:  Negative for chest pain and palpitations.  Gastrointestinal:  Negative for constipation, diarrhea, nausea and vomiting.  Endocrine: Negative for polydipsia and polyuria.  Genitourinary:  Negative for dysuria and hematuria.  Musculoskeletal:  Positive for arthralgias and back pain. Negative for neck pain and neck stiffness.  Skin:        Right knuckle lesion  Neurological:  Negative for dizziness, weakness, numbness and headaches.  Psychiatric/Behavioral:  Positive for sleep disturbance. Negative for agitation and behavioral problems. The patient is nervous/anxious.      Objective:    Physical Exam Vitals reviewed.  Constitutional:      General: He is not in acute distress.    Appearance: He is not diaphoretic.  HENT:     Head: Normocephalic and atraumatic.     Nose: Nose normal.     Mouth/Throat:     Mouth: Mucous membranes are moist.  Eyes:     General: No scleral icterus.    Extraocular Movements: Extraocular movements intact.  Cardiovascular:     Rate and Rhythm: Normal rate and regular rhythm.     Pulses: Normal pulses.     Heart sounds: Normal heart sounds. No murmur heard. Pulmonary:     Breath sounds: Normal breath sounds. No wheezing or rales.  Abdominal:     Palpations: Abdomen is soft.     Tenderness: There is no abdominal tenderness.  Musculoskeletal:     Cervical back: Neck supple. No tenderness.     Right lower leg: No edema.     Left lower leg: No edema.  Skin:    General: Skin is warm.     Comments: Keratotic lesion over right hand  Neurological:     General:  No focal deficit present.     Mental Status: He is alert and oriented to person, place, and time.     Cranial Nerves: No cranial nerve deficit.     Sensory: No sensory deficit.     Motor: No weakness.  Psychiatric:        Mood and Affect: Mood normal.        Behavior: Behavior normal.  BP 118/86 (BP Location: Left Arm, Patient Position: Sitting, Cuff Size: Normal)   Pulse 92   Resp 18   Ht '6\' 2"'  (1.88 m)   Wt 180 lb 1.3 oz (81.7 kg)   SpO2 98%   BMI 23.12 kg/m  Wt Readings from Last 3 Encounters:  12/01/20 180 lb 1.3 oz (81.7 kg)  06/10/20 193 lb (87.5 kg)  04/06/20 200 lb (90.7 kg)    Lab Results  Component Value Date   TSH 2.280 06/15/2020   Lab Results  Component Value Date   WBC 7.2 06/15/2020   HGB 13.8 06/15/2020   HCT 41.3 06/15/2020   MCV 90 06/15/2020   PLT 216 06/15/2020   Lab Results  Component Value Date   NA 138 06/15/2020   K 4.0 06/15/2020   CO2 19 (L) 06/15/2020   GLUCOSE 104 (H) 06/15/2020   BUN 12 06/15/2020   CREATININE 1.15 06/15/2020   BILITOT 0.3 06/15/2020   ALKPHOS 115 06/15/2020   AST 21 06/15/2020   ALT 19 06/15/2020   PROT 6.7 06/15/2020   ALBUMIN 4.5 06/15/2020   CALCIUM 9.0 06/15/2020   ANIONGAP 8 11/02/2016   EGFR 75 06/15/2020   Lab Results  Component Value Date   CHOL 150 06/15/2020   Lab Results  Component Value Date   HDL 41 06/15/2020   Lab Results  Component Value Date   LDLCALC 86 06/15/2020   Lab Results  Component Value Date   TRIG 132 06/15/2020   Lab Results  Component Value Date   CHOLHDL 3.7 06/15/2020   Lab Results  Component Value Date   HGBA1C 5.6 10/29/2018      Assessment & Plan:   Problem List Items Addressed This Visit       Other   Depression with anxiety    Uncontrolled due to noncompliance Advised to take trazodone as prescribed Would avoid starting BZDs previous history of inadvertent use according to chart review.      Relevant Medications   traZODone (DESYREL) 50 MG  tablet   Encounter for general adult medical examination with abnormal findings - Primary    Physical exam as documented. Counseling done  re healthy lifestyle involving commitment to 150 minutes exercise per week, heart healthy diet, and attaining healthy weight.The importance of adequate sleep also discussed. Changes in health habits are decided on by the patient with goals and time frames  set for achieving them. Immunization and cancer screening needs are specifically addressed at this visit.  Ordered Cologuard for colon cancer screening. Denies any vaccines.      Relevant Orders   Urinalysis   TSH   Hemoglobin A1c   CMP14+EGFR   CBC with Differential/Platelet   Lipid Profile   Insomnia    Needs to take trazodone as prescribed      Prostate cancer screening    Ordered PSA after discussing its limitations for prostate cancer screening, including false positive results leading additional investigations.      Relevant Orders   PSA   Other Visit Diagnoses     Refused influenza vaccine       Screening for colon cancer       Relevant Orders   Cologuard   Vitamin D deficiency       Relevant Orders   VITAMIN D 25 Hydroxy (Vit-D Deficiency, Fractures)       Meds ordered this encounter  Medications   traZODone (DESYREL) 50 MG tablet    Sig: TAKE (1) TABLET BY  MOUTH AT BEDTIME.    Dispense:  90 tablet    Refill:  1    Follow-up: Return in about 6 months (around 05/31/2021) for Anxiety.    Lindell Spar, MD

## 2020-12-10 ENCOUNTER — Encounter: Payer: Medicare Other | Admitting: Internal Medicine

## 2021-01-27 DIAGNOSIS — Z0001 Encounter for general adult medical examination with abnormal findings: Secondary | ICD-10-CM | POA: Diagnosis not present

## 2021-01-27 DIAGNOSIS — E559 Vitamin D deficiency, unspecified: Secondary | ICD-10-CM | POA: Diagnosis not present

## 2021-01-29 LAB — CBC WITH DIFFERENTIAL/PLATELET
Basophils Absolute: 0.1 10*3/uL (ref 0.0–0.2)
Basos: 1 %
EOS (ABSOLUTE): 0.1 10*3/uL (ref 0.0–0.4)
Eos: 1 %
Hematocrit: 45.1 % (ref 37.5–51.0)
Hemoglobin: 15.1 g/dL (ref 13.0–17.7)
Immature Grans (Abs): 0 10*3/uL (ref 0.0–0.1)
Immature Granulocytes: 0 %
Lymphocytes Absolute: 1.6 10*3/uL (ref 0.7–3.1)
Lymphs: 19 %
MCH: 30.3 pg (ref 26.6–33.0)
MCHC: 33.5 g/dL (ref 31.5–35.7)
MCV: 90 fL (ref 79–97)
Monocytes Absolute: 0.6 10*3/uL (ref 0.1–0.9)
Monocytes: 7 %
Neutrophils Absolute: 6 10*3/uL (ref 1.4–7.0)
Neutrophils: 72 %
Platelets: 211 10*3/uL (ref 150–450)
RBC: 4.99 x10E6/uL (ref 4.14–5.80)
RDW: 12.8 % (ref 11.6–15.4)
WBC: 8.4 10*3/uL (ref 3.4–10.8)

## 2021-01-29 LAB — LIPID PANEL
Chol/HDL Ratio: 3.4 ratio (ref 0.0–5.0)
Cholesterol, Total: 139 mg/dL (ref 100–199)
HDL: 41 mg/dL (ref >39)
LDL Chol Calc (NIH): 46 mg/dL (ref 0–99)
Triglycerides: 347 mg/dL — ABNORMAL HIGH (ref 0–149)
VLDL Cholesterol Cal: 52 mg/dL — ABNORMAL HIGH (ref 5–40)

## 2021-01-29 LAB — URINALYSIS
Bilirubin, UA: NEGATIVE
Glucose, UA: NEGATIVE
Ketones, UA: NEGATIVE
Leukocytes,UA: NEGATIVE
Nitrite, UA: NEGATIVE
RBC, UA: NEGATIVE
Specific Gravity, UA: 1.022 (ref 1.005–1.030)
Urobilinogen, Ur: 1 mg/dL (ref 0.2–1.0)
pH, UA: 8 — ABNORMAL HIGH (ref 5.0–7.5)

## 2021-01-29 LAB — CMP14+EGFR
ALT: 16 IU/L (ref 0–44)
AST: 24 IU/L (ref 0–40)
Albumin/Globulin Ratio: 1.8 (ref 1.2–2.2)
Albumin: 4.4 g/dL (ref 3.8–4.9)
Alkaline Phosphatase: 117 IU/L (ref 44–121)
BUN/Creatinine Ratio: 12 (ref 9–20)
BUN: 13 mg/dL (ref 6–24)
Bilirubin Total: 0.4 mg/dL (ref 0.0–1.2)
CO2: 21 mmol/L (ref 20–29)
Calcium: 8.8 mg/dL (ref 8.7–10.2)
Chloride: 101 mmol/L (ref 96–106)
Creatinine, Ser: 1.1 mg/dL (ref 0.76–1.27)
Globulin, Total: 2.5 g/dL (ref 1.5–4.5)
Glucose: 96 mg/dL (ref 70–99)
Potassium: 5.1 mmol/L (ref 3.5–5.2)
Sodium: 139 mmol/L (ref 134–144)
Total Protein: 6.9 g/dL (ref 6.0–8.5)
eGFR: 79 mL/min/{1.73_m2} (ref >59)

## 2021-01-29 LAB — TSH: TSH: 0.695 u[IU]/mL (ref 0.450–4.500)

## 2021-01-29 LAB — PSA: Prostate Specific Ag, Serum: 3.1 ng/mL (ref 0.0–4.0)

## 2021-01-29 LAB — VITAMIN D 25 HYDROXY (VIT D DEFICIENCY, FRACTURES): Vit D, 25-Hydroxy: 21.6 ng/mL — ABNORMAL LOW (ref 30.0–100.0)

## 2021-01-29 LAB — HEMOGLOBIN A1C
Est. average glucose Bld gHb Est-mCnc: 123 mg/dL
Hgb A1c MFr Bld: 5.9 % — ABNORMAL HIGH (ref 4.8–5.6)

## 2021-02-11 ENCOUNTER — Telehealth: Payer: Self-pay

## 2021-02-11 NOTE — Telephone Encounter (Signed)
Patient informed. 

## 2021-02-11 NOTE — Telephone Encounter (Signed)
Patient came by the office needs sleep medicine but patient does not know the name of it.    Pharmacy:  [facility]

## 2021-02-11 NOTE — Telephone Encounter (Signed)
Please have pt call the pharmacy regarding medication

## 2021-02-11 NOTE — Telephone Encounter (Signed)
Please have pt call the pharmacy regarding refills as they are there

## 2021-02-15 ENCOUNTER — Other Ambulatory Visit: Payer: Self-pay | Admitting: Internal Medicine

## 2021-02-15 DIAGNOSIS — F418 Other specified anxiety disorders: Secondary | ICD-10-CM

## 2021-02-15 MED ORDER — TRAZODONE HCL 50 MG PO TABS: ORAL_TABLET | ORAL | 1 refills | Status: AC

## 2021-02-15 NOTE — Telephone Encounter (Signed)
Patient came by the office said [facility] has not yet received this medicine traZODone (DESYREL) 50 MG tablet

## 2021-02-16 NOTE — Telephone Encounter (Signed)
Called patient no answer, he walks in the office again saying somebody been trying to call my phone.  I contacted [employer] and Hetty Blend said his insurance will not pay for it until 02.17.2023. Patient informed.

## 2021-02-16 NOTE — Telephone Encounter (Signed)
Please advise pt to check with pharmacy new prescription sent per patel

## 2021-02-16 NOTE — Telephone Encounter (Signed)
noted 

## 2021-02-26 ENCOUNTER — Telehealth: Payer: Self-pay

## 2021-02-26 NOTE — Telephone Encounter (Signed)
Patient came by the office and has some disability forms for work only needs the Are (able to work check one box and sign it) , wants to work part time. Does he need an appt for this?

## 2021-02-26 NOTE — Telephone Encounter (Signed)
Contacted patient, will bring form back by when its not rainy.

## 2021-02-26 NOTE — Telephone Encounter (Signed)
Please put in provider box and we will decide from there

## 2021-03-15 NOTE — Congregational Nurse Program (Signed)
?  Dept: [phone] ? ? ?Congregational Nurse Program Note ? ?Date of Encounter: 03/15/2021 ? ?Past Medical History: ?Past Medical History:  ?Diagnosis Date  ? Anxiety   ? Arthritis   ? Chronic neck and back pain   ? Dental caries   ? Sinusitis   ? ? ?Encounter Details: ? ?Encountered at [facility]. ?Requested blood pressure check. On no medications for blood pressure, no history of DM. ?Verbal permission given today for a blood pressure screening. ?Blood pressure 147/91 left arm, sitting regular cuff , pulse 88. ?Discussed ways to help lower blood pressure such as decreasing salt intake, if smoker to decrease or quit smoking. ?Encouraged him to follow up as directed with is primary care provider.  ? ?Debria Garret RN ?Congregational Nurse/Community Nursing program ? ?

## 2021-04-23 ENCOUNTER — Ambulatory Visit (INDEPENDENT_AMBULATORY_CARE_PROVIDER_SITE_OTHER): Payer: Medicare Other | Admitting: Family Medicine

## 2021-04-23 ENCOUNTER — Encounter: Payer: Self-pay | Admitting: Family Medicine

## 2021-04-23 VITALS — BP 118/68 | HR 60 | Ht 74.0 in | Wt 178.8 lb

## 2021-04-23 DIAGNOSIS — L989 Disorder of the skin and subcutaneous tissue, unspecified: Secondary | ICD-10-CM

## 2021-04-23 DIAGNOSIS — C44311 Basal cell carcinoma of skin of nose: Secondary | ICD-10-CM

## 2021-04-23 NOTE — Assessment & Plan Note (Signed)
-  Referral to dermatology for dermoscopy and biopsy if indicated ?

## 2021-04-23 NOTE — Progress Notes (Signed)
? ?Established Patient Office Visit ? ?Subjective:  ?Patient ID: Jacob Reed, male    DOB: 1965-11-02  Age: 56 y.o. MRN: [id number] ? ?CC:  ?Chief Complaint  ?Patient presents with  ? Skin Problem  ?  Has a spot on his nose is concerned it can be skin cancer.   ? ? ?HPI ?Jacob Reed is a 56 y.o. male with past medical history of degenerative disc disease, osteoarthritis, depression with anxiety, and inosmnia presents with c/o pf a lesion on the left alra groove. 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 His mother had breast cancer, and his grandmother had lung cancer. The patient is concerned about the growth on his nostril and would like to be evaluated to see if it is cancerous.  ? ?Onset: a year ago ?Location: left alra grove of the nostril ?Asymmetry: well-defined papule ?Boarder: rolled borders ?Characteristics:painless flesh-colored translucent pearly appearance  ?Diameter:0.5 cm ?Evolution: unchanged since onset ? ? ? ? ?Past Medical History:  ?Diagnosis Date  ? Anxiety   ? Arthritis   ? Chronic neck and back pain   ? Dental caries   ? Sinusitis   ? ? ?Past Surgical History:  ?Procedure Laterality Date  ? INGUINAL HERNIA REPAIR Right 10/01/2012  ? Procedure: HERNIA REPAIR INGUINAL ADULT;  Surgeon: Scherry Ran, MD;  Location: [facility];  Service: General;  Laterality: Right;  ? TOOTH EXTRACTION    ? VASECTOMY    ? ? ?Family History  ?Problem Relation Age of Onset  ? Arthritis Mother   ? Heart disease Father   ? Arthritis Father   ? ? ?Social History  ? ?Socioeconomic History  ? Marital status: Legally Separated  ?  Spouse name: Not on file  ? Number of children: Not on file  ? Years of education: Not on file  ? Highest education level: Not on file  ?Occupational History  ? Not on file  ?Tobacco Use  ? Smoking status: Former  ? Smokeless tobacco: Current  ?  Types: Snuff  ?Vaping Use  ? Vaping Use: Never used  ?Substance and Sexual Activity  ? Alcohol use: No  ? Drug use:  No  ? Sexual activity: Yes  ?Other Topics Concern  ? Not on file  ?Social History Narrative  ? Not on file  ? ?Social Determinants of Health  ? ?Financial Resource Strain: Low Risk   ? Difficulty of Paying Living Expenses: Not hard at all  ?Food Insecurity: No Food Insecurity  ? Worried About Charity fundraiser in the Last Year: Never true  ? Ran Out of Food in the Last Year: Never true  ?Transportation Needs: Unmet Transportation Needs  ? Lack of Transportation (Medical): Yes  ? Lack of Transportation (Non-Medical): Yes  ?Physical Activity: Sufficiently Active  ? Days of Exercise per Week: 5 days  ? Minutes of Exercise per Session: 30 min  ?Stress: No Stress Concern Present  ? Feeling of Stress : Not at all  ?Social Connections: Socially Integrated  ? Frequency of Communication with Friends and Family: Three times a week  ? Frequency of Social Gatherings with Friends and Family: Three times a week  ? Attends Religious Services: 1 to 4 times per year  ? Active Member of Clubs or Organizations: Yes  ? Attends Archivist Meetings: 1 to 4 times per year  ? Marital Status: Married  ?Intimate Partner Violence: Not At Risk  ? Fear of Current  or Ex-Partner: No  ? Emotionally Abused: No  ? Physically Abused: No  ? Sexually Abused: No  ? ? ?Outpatient Medications Prior to Visit  ?Medication Sig Dispense Refill  ? acetaminophen (TYLENOL) 500 MG tablet Take 500 mg by mouth daily as needed for moderate pain.    ? loratadine (CLARITIN) 10 MG tablet Take 10 mg by mouth daily.    ? traZODone (DESYREL) 50 MG tablet TAKE (1) TABLET BY MOUTH AT BEDTIME. 90 tablet 1  ? fluticasone (FLONASE) 50 MCG/ACT nasal spray Place 2 sprays into both nostrils daily. (Patient not taking: Reported on 04/23/2021) 16 g 6  ? ?No facility-administered medications prior to visit.  ? ? ?Allergies  ?Allergen Reactions  ? Tramadol Hives  ? ? ?ROS ?Review of Systems  ?Constitutional:  Negative for chills, fatigue and fever.  ?HENT:  Negative for  sinus pressure, sinus pain and sore throat.   ?Cardiovascular:  Negative for chest pain and palpitations.  ?Gastrointestinal:  Negative for constipation, diarrhea, nausea and vomiting.  ?Endocrine: Negative for polydipsia, polyphagia and polyuria.  ?Genitourinary:  Negative for hematuria, penile swelling and urgency.  ?Musculoskeletal:  Negative for back pain and neck pain.  ?Skin:  Negative for color change.  ?     Growth on the left nose  ?Neurological:  Negative for dizziness, seizures and numbness.  ?Psychiatric/Behavioral:  Negative for self-injury and suicidal ideas.   ? ?  ?Objective:  ?  ?Physical Exam ?Constitutional:   ?   Appearance: Normal appearance.  ?HENT:  ?   Head: Normocephalic.  ?   Right Ear: External ear normal.  ?   Left Ear: External ear normal.  ?   Nose:  ?   Left Nostril: No foreign body, epistaxis, septal hematoma or occlusion.  ?   Comments: Lesion appears as a well-defined papule that is firm and painless, with a flesh-colored, translucent, pearly appearance and a rolled border. ?   Mouth/Throat:  ?   Mouth: Mucous membranes are moist.  ?Cardiovascular:  ?   Rate and Rhythm: Normal rate and regular rhythm.  ?   Pulses: Normal pulses.  ?   Heart sounds: Normal heart sounds.  ?Pulmonary:  ?   Effort: Pulmonary effort is normal.  ?   Breath sounds: Normal breath sounds.  ?Musculoskeletal:  ?   Cervical back: Normal range of motion. No rigidity or tenderness.  ?Skin: ?   General: Skin is warm.  ?   Findings: Lesion present.  ?Neurological:  ?   Mental Status: He is alert.  ?Psychiatric:  ?   Comments: Normal affect  ? ? ?BP 118/68 (BP Location: Right Arm, Patient Position: Sitting)   Pulse 60   Ht '6\' 2"'  (1.88 m)   Wt 178 lb 12.8 oz (81.1 kg)   SpO2 98%   BMI 22.96 kg/m?  ?Wt Readings from Last 3 Encounters:  ?04/23/21 178 lb 12.8 oz (81.1 kg)  ?12/01/20 180 lb 1.3 oz (81.7 kg)  ?06/10/20 193 lb (87.5 kg)  ? ? ?Lab Results  ?Component Value Date  ? TSH 0.695 01/27/2021  ? ?Lab Results   ?Component Value Date  ? WBC 8.4 01/27/2021  ? HGB 15.1 01/27/2021  ? HCT 45.1 01/27/2021  ? MCV 90 01/27/2021  ? PLT 211 01/27/2021  ? ?Lab Results  ?Component Value Date  ? NA 139 01/27/2021  ? K 5.1 01/27/2021  ? CO2 21 01/27/2021  ? GLUCOSE 96 01/27/2021  ? BUN 13 01/27/2021  ? CREATININE 1.10  01/27/2021  ? BILITOT 0.4 01/27/2021  ? ALKPHOS 117 01/27/2021  ? AST 24 01/27/2021  ? ALT 16 01/27/2021  ? PROT 6.9 01/27/2021  ? ALBUMIN 4.4 01/27/2021  ? CALCIUM 8.8 01/27/2021  ? ANIONGAP 8 11/02/2016  ? EGFR 79 01/27/2021  ? ?Lab Results  ?Component Value Date  ? CHOL 139 01/27/2021  ? ?Lab Results  ?Component Value Date  ? HDL 41 01/27/2021  ? ?Lab Results  ?Component Value Date  ? LDLCALC 46 01/27/2021  ? ?Lab Results  ?Component Value Date  ? TRIG 347 (H) 01/27/2021  ? ?Lab Results  ?Component Value Date  ? CHOLHDL 3.4 01/27/2021  ? ?Lab Results  ?Component Value Date  ? HGBA1C 5.9 (H) 01/27/2021  ? ? ?  ?Assessment & Plan:  ? ?Problem List Items Addressed This Visit   ? ?  ? Musculoskeletal and Integument  ? Basal cell carcinoma (BCC) of ala nasi - Primary  ?  -Referral to dermatology for dermoscopy and biopsy if indicated ?  ?  ? Relevant Orders  ? Ambulatory referral to Dermatology  ? ?Other Visit Diagnoses   ? ? Lesion of ala of nose      ? ?  ? ? ?No orders of the defined types were placed in this encounter. ? ? ?Follow-up: No follow-ups on file.  ? ? ?Alvira Monday, FNP ?

## 2021-04-23 NOTE — Patient Instructions (Signed)
I appreciate the opportunity to provide care to you today! ?  ?Referral made to dermatology today ? ? ?You will be called with your appts ?  ? ? ?  ?It was a pleasure to see you and I look forward to continuing to work together on your health and well-being. ?Please do not hesitate to call the office if you need care or have questions about your care. ?  ?Have a wonderful day and week. ?With Gratitude, ?Alvira Monday MSN, FNP-BC  ?

## 2021-04-30 ENCOUNTER — Ambulatory Visit: Payer: Medicare Other | Admitting: Internal Medicine

## 2021-05-02 ENCOUNTER — Encounter (HOSPITAL_COMMUNITY): Payer: Self-pay | Admitting: Emergency Medicine

## 2021-05-02 ENCOUNTER — Emergency Department (HOSPITAL_COMMUNITY)
Admission: EM | Admit: 2021-05-02 | Discharge: 2021-05-02 | Disposition: A | Discharge status: Home / Self Care | Payer: Medicare Other | Attending: Emergency Medicine | Admitting: Emergency Medicine

## 2021-05-02 ENCOUNTER — Other Ambulatory Visit: Payer: Self-pay

## 2021-05-02 DIAGNOSIS — R220 Localized swelling, mass and lump, head: Secondary | ICD-10-CM | POA: Insufficient documentation

## 2021-05-02 DIAGNOSIS — Z4800 Encounter for change or removal of nonsurgical wound dressing: Secondary | ICD-10-CM | POA: Insufficient documentation

## 2021-05-02 DIAGNOSIS — J3489 Other specified disorders of nose and nasal sinuses: Secondary | ICD-10-CM | POA: Insufficient documentation

## 2021-05-02 DIAGNOSIS — R0981 Nasal congestion: Secondary | ICD-10-CM | POA: Diagnosis not present

## 2021-05-02 NOTE — Discharge Instructions (Signed)
You will need to follow-up with a dermatologist at your appointment on the 25th, do not pick at this ?

## 2021-05-02 NOTE — ED Provider Notes (Signed)
?[facility] ?Provider Note ? ? ?CSN: [id number] ?Arrival date & time: 05/02/21  1121 ? ?  ? ?History ? ?Chief Complaint  ?Patient presents with  ? Wound Check  ? ? ?Jacob Reed is a 56 y.o. male. ? ? ?Wound Check ? ?This patient presents for an evaluation of a mass on the left side of his nose, he has already been seen by his doctor within the last 10 days for the same thing and referred to dermatology, he is seeing a dermatologist within 48 hours, he cannot tell me exactly why he came today other than he is tired of having this thing there and "nobody cares about it".  The patient does endorse and confirms that he does have a dermatologist appointment within 48 hours.  This does not bleed unless you pick at it and it does not hurt.  It has been there for quite some time ?  ? ?Home Medications ?Prior to Admission medications   ?Medication Sig Start Date End Date Taking? Authorizing Provider  ?acetaminophen (TYLENOL) 500 MG tablet Take 500 mg by mouth daily as needed for moderate pain.    [provider]  ?fluticasone (FLONASE) 50 MCG/ACT nasal spray Place 2 sprays into both nostrils daily. ?Patient not taking: Reported on 04/23/2021 10/21/20   Lindell Spar, MD  ?loratadine (CLARITIN) 10 MG tablet Take 10 mg by mouth daily.    [provider]  ?traZODone (DESYREL) 50 MG tablet TAKE (1) TABLET BY MOUTH AT BEDTIME. 02/15/21   Lindell Spar, MD  ?   ? ?Allergies    ?Tramadol   ? ?Review of Systems   ?Review of Systems  ?Constitutional:  Negative for fever.  ?Skin:  Positive for rash.  ?Hematological:  Does not bruise/bleed easily.  ? ?Physical Exam ?Updated Vital Signs ?BP (!) 141/88 (BP Location: Left Arm)   Pulse 89   Temp 98.2 ?F (36.8 ?C) (Oral)   Resp 18   Ht 1.88 m ('6\' 2"'$ )   Wt 81.6 kg   SpO2 99%   BMI 23.11 kg/m?  ?Physical Exam ?Vitals and nursing note reviewed.  ?Constitutional:   ?   Appearance: He is well-developed. He is not diaphoretic.  ?HENT:  ?   Head:  Normocephalic and atraumatic.  ?   Nose:  ?   Comments: Approximately 5 to 6 mm in diameter lesion to the left side of the nose, center is somewhat necrotic, pearly undulating border ?Eyes:  ?   General:     ?   Right eye: No discharge.     ?   Left eye: No discharge.  ?   Conjunctiva/sclera: Conjunctivae normal.  ?Pulmonary:  ?   Effort: Pulmonary effort is normal. No respiratory distress.  ?Skin: ?   General: Skin is warm and dry.  ?   Findings: No erythema or rash.  ?Neurological:  ?   Mental Status: He is alert.  ?   Coordination: Coordination normal.  ? ? ?ED Results / Procedures / Treatments   ?Labs ?(all labs ordered are listed, but only abnormal results are displayed) ?Labs Reviewed - No data to display ? ?EKG ?None ? ?Radiology ?No results found. ? ?Procedures ?Procedures  ? ? ?Medications Ordered in ED ?Medications - No data to display ? ?ED Course/ Medical Decision Making/ A&P ?  ?                        ?Medical Decision  Making ? ?Inside of the nose is normal, no active bleeding, vital signs unremarkable, patient has follow-up with dermatology, I have nothing more to add to this patient's care today.  Stable for discharge.  He is aware that this may be cancer and he needs to see the dermatologist without fail and again he confirms that he already has an appointment for within the 48 hours on the 25th ? ? ? ? ? ? ? ?Final Clinical Impression(s) / ED Diagnoses ?Final diagnoses:  ?Mass of nose  ? ? ?Rx / DC Orders ?ED Discharge Orders   ? ? None  ? ?  ? ? ?  ?Noemi Chapel, MD ?05/02/21 1137 ? ?

## 2021-05-02 NOTE — ED Triage Notes (Signed)
Patient wants to be seen for wound to left side of nose. Per patient "has been there for quit while." Per patient some bleeding at times. Denies any other type of drainage or fever. Patient states has appointment for 4/25 with Dr Nevada Crane.  ?

## 2021-05-19 DIAGNOSIS — L720 Epidermal cyst: Secondary | ICD-10-CM | POA: Diagnosis not present

## 2021-05-31 ENCOUNTER — Ambulatory Visit: Payer: Medicare Other | Admitting: Internal Medicine

## 2021-06-17 ENCOUNTER — Encounter: Payer: Self-pay | Admitting: Internal Medicine

## 2021-06-17 ENCOUNTER — Ambulatory Visit (INDEPENDENT_AMBULATORY_CARE_PROVIDER_SITE_OTHER): Payer: Medicare Other | Admitting: Internal Medicine

## 2021-06-17 VITALS — BP 108/76 | HR 73 | Resp 18 | Ht 74.0 in | Wt 181.6 lb

## 2021-06-17 DIAGNOSIS — F5101 Primary insomnia: Secondary | ICD-10-CM

## 2021-06-17 DIAGNOSIS — F418 Other specified anxiety disorders: Secondary | ICD-10-CM

## 2021-06-17 DIAGNOSIS — E782 Mixed hyperlipidemia: Secondary | ICD-10-CM

## 2021-06-17 DIAGNOSIS — E559 Vitamin D deficiency, unspecified: Secondary | ICD-10-CM | POA: Diagnosis not present

## 2021-06-17 NOTE — Patient Instructions (Signed)
Please take Vitamin D 2000 IU once daily.  Please continue taking other medications as prescribed.

## 2021-06-17 NOTE — Assessment & Plan Note (Signed)
Advised to take vitamin D 2000 IU daily

## 2021-06-17 NOTE — Progress Notes (Signed)
Established Patient Office Visit  Subjective:  Patient ID: Jacob Reed, male    DOB: [birth year]  Age: 56 y.o. MRN: [id number]  CC:  Chief Complaint  Patient presents with   Follow-up    Pt needs labs ordered and a check up     HPI Jacob Reed is a 56 y.o. male with past medical history of diffuse OA, DDD of lumbar spine and depression with anxiety who presents for f/u of his chronic medical conditions.  He had moved to [place] and have come back now.  He takes Trazodone for insomnia. Denies any SI or HI.  He has not started Vitamin D supplement yet.  He asks for STD screening, but states that he is not sexually active currently.  Last STDs screening was done in 06/22. I explained that STD screening is not indicated if he has not been sexually active.     Past Medical History:  Diagnosis Date   Anxiety    Arthritis    Chronic neck and back pain    Dental caries    Sinusitis     Past Surgical History:  Procedure Laterality Date   INGUINAL HERNIA REPAIR Right 10/01/2012   Procedure: HERNIA REPAIR INGUINAL ADULT;  Surgeon: Scherry Ran, MD;  Location: [facility];  Service: General;  Laterality: Right;   TOOTH EXTRACTION     VASECTOMY      Family History  Problem Relation Age of Onset   Arthritis Mother    Heart disease Father    Arthritis Father     Social History   Socioeconomic History   Marital status: Legally Separated    Spouse name: Not on file   Number of children: Not on file   Years of education: Not on file   Highest education level: Not on file  Occupational History   Not on file  Tobacco Use   Smoking status: Former   Smokeless tobacco: Current    Types: Snuff  Vaping Use   Vaping Use: Never used  Substance and Sexual Activity   Alcohol use: No   Drug use: No   Sexual activity: Yes  Other Topics Concern   Not on file  Social History Narrative   Not on file   Social Determinants of Health   Financial Resource Strain: Low Risk   (11/11/2020)   Overall Financial Resource Strain (CARDIA)    Difficulty of Paying Living Expenses: Not hard at all  Food Insecurity: No Food Insecurity (11/11/2020)   Hunger Vital Sign    Worried About Running Out of Food in the Last Year: Never true    [place] in the Last Year: Never true  Transportation Needs: Unmet Transportation Needs (11/11/2020)   PRAPARE - Transportation    Lack of Transportation (Medical): Yes    Lack of Transportation (Non-Medical): Yes  Physical Activity: Sufficiently Active (11/11/2020)   Exercise Vital Sign    Days of Exercise per Week: 5 days    Minutes of Exercise per Session: 30 min  Stress: No Stress Concern Present (11/11/2020)   [place]    Feeling of Stress : Not at all  Social Connections: Socially Integrated (11/11/2020)   Social Connection and Isolation Panel [NHANES]    Frequency of Communication with Friends and Family: Three times a week    Frequency of Social Gatherings with Friends and Family: Three times a week    Attends Religious Services: 1 to  4 times per year    Active Member of Clubs or Organizations: Yes    Attends Archivist Meetings: 1 to 4 times per year    Marital Status: Married  Human resources officer Violence: Not At Risk (11/11/2020)   Humiliation, Afraid, Rape, and Kick questionnaire    Fear of Current or Ex-Partner: No    Emotionally Abused: No    Physically Abused: No    Sexually Abused: No    Outpatient Medications Prior to Visit  Medication Sig Dispense Refill   acetaminophen (TYLENOL) 500 MG tablet Take 500 mg by mouth daily as needed for moderate pain.     fluticasone (FLONASE) 50 MCG/ACT nasal spray Place 2 sprays into both nostrils daily. 16 g 6   loratadine (CLARITIN) 10 MG tablet Take 10 mg by mouth daily.     traZODone (DESYREL) 50 MG tablet TAKE (1) TABLET BY MOUTH AT BEDTIME. 90 tablet 1   No facility-administered medications prior  to visit.    Allergies  Allergen Reactions   Tramadol Hives    ROS Review of Systems  Constitutional:  Negative for chills and fever.  HENT:  Negative for congestion and sore throat.   Eyes:  Negative for pain and discharge.  Respiratory:  Negative for cough and shortness of breath.   Cardiovascular:  Negative for chest pain and palpitations.  Gastrointestinal:  Negative for diarrhea, nausea and vomiting.  Endocrine: Negative for polydipsia and polyuria.  Genitourinary:  Negative for dysuria and hematuria.  Musculoskeletal:  Positive for arthralgias and back pain. Negative for neck pain and neck stiffness.  Neurological:  Negative for dizziness, weakness, numbness and headaches.  Psychiatric/Behavioral:  Positive for sleep disturbance. Negative for agitation and behavioral problems. The patient is nervous/anxious.       Objective:    Physical Exam Vitals reviewed.  Constitutional:      General: He is not in acute distress.    Appearance: He is not diaphoretic.  HENT:     Head: Normocephalic and atraumatic.     Nose: Nose normal.     Mouth/Throat:     Mouth: Mucous membranes are moist.  Eyes:     General: No scleral icterus.    Extraocular Movements: Extraocular movements intact.  Cardiovascular:     Rate and Rhythm: Normal rate and regular rhythm.     Pulses: Normal pulses.     Heart sounds: Normal heart sounds. No murmur heard. Pulmonary:     Breath sounds: Normal breath sounds. No wheezing or rales.  Musculoskeletal:     Cervical back: Neck supple. No tenderness.     Right lower leg: No edema.     Left lower leg: No edema.  Skin:    General: Skin is warm.     Comments: Keratotic lesion over right hand  Neurological:     General: No focal deficit present.     Mental Status: He is alert and oriented to person, place, and time.     Sensory: No sensory deficit.     Motor: No weakness.  Psychiatric:        Mood and Affect: Mood normal.        Behavior: Behavior  normal.     BP 108/76 (BP Location: Right Arm, Patient Position: Sitting, Cuff Size: Normal)   Pulse 73   Resp 18   Ht '6\' 2"'  (1.88 m)   Wt 181 lb 9.6 oz (82.4 kg)   SpO2 97%   BMI 23.32 kg/m  Wt Readings  from Last 3 Encounters:  06/17/21 181 lb 9.6 oz (82.4 kg)  05/02/21 180 lb (81.6 kg)  04/23/21 178 lb 12.8 oz (81.1 kg)    Lab Results  Component Value Date   TSH 0.695 01/27/2021   Lab Results  Component Value Date   WBC 8.4 01/27/2021   HGB 15.1 01/27/2021   HCT 45.1 01/27/2021   MCV 90 01/27/2021   PLT 211 01/27/2021   Lab Results  Component Value Date   NA 139 01/27/2021   K 5.1 01/27/2021   CO2 21 01/27/2021   GLUCOSE 96 01/27/2021   BUN 13 01/27/2021   CREATININE 1.10 01/27/2021   BILITOT 0.4 01/27/2021   ALKPHOS 117 01/27/2021   AST 24 01/27/2021   ALT 16 01/27/2021   PROT 6.9 01/27/2021   ALBUMIN 4.4 01/27/2021   CALCIUM 8.8 01/27/2021   ANIONGAP 8 11/02/2016   EGFR 79 01/27/2021   Lab Results  Component Value Date   CHOL 139 01/27/2021   Lab Results  Component Value Date   HDL 41 01/27/2021   Lab Results  Component Value Date   LDLCALC 46 01/27/2021   Lab Results  Component Value Date   TRIG 347 (H) 01/27/2021   Lab Results  Component Value Date   CHOLHDL 3.4 01/27/2021   Lab Results  Component Value Date   HGBA1C 5.9 (H) 01/27/2021      Assessment & Plan:   Problem List Items Addressed This Visit       Other   Depression with anxiety - Primary    Advised to take trazodone as prescribed Would avoid starting BZDs previous history of inadvertent use according to chart review.      Insomnia    Needs to take trazodone as prescribed      Vitamin D deficiency    Advised to take vitamin D 2000 IU daily      Mixed hyperlipidemia    Lipid profile reviewed, elevated TG Diet modification and moderate exercise advised       No orders of the defined types were placed in this encounter.   Follow-up: Return if symptoms  worsen or fail to improve.    Lindell Spar, MD

## 2021-06-17 NOTE — Assessment & Plan Note (Signed)
Advised to take trazodone as prescribed Would avoid starting BZDs previous history of inadvertent use according to chart review.

## 2021-06-17 NOTE — Assessment & Plan Note (Signed)
Lipid profile reviewed, elevated TG Diet modification and moderate exercise advised

## 2021-06-17 NOTE — Assessment & Plan Note (Signed)
Needs to take trazodone as prescribed

## 2021-08-02 ENCOUNTER — Emergency Department (HOSPITAL_COMMUNITY)
Admission: EM | Admit: 2021-08-02 | Discharge: 2021-08-02 | Disposition: A | Discharge status: Home / Self Care | Payer: Medicare Other | Attending: Emergency Medicine | Admitting: Emergency Medicine

## 2021-08-02 ENCOUNTER — Encounter (HOSPITAL_COMMUNITY): Payer: Self-pay | Admitting: Emergency Medicine

## 2021-08-02 ENCOUNTER — Emergency Department (HOSPITAL_COMMUNITY): Payer: Medicare Other

## 2021-08-02 ENCOUNTER — Other Ambulatory Visit: Payer: Self-pay

## 2021-08-02 DIAGNOSIS — W230XXA Caught, crushed, jammed, or pinched between moving objects, initial encounter: Secondary | ICD-10-CM | POA: Diagnosis not present

## 2021-08-02 DIAGNOSIS — Z041 Encounter for examination and observation following transport accident: Secondary | ICD-10-CM | POA: Diagnosis not present

## 2021-08-02 DIAGNOSIS — S6992XA Unspecified injury of left wrist, hand and finger(s), initial encounter: Secondary | ICD-10-CM | POA: Insufficient documentation

## 2021-08-02 DIAGNOSIS — Z5321 Procedure and treatment not carried out due to patient leaving prior to being seen by health care provider: Secondary | ICD-10-CM | POA: Insufficient documentation
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
## 2023-08-21 NOTE — Congregational Nurse Program (Signed)
  Dept: [phone]   Congregational Nurse Program Note  Date of Encounter: 08/21/2023  Past Medical History: Past Medical History:  Diagnosis Date   Anxiety    Arthritis    Chronic neck and back pain    Dental caries    Sinusitis     Encounter Details:  Community Questionnaire - 08/21/23 1102       Questionnaire   Ask client: Do you give verbal consent for me to treat you today? Yes    Student Assistance N/A    Location Patient Information systems manager, Insurance claims handler    Encounter Setting CN site    Population Status Unknown    Insurance Medicaid;Medicare    Insurance/Financial Assistance Referral N/A    Medication N/A    Medical Provider No    Screening Referrals Made N/A    Medical Referrals Made [facility]    Medical Appointment Completed N/A    CNP Interventions Advocate/Support    Screenings CN Performed Blood Pressure    ED Visit Averted N/A    Life-Saving Intervention Made N/A          Mr Sermon into the [employer] this morning to inquire about food and clothing vouchers. He will come back during [employer] pant[employer] hours of 1245-2 M-Th and clothing Tuesdays and Thursdays   He states he is back in town and that he had been in [place] . He is renting a room nearby. He does not take any medications and he no longer has a primary care provider. He is insured with Medicare and Medicaid. Suggested [facility] that is here in town. He states he went there before. Encouraged him to go by now that he is in town. He requested his blood pressure to be checked.  Blood pressure today left arm, sitting 122/69 pulse 90 but he states he walked here today. He has no other complaints. He states he will come back for food later.  Avelina JONELLE Skeen RN [facility] [employer]
